# Patient Record
Sex: Female | Born: 1980 | Race: Black or African American | Hispanic: No | Marital: Single | Smoking: Former smoker
Health system: Southern US, Community
[De-identification: ages and names within clinical notes are randomized; demographics above are authoritative.]

## PROBLEM LIST (undated history)

## (undated) ENCOUNTER — Inpatient Hospital Stay (HOSPITAL_COMMUNITY): Payer: Self-pay

## (undated) DIAGNOSIS — Z90721 Acquired absence of ovaries, unilateral: Secondary | ICD-10-CM

## (undated) DIAGNOSIS — D573 Sickle-cell trait: Secondary | ICD-10-CM

## (undated) DIAGNOSIS — F419 Anxiety disorder, unspecified: Secondary | ICD-10-CM

## (undated) DIAGNOSIS — K429 Umbilical hernia without obstruction or gangrene: Secondary | ICD-10-CM

## (undated) DIAGNOSIS — F1210 Cannabis abuse, uncomplicated: Secondary | ICD-10-CM

## (undated) DIAGNOSIS — R188 Other ascites: Secondary | ICD-10-CM

## (undated) DIAGNOSIS — F12188 Cannabis abuse with other cannabis-induced disorder: Secondary | ICD-10-CM

## (undated) HISTORY — PX: HERNIA REPAIR: SHX51

## (undated) HISTORY — PX: DILATION AND CURETTAGE OF UTERUS: SHX78

---

## 2006-09-12 ENCOUNTER — Emergency Department (HOSPITAL_COMMUNITY): Admission: EM | Admit: 2006-09-12 | Discharge: 2006-09-12 | Payer: Self-pay | Admitting: Emergency Medicine

## 2008-07-11 ENCOUNTER — Ambulatory Visit: Payer: Self-pay | Admitting: Obstetrics and Gynecology

## 2008-07-11 ENCOUNTER — Inpatient Hospital Stay (HOSPITAL_COMMUNITY): Admission: AD | Admit: 2008-07-11 | Discharge: 2008-07-11 | Payer: Self-pay | Admitting: Obstetrics & Gynecology

## 2008-07-12 ENCOUNTER — Inpatient Hospital Stay (HOSPITAL_COMMUNITY): Admission: AD | Admit: 2008-07-12 | Discharge: 2008-07-13 | Payer: Self-pay | Admitting: Obstetrics & Gynecology

## 2008-07-17 ENCOUNTER — Inpatient Hospital Stay (HOSPITAL_COMMUNITY): Admission: AD | Admit: 2008-07-17 | Discharge: 2008-07-17 | Payer: Self-pay | Admitting: Obstetrics & Gynecology

## 2008-12-01 ENCOUNTER — Inpatient Hospital Stay (HOSPITAL_COMMUNITY): Admission: AD | Admit: 2008-12-01 | Discharge: 2008-12-01 | Payer: Self-pay | Admitting: Obstetrics & Gynecology

## 2008-12-14 ENCOUNTER — Inpatient Hospital Stay (HOSPITAL_COMMUNITY): Admission: AD | Admit: 2008-12-14 | Discharge: 2008-12-14 | Payer: Self-pay | Admitting: Obstetrics & Gynecology

## 2008-12-15 ENCOUNTER — Inpatient Hospital Stay (HOSPITAL_COMMUNITY): Admission: AD | Admit: 2008-12-15 | Discharge: 2008-12-15 | Payer: Self-pay | Admitting: Obstetrics & Gynecology

## 2008-12-16 ENCOUNTER — Other Ambulatory Visit: Payer: Self-pay | Admitting: Emergency Medicine

## 2008-12-16 ENCOUNTER — Ambulatory Visit: Payer: Self-pay | Admitting: Family Medicine

## 2008-12-16 ENCOUNTER — Inpatient Hospital Stay (HOSPITAL_COMMUNITY): Admission: AD | Admit: 2008-12-16 | Discharge: 2008-12-20 | Payer: Self-pay | Admitting: Obstetrics & Gynecology

## 2009-02-22 ENCOUNTER — Ambulatory Visit (HOSPITAL_COMMUNITY): Admission: RE | Admit: 2009-02-22 | Discharge: 2009-02-22 | Payer: Self-pay | Admitting: Obstetrics

## 2009-07-13 ENCOUNTER — Inpatient Hospital Stay (HOSPITAL_COMMUNITY): Admission: AD | Admit: 2009-07-13 | Discharge: 2009-07-15 | Payer: Self-pay | Admitting: Obstetrics

## 2010-04-24 IMAGING — US US OB COMP LESS 14 WK
1 series · 14 of 27 positions shown · non-contrast
Comparison: none

CLINICAL DATA: 27-year-old female nausea vomiting, 6 weeks pregnant

OBSTETRIC <14 WK US AND TRANSVAGINAL OB US
TECHNIQUE: Both transabdominal and transvaginal ultrasound
examinations were performed for complete evaluation of the
gestation as well as the maternal uterus, adnexal regions, and
pelvic cul-de-sac.

[Series 1: us ob comp less 14 wks · 14 of 27 slices shown]
[im 1/27]
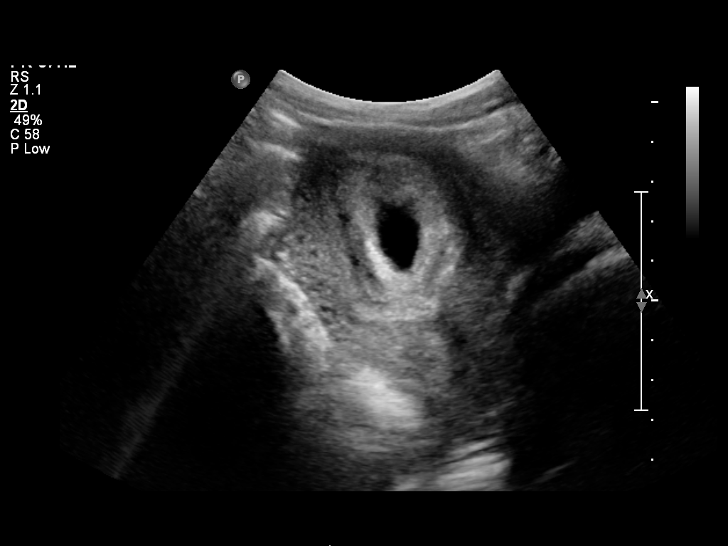
[im 3/27]
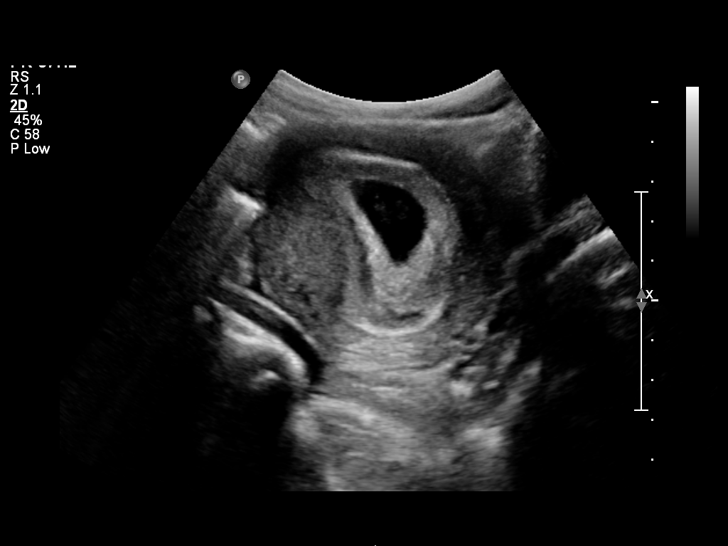
[im 5/27]
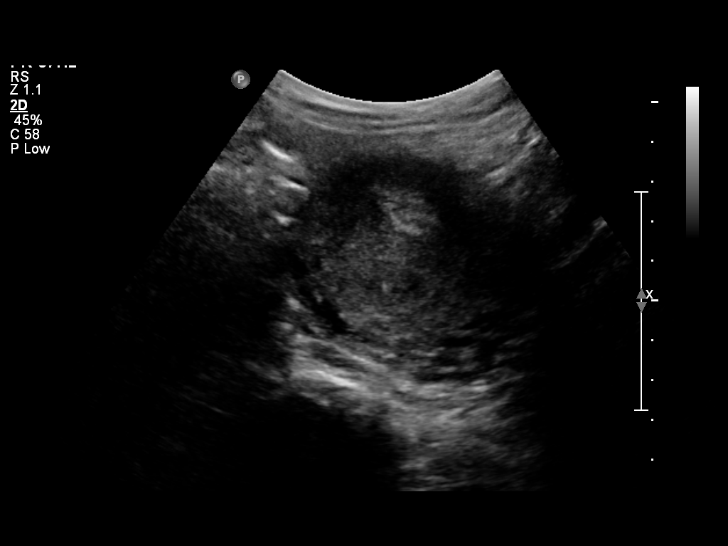
[im 7/27]
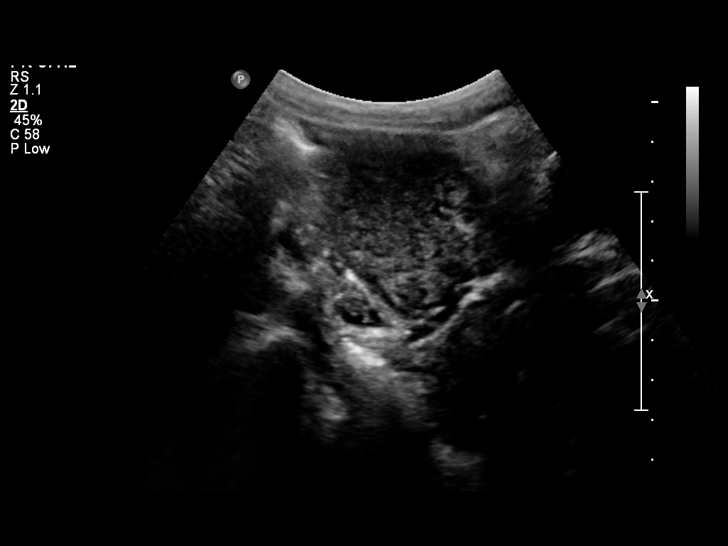
[im 9/27]
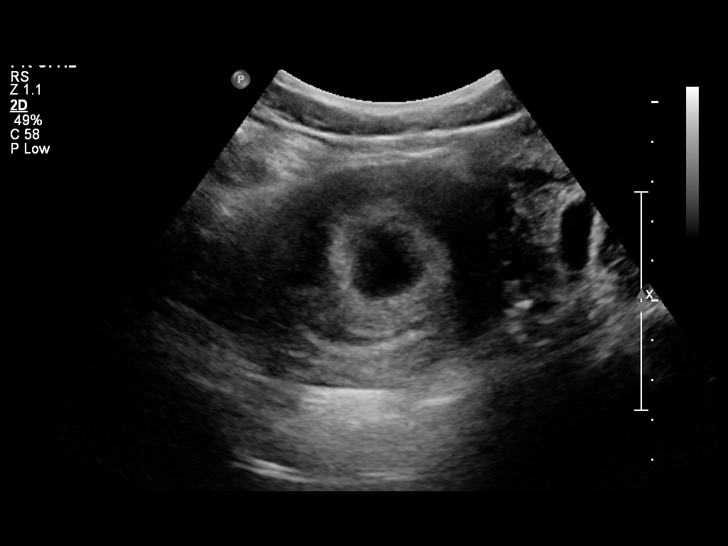
[im 11/27]
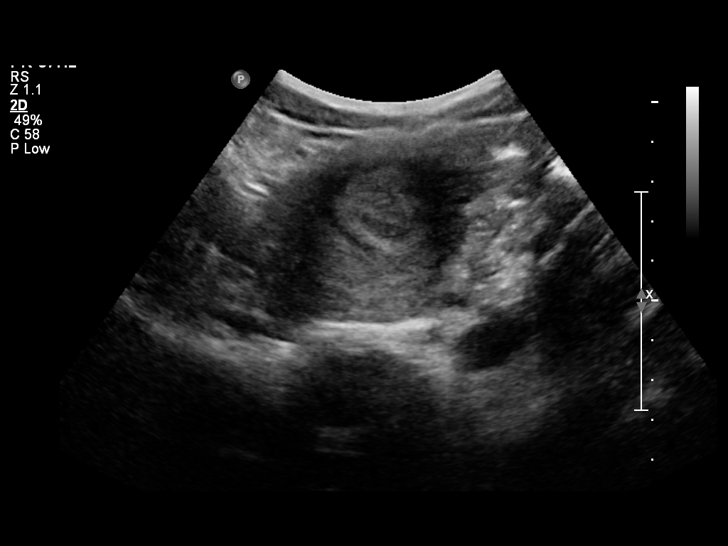
[im 13/27]
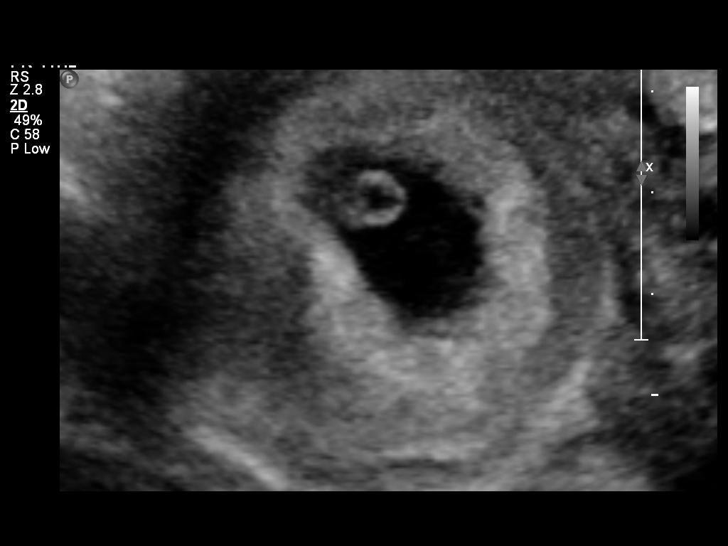
[im 15/27]
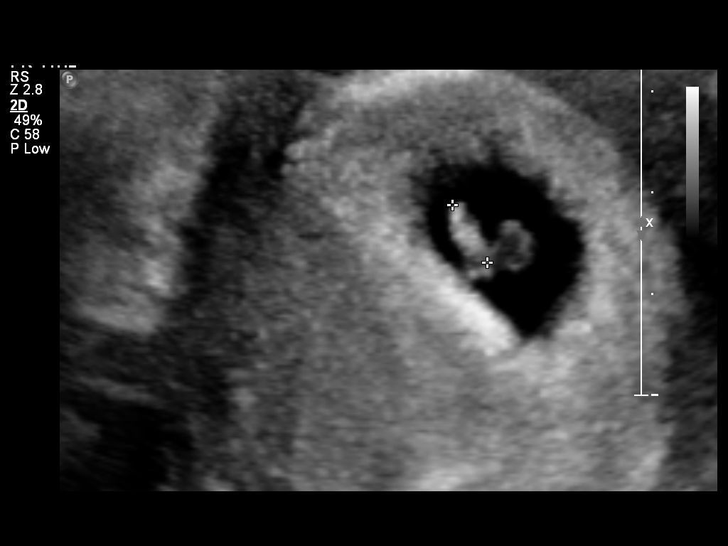
[im 17/27]
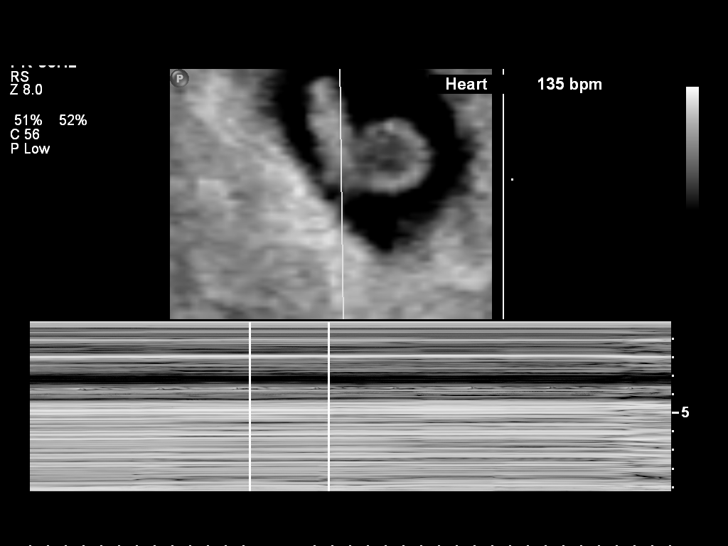
[im 19/27]
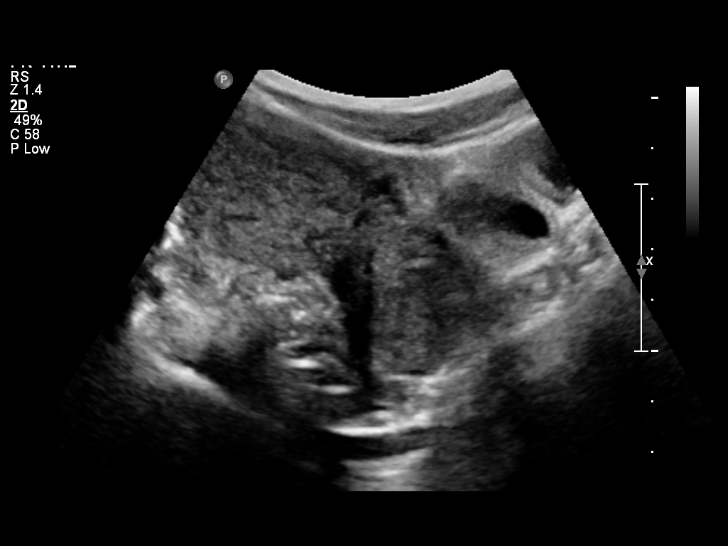
[im 21/27]
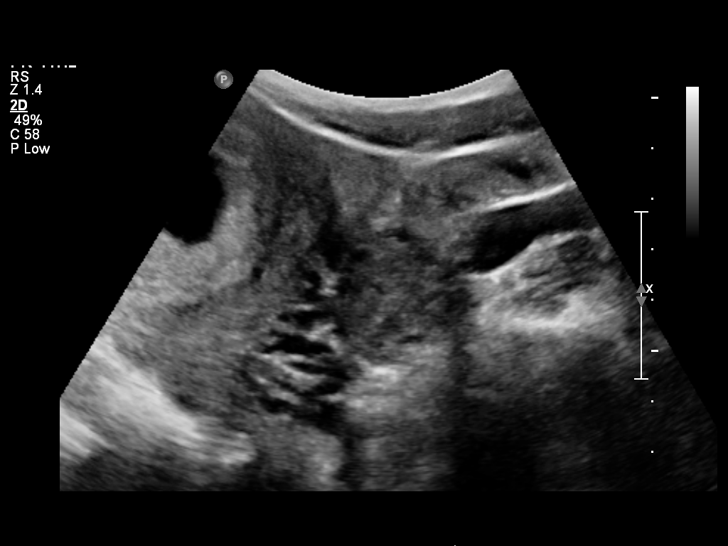
[im 23/27]
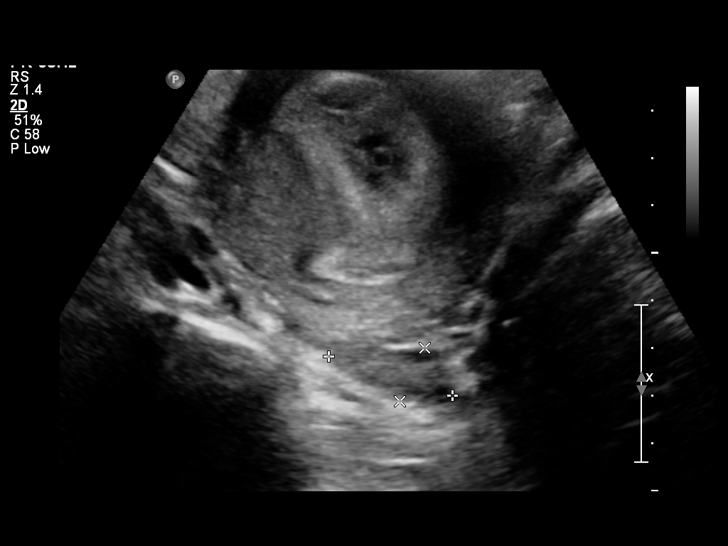
[im 25/27]
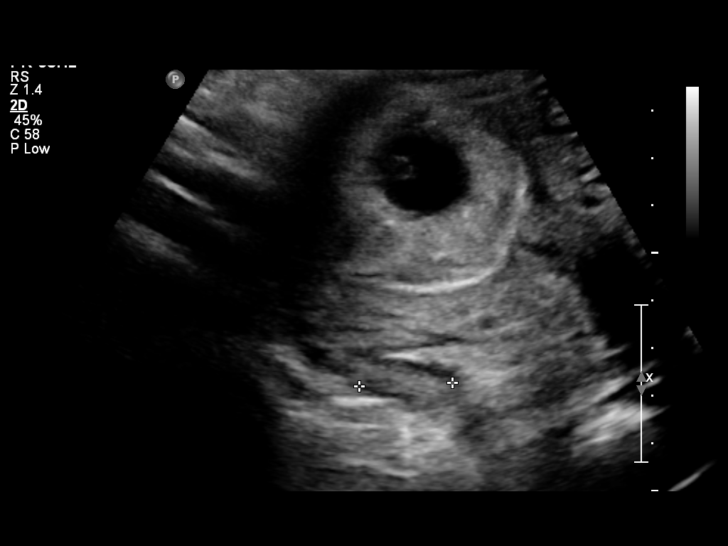
[im 27/27]
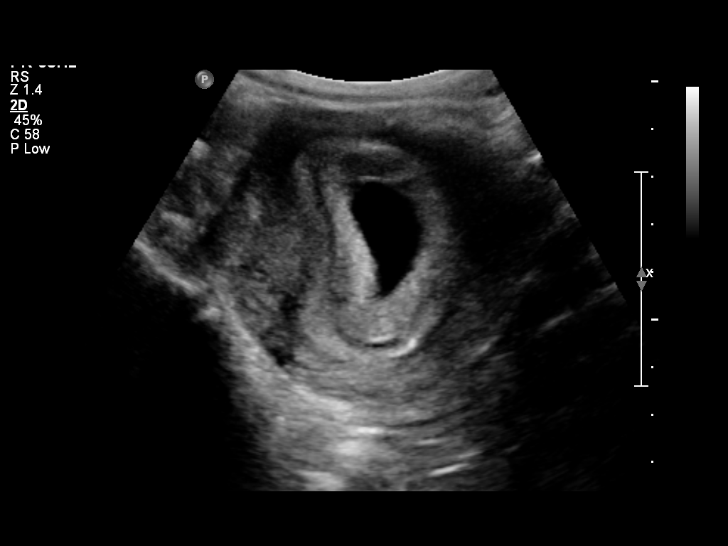

[14 of 27 positions shown; findings below may reference images not displayed]

Intrauterine gestational sac: Single
Yolk sac: Visualized
Embryo: Visualized
Cardiac Activity: Detected
Heart Rate: 135 bpm

CRL: 6.9 mm           6   w  4   d

Maternal uterus/adnexae:
A single intrauterine gestational sac is identified containing a
yolk sac and a small fetal pole.  Crown-rump length is 6.9 mm
correlating with a 6-week-4-day distention range.  Cardiac activity
was visualized during the study with a heart rate of 135 beats per
minute.  Negative for subchorionic hemorrhage.  Ovaries are normal.
No free fluid.
IMPRESSION: Single living 6-week-4-day intrauterine pregnancy.  No acute
finding by ultrasound.

## 2010-06-08 NOTE — L&D Delivery Note (Signed)
Delivery Note At  a viable and healthy female was delivered vaginally (Position: Left occiput anterior).  APGAR: 9/9 ; weight .   Placenta status: Intact, spontaneous.  Cord: 3 vessel cord,  with the following complications:none. Anesthesia:  none Episiotomy: none Lacerations: none Est. Blood Loss (mL):  Mom to postpartum.  Baby to nursery-stable.  [name] CNM 05/06/2011, 8:27 PM

## 2010-08-16 LAB — HIV ANTIBODY (ROUTINE TESTING W REFLEX): HIV: NONREACTIVE

## 2010-08-16 LAB — RPR: RPR: NONREACTIVE

## 2010-08-27 LAB — CBC
HCT: 35.5 % — ABNORMAL LOW (ref 36.0–46.0)
HCT: 40.2 % (ref 36.0–46.0)
Hemoglobin: 12 g/dL (ref 12.0–15.0)
Hemoglobin: 13.8 g/dL (ref 12.0–15.0)
MCHC: 33.9 g/dL (ref 30.0–36.0)
MCV: 96.4 fL (ref 78.0–100.0)
MCV: 96.6 fL (ref 78.0–100.0)
RBC: 3.68 MIL/uL — ABNORMAL LOW (ref 3.87–5.11)
RBC: 4.17 MIL/uL (ref 3.87–5.11)
RDW: 12.9 % (ref 11.5–15.5)
WBC: 8.2 10*3/uL (ref 4.0–10.5)

## 2010-09-13 IMAGING — US US OB COMP LESS 14 WK
1 series · 14 of 27 positions shown · non-contrast
Comparison: Pelvic ultrasound 07/12/2008

CLINICAL DATA: Vaginal bleeding, positive home pregnancy test.

OBSTETRIC <14 WK ULTRASOUND
TECHNIQUE: Transabdominal ultrasound was performed for evaluation
of the gestation as well as the maternal uterus and adnexal
regions.

[Series 1: us ob comp less 14 wks · 0.20mm/px · 14 of 27 slices shown]
[im 1/27]
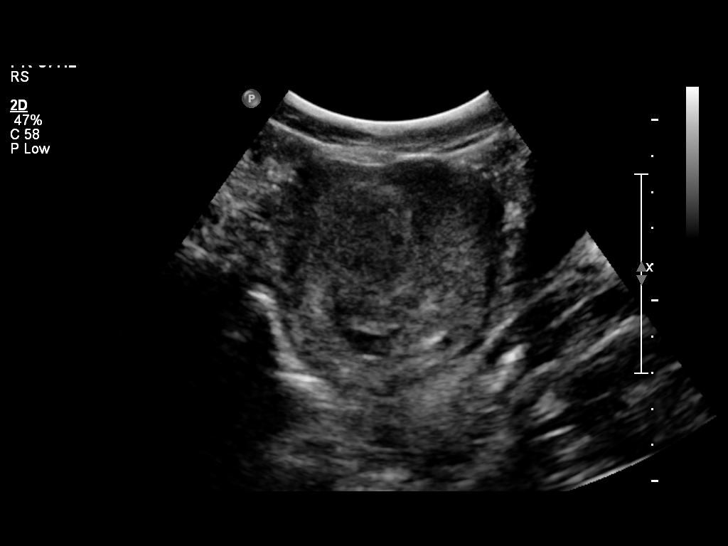
[im 3/27]
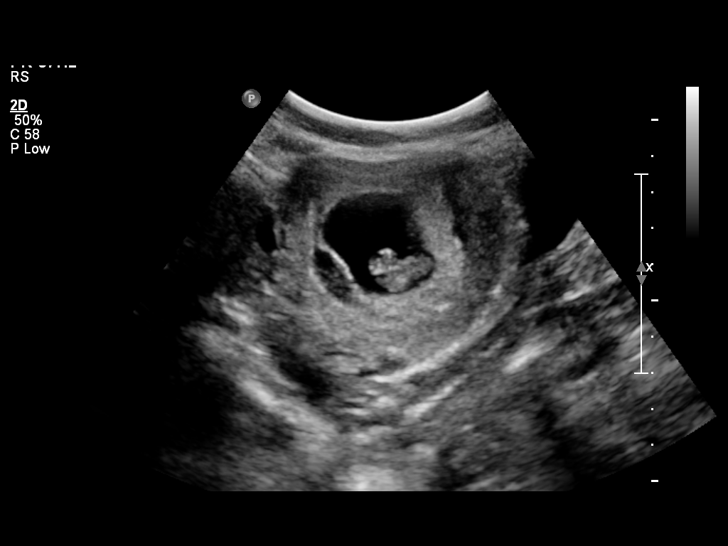
[im 5/27]
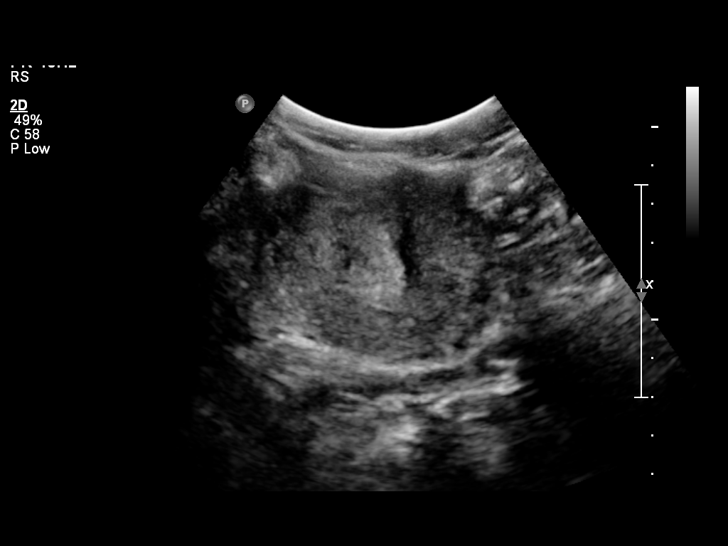
[im 7/27]
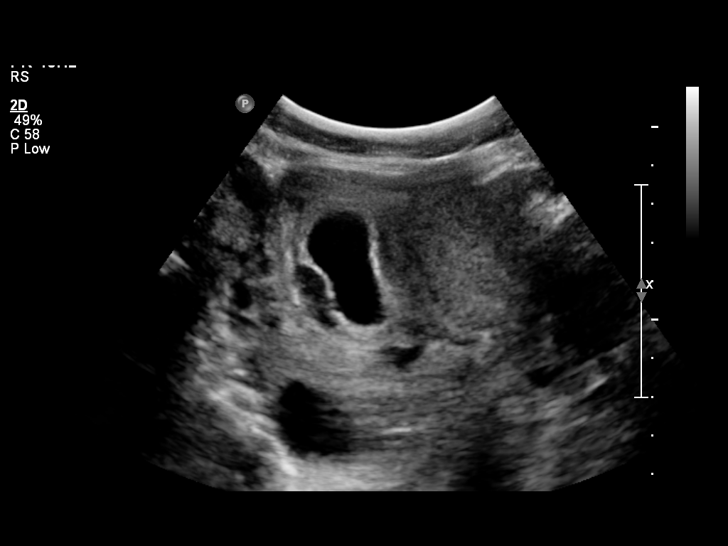
[im 9/27]
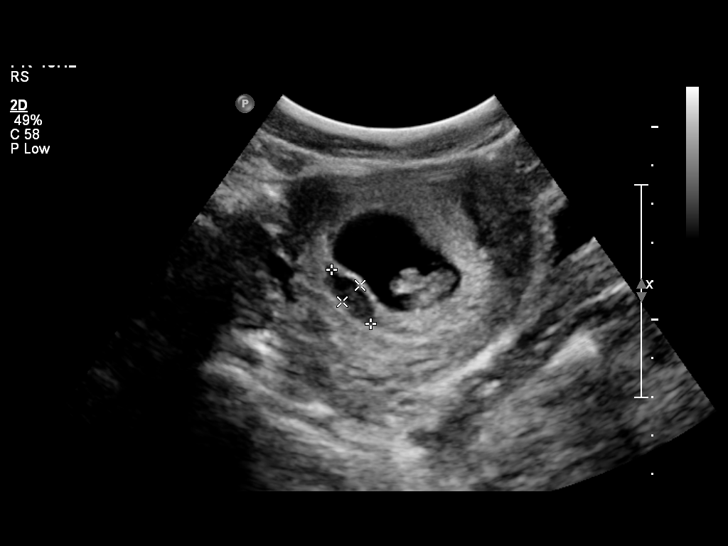
[im 11/27]
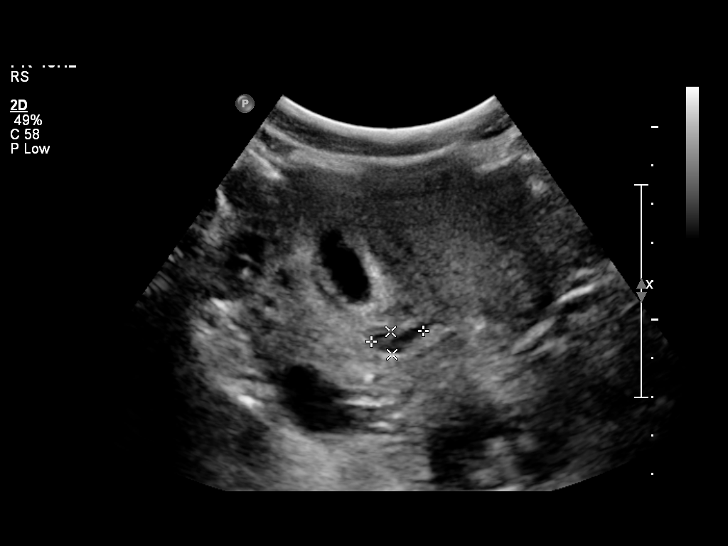
[im 13/27]
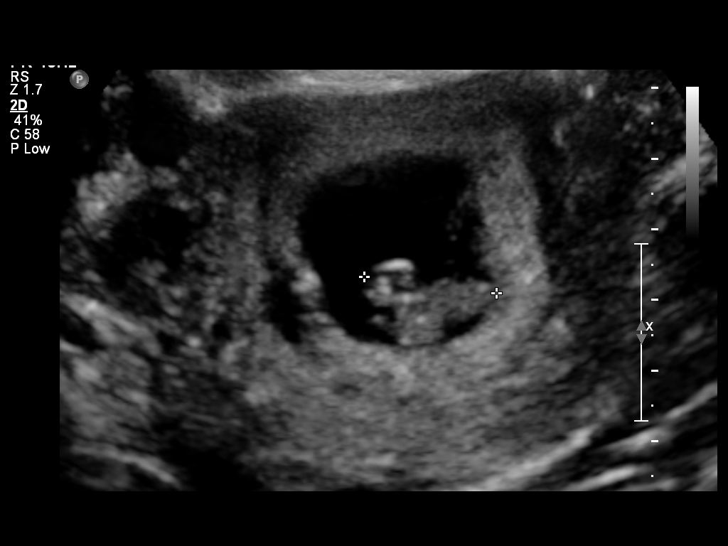
[im 15/27]
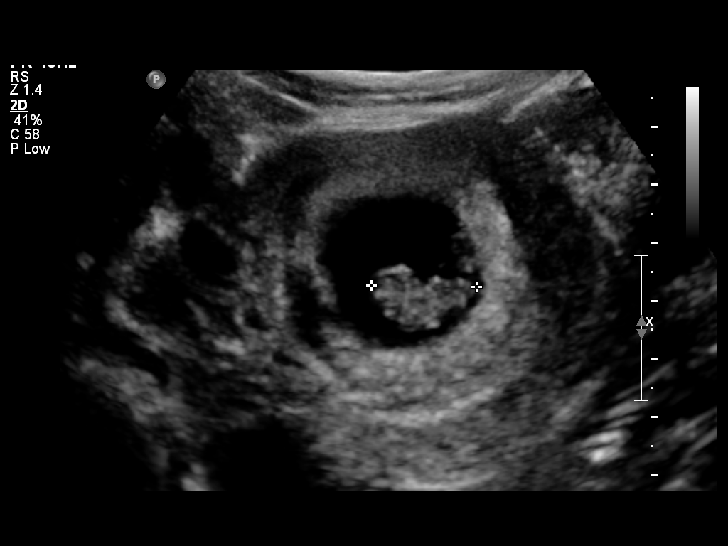
[im 17/27]
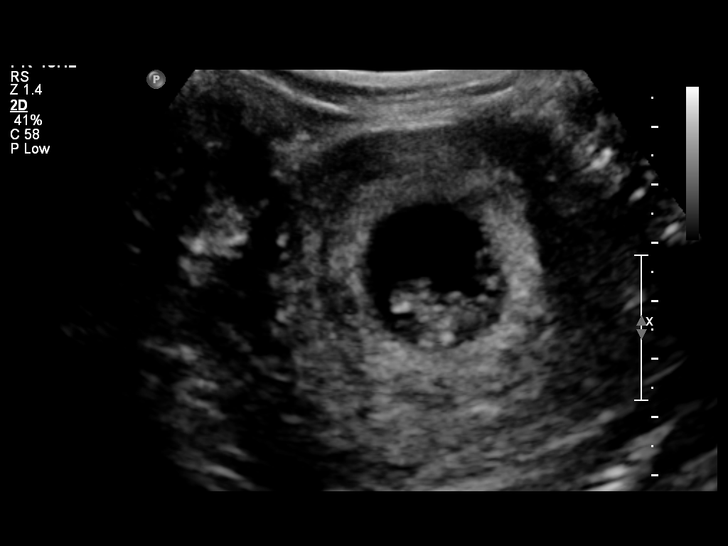
[im 19/27]
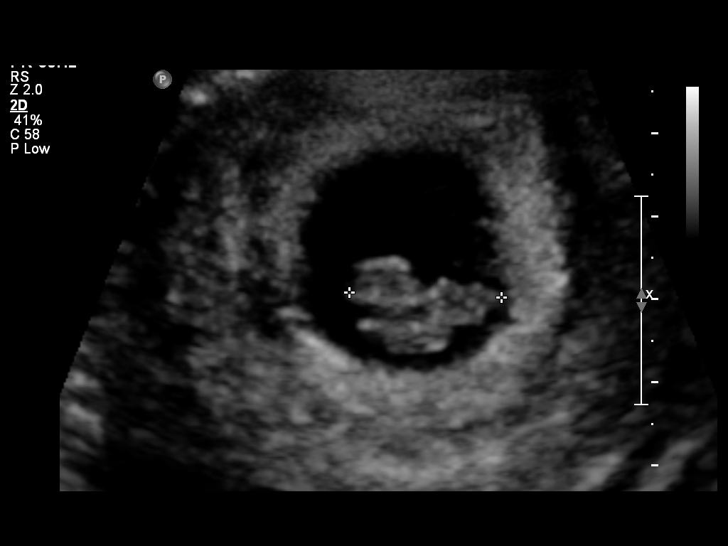
[im 21/27]
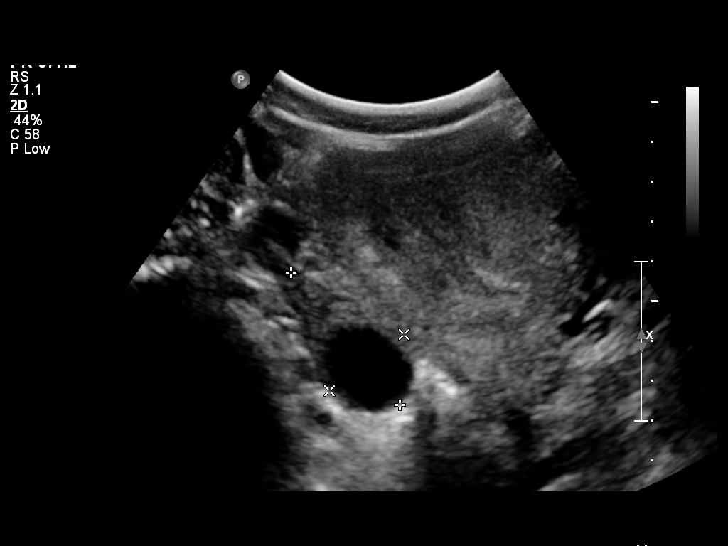
[im 23/27]
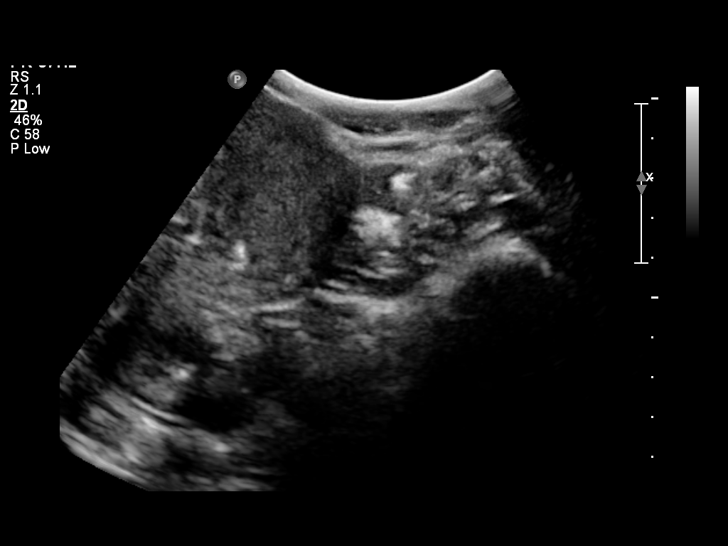
[im 25/27]
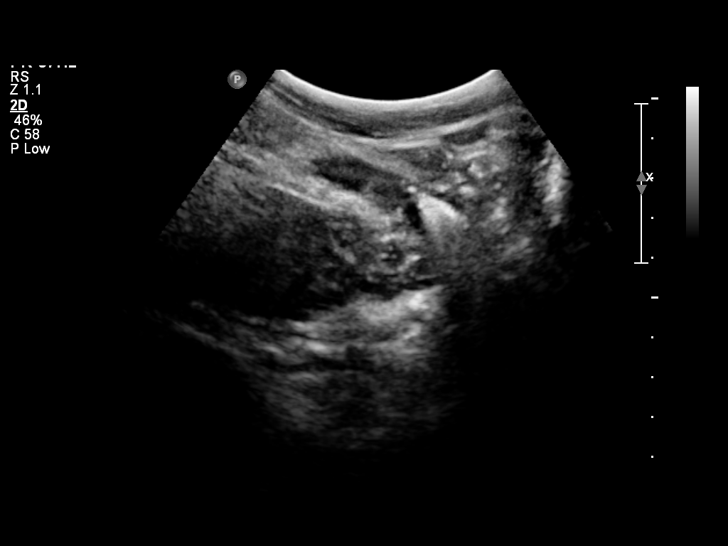
[im 27/27]
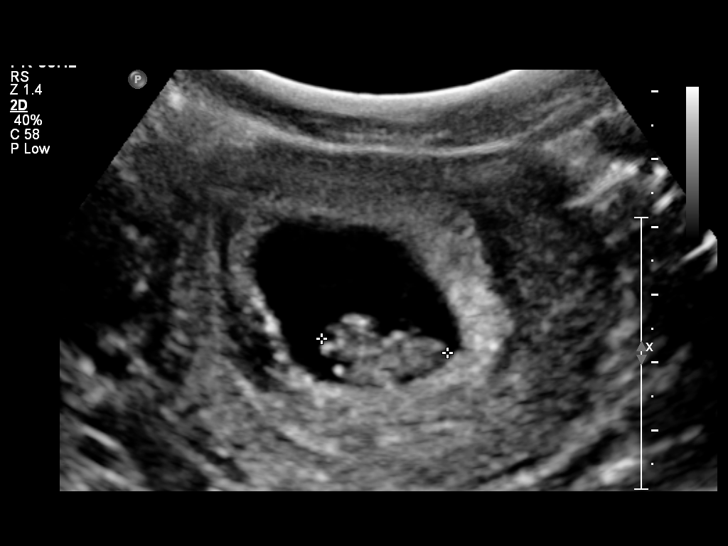

[14 of 27 positions shown; findings below may reference images not displayed]

Intrauterine gestational sac: Present
Yolk sac: Present
Embryo: Present
Cardiac Activity: Present
Heart Rate: 179 bpm

There is a small 1.7 x 0.6 cm subchorionic hemorrhage.

CRL:  18.8 mm        8w  2d

Maternal uterus/Adnexae:
A small corpus luteum noted in the right ovary.  Left ovary not
visualized.
IMPRESSION: 1.  Single intrauterine gestational sac with embryo and normal
cardiac activity.
2.  Estimated gestational age by crown-rump length equals 8 weeks 2
days.
3.  Small subchorionic hemorrhage.

## 2010-09-14 LAB — BASIC METABOLIC PANEL
BUN: 1 mg/dL — ABNORMAL LOW (ref 6–23)
BUN: <1 mg/dL — ABNORMAL LOW (ref 6–23)
CO2: 19 mEq/L (ref 19–32)
CO2: 23 mEq/L (ref 19–32)
CO2: 23 mEq/L (ref 19–32)
CO2: 24 mEq/L (ref 19–32)
CO2: 26 mEq/L (ref 19–32)
Calcium: 7.7 mg/dL — ABNORMAL LOW (ref 8.4–10.5)
Chloride: 103 mEq/L (ref 96–112)
Chloride: 104 mEq/L (ref 96–112)
Chloride: 106 mEq/L (ref 96–112)
Creatinine, Ser: 0.48 mg/dL (ref 0.4–1.2)
Creatinine, Ser: 0.59 mg/dL (ref 0.4–1.2)
GFR calc Af Amer: >60 mL/min (ref >60)
Glucose, Bld: 103 mg/dL — ABNORMAL HIGH (ref 70–99)
Glucose, Bld: 104 mg/dL — ABNORMAL HIGH (ref 70–99)
Glucose, Bld: 104 mg/dL — ABNORMAL HIGH (ref 70–99)
Glucose, Bld: 107 mg/dL — ABNORMAL HIGH (ref 70–99)
Glucose, Bld: 214 mg/dL — ABNORMAL HIGH (ref 70–99)
Potassium: 2.5 mEq/L — CL (ref 3.5–5.1)
Potassium: 2.8 mEq/L — ABNORMAL LOW (ref 3.5–5.1)
Sodium: 133 mEq/L — ABNORMAL LOW (ref 135–145)
Sodium: 136 mEq/L (ref 135–145)
Sodium: 137 mEq/L (ref 135–145)

## 2010-09-14 LAB — COMPREHENSIVE METABOLIC PANEL
ALT: 14 U/L (ref 0–35)
AST: 20 U/L (ref 0–37)
Albumin: 4.1 g/dL (ref 3.5–5.2)
Chloride: 101 mEq/L (ref 96–112)
Creatinine, Ser: 0.54 mg/dL (ref 0.4–1.2)
GFR calc Af Amer: >60 mL/min (ref >60)
Sodium: 135 mEq/L (ref 135–145)
Total Bilirubin: 0.8 mg/dL (ref 0.3–1.2)

## 2010-09-14 LAB — CBC
Hemoglobin: 11.9 g/dL — ABNORMAL LOW (ref 12.0–15.0)
MCHC: 35.4 g/dL (ref 30.0–36.0)
RDW: 12.6 % (ref 11.5–15.5)

## 2010-09-14 LAB — URINE MICROSCOPIC-ADD ON

## 2010-09-14 LAB — DIFFERENTIAL
Basophils Absolute: 0 10*3/uL (ref 0.0–0.1)
Basophils Relative: 0 % (ref 0–1)
Monocytes Absolute: 0.7 10*3/uL (ref 0.1–1.0)
Neutro Abs: 10.9 10*3/uL — ABNORMAL HIGH (ref 1.7–7.7)

## 2010-09-14 LAB — URINALYSIS, ROUTINE W REFLEX MICROSCOPIC
Bilirubin Urine: NEGATIVE
Bilirubin Urine: NEGATIVE
Glucose, UA: NEGATIVE mg/dL
Hgb urine dipstick: NEGATIVE
Ketones, ur: 15 mg/dL — AB
Ketones, ur: >80 mg/dL — AB
Nitrite: NEGATIVE
Protein, ur: 30 mg/dL — AB
Protein, ur: 30 mg/dL — AB
Specific Gravity, Urine: 1.03 (ref 1.005–1.030)
Urobilinogen, UA: 1 mg/dL (ref 0.0–1.0)

## 2010-09-14 LAB — POCT PREGNANCY, URINE: Preg Test, Ur: POSITIVE

## 2010-09-15 LAB — URINALYSIS, ROUTINE W REFLEX MICROSCOPIC
Leukocytes, UA: NEGATIVE
Nitrite: NEGATIVE
Specific Gravity, Urine: 1.02 (ref 1.005–1.030)
pH: 6.5 (ref 5.0–8.0)

## 2010-09-15 LAB — WET PREP, GENITAL
Trich, Wet Prep: NONE SEEN
Yeast Wet Prep HPF POC: NONE SEEN

## 2010-09-15 LAB — CBC
MCV: 94.1 fL (ref 78.0–100.0)
Platelets: 264 10*3/uL (ref 150–400)
RDW: 12.2 % (ref 11.5–15.5)
WBC: 9.5 10*3/uL (ref 4.0–10.5)

## 2010-09-15 LAB — ABO/RH: ABO/RH(D): O POS

## 2010-09-15 LAB — HCG, QUANTITATIVE, PREGNANCY: hCG, Beta Chain, Quant, S: 111778 m[IU]/mL — ABNORMAL HIGH (ref <5)

## 2010-09-15 LAB — URINE MICROSCOPIC-ADD ON

## 2010-09-23 LAB — URINALYSIS, ROUTINE W REFLEX MICROSCOPIC
Bilirubin Urine: NEGATIVE
Glucose, UA: NEGATIVE mg/dL
Glucose, UA: NEGATIVE mg/dL
Hgb urine dipstick: NEGATIVE
Ketones, ur: 15 mg/dL — AB
Ketones, ur: >80 mg/dL — AB
Leukocytes, UA: NEGATIVE
Leukocytes, UA: NEGATIVE
Nitrite: NEGATIVE
Specific Gravity, Urine: >1.03 — ABNORMAL HIGH (ref 1.005–1.030)
Specific Gravity, Urine: >1.03 — ABNORMAL HIGH (ref 1.005–1.030)
Urobilinogen, UA: 0.2 mg/dL (ref 0.0–1.0)
pH: 6 (ref 5.0–8.0)
pH: 6 (ref 5.0–8.0)
pH: 7.5 (ref 5.0–8.0)

## 2010-09-23 LAB — URINE MICROSCOPIC-ADD ON: RBC / HPF: NONE SEEN RBC/hpf (ref <3)

## 2010-09-23 LAB — GC/CHLAMYDIA PROBE AMP, GENITAL
Chlamydia, DNA Probe: NEGATIVE
GC Probe Amp, Genital: NEGATIVE

## 2010-09-23 LAB — WET PREP, GENITAL

## 2010-10-09 ENCOUNTER — Emergency Department (HOSPITAL_COMMUNITY)
Admission: EM | Admit: 2010-10-09 | Discharge: 2010-10-09 | Disposition: A | Discharge status: Home / Self Care | Payer: Medicaid Other | Attending: Emergency Medicine | Admitting: Emergency Medicine

## 2010-10-09 ENCOUNTER — Emergency Department (HOSPITAL_COMMUNITY): Payer: Medicaid Other

## 2010-10-09 ENCOUNTER — Other Ambulatory Visit (HOSPITAL_COMMUNITY): Payer: Self-pay

## 2010-10-09 DIAGNOSIS — O2390 Unspecified genitourinary tract infection in pregnancy, unspecified trimester: Secondary | ICD-10-CM | POA: Insufficient documentation

## 2010-10-09 DIAGNOSIS — A499 Bacterial infection, unspecified: Secondary | ICD-10-CM | POA: Insufficient documentation

## 2010-10-09 DIAGNOSIS — N760 Acute vaginitis: Secondary | ICD-10-CM | POA: Insufficient documentation

## 2010-10-09 DIAGNOSIS — O219 Vomiting of pregnancy, unspecified: Secondary | ICD-10-CM | POA: Insufficient documentation

## 2010-10-09 DIAGNOSIS — B9689 Other specified bacterial agents as the cause of diseases classified elsewhere: Secondary | ICD-10-CM | POA: Insufficient documentation

## 2010-10-09 LAB — URINALYSIS, ROUTINE W REFLEX MICROSCOPIC
Bilirubin Urine: NEGATIVE
Nitrite: NEGATIVE
Specific Gravity, Urine: 1.028 (ref 1.005–1.030)
Urobilinogen, UA: 1 mg/dL (ref 0.0–1.0)
pH: 6 (ref 5.0–8.0)

## 2010-10-09 LAB — POCT I-STAT, CHEM 8
Chloride: 105 mEq/L (ref 96–112)
Glucose, Bld: 83 mg/dL (ref 70–99)
HCT: 39 % (ref 36.0–46.0)
Hemoglobin: 13.3 g/dL (ref 12.0–15.0)
Potassium: 3.7 mEq/L (ref 3.5–5.1)
Sodium: 137 mEq/L (ref 135–145)

## 2010-10-09 LAB — WET PREP, GENITAL

## 2010-10-09 LAB — HCG, QUANTITATIVE, PREGNANCY: hCG, Beta Chain, Quant, S: 73939 m[IU]/mL — ABNORMAL HIGH (ref <5)

## 2010-10-10 LAB — GC/CHLAMYDIA PROBE AMP, GENITAL
Chlamydia, DNA Probe: NEGATIVE
GC Probe Amp, Genital: NEGATIVE

## 2010-10-21 NOTE — Discharge Summary (Signed)
Veronica Green, Veronica Green                ACCOUNT NO.:  [id number]   MEDICAL RECORD NO.:  [id number]          PATIENT TYPE:  INP   LOCATION:  9305                          FACILITY:  [facility]   PHYSICIAN:  Lesly Dukes, M.D. DATE OF BIRTH:  Mar 21, 1981   DATE OF ADMISSION:  12/16/2008  DATE OF DISCHARGE:  12/20/2008                               DISCHARGE SUMMARY   DISCHARGE DIAGNOSES:  1. Hyperemesis gravidarum.  2. Intrauterine pregnancy at 54 weeks' gestational age.  3. Hypokalemia.  4. Constipation.   REASON FOR ADMISSION:  [name] is a 30 year old, gravida 3,  para 1, admitted at 9 plus weeks' gestational age with persistent  nausea, vomiting, and inability to tolerate p.o.'s.  On admission, she  had marked ketones on her urinalysis and she had a serum potassium of  2.8.  The patient was admitted for IV hydration, medical management of  her hyperemesis, as well as potassium replacement.   HOSPITAL COURSE:  The patient was admitted, given IV fluids, initially  with glucose and then just regular lactated Ringer solution.  She also  had received IV as well as p.o. potassium replacement, and she was  treated with Reglan, Zofran, scopolamine, and Robamol.  The patient  refused her meds on occasion in the hospital, and she felt that the best  meds for her were the Zofran.  Additionally, steroids were started on  hospital day #3 which the patient found to be markedly helpful.  Shortly  after starting steroids, the patient's diet was advanced, and she was  tolerating both liquids and solids.  Of note during the hospitalization,  she received a psychiatry consult due to her history of anxiety.  The  diagnosis was a somatoform disorder and anxiety disorder, and the  recommendations were psychotherapy.  At the time of discharge, the  patient is in much better spirits.  She is tolerating p.o.'s as stated  above and is eager to go home.   MEDICATIONS AT DISCHARGE:  1. Zofran oral  dissolving tablets 8 mg.  2. Methylprednisolone a 2-week-long taper as per the instructions      given to her.  3. Colace over the counter twice daily.  4. MiraLax over the counter once daily.   INSTRUCTIONS TO THE PATIENT:  The patient was instructed to very slowly  advance her diet as well as stick to small portions of liquid and solid.  Additionally, she was instructed on the use of her medications.  The  patient does have an appointment at the health department in  approximately 1-1/2 weeks.  She was instructed to follow up with them or  followup in the MAU sooner if she has further problems.  Additionally,  we will have social worker see the patient today to try to range some  beginning outpatient psychotherapy.   PROCEDURES:  None.   FINDINGS:  As per above.   CONDITION ON DISCHARGE:  Good.   DISPOSITION:  The patient discharged to home.      Odie Sera, DO      ______________________________  Lesly Dukes, M.D.  MC/MEDQ  D:  12/20/2008  T:  12/20/2008  Job:  [id number]

## 2011-03-17 LAB — ANTIBODY SCREEN: Antibody Screen: NEGATIVE

## 2011-04-20 LAB — STREP B DNA PROBE: GBS: NEGATIVE

## 2011-05-06 ENCOUNTER — Encounter (HOSPITAL_COMMUNITY): Payer: Self-pay | Admitting: *Deleted

## 2011-05-06 ENCOUNTER — Inpatient Hospital Stay (HOSPITAL_COMMUNITY)
Admission: AD | Admit: 2011-05-06 | Discharge: 2011-05-08 | DRG: 775 | Disposition: A | Discharge status: Home / Self Care | Payer: Medicaid Other | Source: Ambulatory Visit | Attending: Obstetrics | Admitting: Obstetrics

## 2011-05-06 DIAGNOSIS — O4290 Premature rupture of membranes, unspecified as to length of time between rupture and onset of labor, unspecified weeks of gestation: Principal | ICD-10-CM | POA: Diagnosis present

## 2011-05-06 LAB — CBC
MCH: 31.3 pg (ref 26.0–34.0)
MCHC: 35.1 g/dL (ref 30.0–36.0)
MCV: 88.9 fL (ref 78.0–100.0)
Platelets: 247 10*3/uL (ref 150–400)
RDW: 12.3 % (ref 11.5–15.5)

## 2011-05-06 MED ORDER — BENZOCAINE-MENTHOL 20-0.5 % EX AERO
1.0000 "application " | INHALATION_SPRAY | CUTANEOUS | Status: DC | PRN
  Administered 2011-05-06: 1 via TOPICAL

## 2011-05-06 MED ORDER — OXYCODONE-ACETAMINOPHEN 5-325 MG PO TABS: 2.0000 | ORAL_TABLET | ORAL | Status: DC | PRN

## 2011-05-06 MED ORDER — LIDOCAINE HCL (PF) 1 % IJ SOLN
30.0000 mL | INTRAMUSCULAR | Status: DC | PRN
  Filled 2011-05-06: qty 30

## 2011-05-06 MED ORDER — LANOLIN HYDROUS EX OINT: TOPICAL_OINTMENT | CUTANEOUS | Status: DC | PRN

## 2011-05-06 MED ORDER — DIBUCAINE 1 % RE OINT: 1.0000 "application " | TOPICAL_OINTMENT | RECTAL | Status: DC | PRN

## 2011-05-06 MED ORDER — LACTATED RINGERS IV SOLN
INTRAVENOUS | Status: DC
  Administered 2011-05-06: 15:00:00 via INTRAVENOUS

## 2011-05-06 MED ORDER — SENNOSIDES-DOCUSATE SODIUM 8.6-50 MG PO TABS
2.0000 | ORAL_TABLET | Freq: Every day | ORAL | Status: DC
  Administered 2011-05-07: 2 via ORAL

## 2011-05-06 MED ORDER — ONDANSETRON HCL 4 MG/2ML IJ SOLN: 4.0000 mg | Freq: Four times a day (QID) | INTRAMUSCULAR | Status: DC | PRN

## 2011-05-06 MED ORDER — TERBUTALINE SULFATE 1 MG/ML IJ SOLN: 0.2500 mg | Freq: Once | INTRAMUSCULAR | Status: DC | PRN

## 2011-05-06 MED ORDER — LACTATED RINGERS IV SOLN: 500.0000 mL | INTRAVENOUS | Status: DC | PRN

## 2011-05-06 MED ORDER — ZOLPIDEM TARTRATE 5 MG PO TABS: 5.0000 mg | ORAL_TABLET | Freq: Every evening | ORAL | Status: DC | PRN

## 2011-05-06 MED ORDER — IBUPROFEN 600 MG PO TABS: 600.0000 mg | ORAL_TABLET | Freq: Four times a day (QID) | ORAL | Status: DC | PRN

## 2011-05-06 MED ORDER — BENZOCAINE-MENTHOL 20-0.5 % EX AERO
INHALATION_SPRAY | CUTANEOUS | Status: AC
  Administered 2011-05-06: 1 via TOPICAL
  Filled 2011-05-06: qty 56

## 2011-05-06 MED ORDER — DIPHENHYDRAMINE HCL 25 MG PO CAPS: 25.0000 mg | ORAL_CAPSULE | Freq: Four times a day (QID) | ORAL | Status: DC | PRN

## 2011-05-06 MED ORDER — TETANUS-DIPHTH-ACELL PERTUSSIS 5-2.5-18.5 LF-MCG/0.5 IM SUSP: 0.5000 mL | Freq: Once | INTRAMUSCULAR | Status: DC

## 2011-05-06 MED ORDER — MISOPROSTOL 200 MCG PO TABS
ORAL_TABLET | ORAL | Status: AC
  Filled 2011-05-06: qty 5

## 2011-05-06 MED ORDER — LACTATED RINGERS IV SOLN: INTRAVENOUS | Status: DC

## 2011-05-06 MED ORDER — MEDROXYPROGESTERONE ACETATE 150 MG/ML IM SUSP
150.0000 mg | INTRAMUSCULAR | Status: AC | PRN
  Administered 2011-05-08: 150 mg via INTRAMUSCULAR
  Filled 2011-05-06: qty 1

## 2011-05-06 MED ORDER — PRENATAL PLUS 27-1 MG PO TABS
1.0000 | ORAL_TABLET | Freq: Every day | ORAL | Status: DC
  Administered 2011-05-07 – 2011-05-08 (×2): 1 via ORAL
  Filled 2011-05-06 (×2): qty 1

## 2011-05-06 MED ORDER — OXYCODONE-ACETAMINOPHEN 5-325 MG PO TABS
1.0000 | ORAL_TABLET | ORAL | Status: DC | PRN
  Administered 2011-05-07 (×3): 1 via ORAL
  Administered 2011-05-08: 2 via ORAL
  Administered 2011-05-08 (×2): 1 via ORAL
  Filled 2011-05-06 (×5): qty 1
  Filled 2011-05-06: qty 2

## 2011-05-06 MED ORDER — OXYTOCIN 20 UNITS IN LACTATED RINGERS INFUSION - SIMPLE
125.0000 mL/h | Freq: Once | INTRAVENOUS | Status: DC
  Filled 2011-05-06: qty 1000

## 2011-05-06 MED ORDER — NALOXONE HCL 0.4 MG/ML IJ SOLN
INTRAMUSCULAR | Status: AC
  Filled 2011-05-06: qty 1

## 2011-05-06 MED ORDER — WITCH HAZEL-GLYCERIN EX PADS: 1.0000 "application " | MEDICATED_PAD | CUTANEOUS | Status: DC | PRN

## 2011-05-06 MED ORDER — OXYTOCIN 20 UNITS IN LACTATED RINGERS INFUSION - SIMPLE
1.0000 m[IU]/min | INTRAVENOUS | Status: DC
  Administered 2011-05-06: 2 m[IU]/min via INTRAVENOUS

## 2011-05-06 MED ORDER — ACETAMINOPHEN 325 MG PO TABS: 650.0000 mg | ORAL_TABLET | ORAL | Status: DC | PRN

## 2011-05-06 MED ORDER — PROMETHAZINE HCL 25 MG/ML IJ SOLN
12.5000 mg | INTRAMUSCULAR | Status: DC | PRN
  Administered 2011-05-06: 12.5 mg via INTRAVENOUS
  Filled 2011-05-06: qty 1

## 2011-05-06 MED ORDER — OXYTOCIN BOLUS FROM INFUSION
500.0000 mL | Freq: Once | INTRAVENOUS | Status: AC
  Administered 2011-05-06: 500 mL via INTRAVENOUS
  Filled 2011-05-06: qty 1000
  Filled 2011-05-06: qty 500

## 2011-05-06 MED ORDER — CITRIC ACID-SODIUM CITRATE 334-500 MG/5ML PO SOLN: 30.0000 mL | ORAL | Status: DC | PRN

## 2011-05-06 MED ORDER — BUTORPHANOL TARTRATE 2 MG/ML IJ SOLN
1.0000 mg | INTRAMUSCULAR | Status: DC | PRN
  Administered 2011-05-06: 1 mg via INTRAVENOUS
  Filled 2011-05-06: qty 1

## 2011-05-06 MED ORDER — IBUPROFEN 600 MG PO TABS
600.0000 mg | ORAL_TABLET | Freq: Four times a day (QID) | ORAL | Status: DC
  Administered 2011-05-06 – 2011-05-08 (×7): 600 mg via ORAL
  Filled 2011-05-06 (×7): qty 1

## 2011-05-06 MED ORDER — FLEET ENEMA 7-19 GM/118ML RE ENEM: 1.0000 | ENEMA | RECTAL | Status: DC | PRN

## 2011-05-06 MED ORDER — SIMETHICONE 80 MG PO CHEW: 80.0000 mg | CHEWABLE_TABLET | ORAL | Status: DC | PRN

## 2011-05-06 MED ORDER — ONDANSETRON HCL 4 MG PO TABS: 4.0000 mg | ORAL_TABLET | ORAL | Status: DC | PRN

## 2011-05-06 MED ORDER — ONDANSETRON HCL 4 MG/2ML IJ SOLN: 4.0000 mg | INTRAMUSCULAR | Status: DC | PRN

## 2011-05-06 NOTE — Progress Notes (Signed)
Veronica Green is a 30 y.o. [id number] at [redacted]w[redacted]d by LMP admitted for rupture of membranes  Subjective: Pt feeling urge to push. Sleepy between contractions, had stadol at 1910.  Objective: BP 144/94  Pulse 86  Temp(Src) 98.8 F (37.1 C) (Oral)  Resp 18  Ht 5\' 3"  (1.6 m)  Wt 56.7 kg (125 lb)  BMI 22.14 kg/m2  SpO2 98%      FHT:  FHR: 135 bpm, variability: moderate,  accelerations:  Present,  decelerations:  Absent UC:   regular, every 2 minutes SVE:   Dilation: 4 Effacement (%): 80 Station: -2 Exam by:: Valentina Lucks, RN  Labs: Lab Results  Component Value Date   WBC 12.2* 05/06/2011   HGB 13.0 05/06/2011   HCT 37.0 05/06/2011   MCV 88.9 05/06/2011   PLT 247 05/06/2011    Assessment / Plan: Augmentation of labor, progressing well, Completely dilated at 0 station.   Labor: Progressing normally Preeclampsia:  no signs or symptoms of toxicity Fetal Wellbeing:  Category I Pain Control:  labor support and IVPM. I/D:  n/a Anticipated MOD:  NSVD  [name] CNM 05/06/2011, 8:15 pm

## 2011-05-06 NOTE — Progress Notes (Signed)
Veronica Green is a 30 y.o. [id number] at [redacted]w[redacted]d by LMP admitted for rupture of membranes at 0400  Subjective: Pt comfortable, irregular contractions.   Objective: BP 102/59  Pulse 89  Temp(Src) 98.8 F (37.1 C) (Oral)  Resp 18  Ht 5\' 3"  (1.6 m)  Wt 56.7 kg (125 lb)  BMI 22.14 kg/m2  SpO2 98%      FHT:  FHR: 135 bpm, variability: moderate,  accelerations:  Present,  decelerations:  Absent UC:   irregular, every 5-8 minutes SVE:   Dilation: 4 Effacement (%): 80 Station: -2 Exam by:: Dr. Gaynell Face  Labs: Lab Results  Component Value Date   WBC 12.2* 05/06/2011   HGB 13.0 05/06/2011   HCT 37.0 05/06/2011   MCV 88.9 05/06/2011   PLT 247 05/06/2011    Assessment / Plan: PROM, will augment labor with Pitocin. Consult with Dr. Tamela Oddi prn.  Labor: Start Pitocin augmentation Preeclampsia:  no signs or symptoms of toxicity Fetal Wellbeing:  Category I Pain Control:  Labor support without medications and IVPM if pt desires. I/D:  n/a Anticipated MOD:  NSVD  [name] CNM 05/06/2011, 6:02 PM

## 2011-05-06 NOTE — H&P (Signed)
This is Dr. Francoise Ceo dictating the history and physical on  Veronica Green she is a 30 year old gravida 4 para 2012 at 37 weeks and 4 days due date is 05/23/2011 negative GBS and her membranes ruptured spontaneously at 4 AM today her fluids clear and she is contracting 3-4 minutes the cervix is 4 cm 80% with the vertex at -1 to -2 station Past medical history negative Past surgical history negative Social history negative System review noncontributory Physical exam well-developed female in no distress HEENT negative Lungs clear Heart regular rhythm no murmurs no gallops Abdomen term Abdomen him in pelvic negative Extremities negative and

## 2011-05-06 NOTE — Progress Notes (Signed)
Notified of pt status, SVE, FHR, variables noted, UC pattern, and pt denies pain.  Will continue to monitor.

## 2011-05-06 NOTE — Progress Notes (Signed)
Pt in c/o small gush of clear fluid around 0300, and an additional gush around 1230 today.  Reports occasional ucs, denies any bleeding.  + FM.

## 2011-05-07 LAB — CBC
HCT: 31.7 % — ABNORMAL LOW (ref 36.0–46.0)
Hemoglobin: 11.1 g/dL — ABNORMAL LOW (ref 12.0–15.0)
WBC: 13 10*3/uL — ABNORMAL HIGH (ref 4.0–10.5)

## 2011-05-07 LAB — RPR: RPR Ser Ql: NONREACTIVE

## 2011-05-07 NOTE — Progress Notes (Signed)
Patient ID: Veronica Green, female   DOB: 1980-07-30, 30 y.o.   MRN: [id number] Postpartum day one Vital signs normal Fundus firm Lochia moderate Legs negative No complaints

## 2011-05-07 NOTE — Progress Notes (Signed)
UR Chart review completed.  

## 2011-05-08 MED ORDER — MEDROXYPROGESTERONE ACETATE 150 MG/ML IM SUSP: 150.0000 mg | INTRAMUSCULAR | Status: DC

## 2011-05-08 NOTE — Plan of Care (Signed)
Problem: Discharge Progression Outcomes Goal: Barriers To Progression Addressed/Resolved Baby staying as a baby pt b/c of poor bottle feedings.

## 2011-05-08 NOTE — Discharge Summary (Signed)
Obstetric Discharge Summary Reason for Admission: onset of labor Prenatal Procedures: none Intrapartum Procedures: spontaneous vaginal delivery Postpartum Procedures: none Complications-Operative and Postpartum: none Hemoglobin  Date Value Range Status  05/07/2011 11.1* 12.0-15.0 (g/dL) Final     HCT  Date Value Range Status  05/07/2011 31.7* 36.0-46.0 (%) Final    Discharge Diagnoses: Term Pregnancy-delivered  Discharge Information: Date: 05/08/2011 Activity: pelvic rest Diet: routine Medications: Tylenol #3 Condition: stable Instructions: refer to practice specific booklet Discharge to: home Follow-up Information    Follow up with Lasundra Hascall A, MD. Call in 6 weeks.   Contact information:   [address] [place] [address] [phone]          Newborn Data: Live born female  Birth Weight: 5 lb 14 oz (2665 g) APGAR: 9, 9  Home with mother.  Tremond Shimabukuro A 05/08/2011, 6:08 AM

## 2013-09-09 ENCOUNTER — Emergency Department (HOSPITAL_COMMUNITY): Payer: Medicaid Other

## 2013-09-09 ENCOUNTER — Encounter (HOSPITAL_COMMUNITY): Payer: Self-pay | Admitting: Emergency Medicine

## 2013-09-09 ENCOUNTER — Emergency Department (HOSPITAL_COMMUNITY)
Admission: EM | Admit: 2013-09-09 | Discharge: 2013-09-09 | Disposition: A | Discharge status: Home / Self Care | Payer: Medicaid Other | Attending: Emergency Medicine | Admitting: Emergency Medicine

## 2013-09-09 DIAGNOSIS — J329 Chronic sinusitis, unspecified: Secondary | ICD-10-CM | POA: Insufficient documentation

## 2013-09-09 DIAGNOSIS — Z79899 Other long term (current) drug therapy: Secondary | ICD-10-CM | POA: Insufficient documentation

## 2013-09-09 DIAGNOSIS — R110 Nausea: Secondary | ICD-10-CM | POA: Insufficient documentation

## 2013-09-09 MED ORDER — AMOXICILLIN 500 MG PO CAPS
500.0000 mg | ORAL_CAPSULE | Freq: Once | ORAL | Status: AC
  Administered 2013-09-09: 500 mg via ORAL
  Filled 2013-09-09: qty 1

## 2013-09-09 MED ORDER — IBUPROFEN 800 MG PO TABS
800.0000 mg | ORAL_TABLET | Freq: Once | ORAL | Status: AC
  Administered 2013-09-09: 800 mg via ORAL
  Filled 2013-09-09: qty 1

## 2013-09-09 MED ORDER — AMOXICILLIN 500 MG PO CAPS: 500.0000 mg | ORAL_CAPSULE | Freq: Three times a day (TID) | ORAL | Status: DC

## 2013-09-09 NOTE — Discharge Instructions (Signed)
Take the prescribed medication as directed. May continue tylenol/motrin as needed for headaches. Return to the ED for new or worsening symptoms.

## 2013-09-09 NOTE — ED Notes (Signed)
Pt states that she has been having nasal congestion, headache, cough x 3 days.  Pt is wearing a blanket on her head and is wearing sunglasses.

## 2013-09-09 NOTE — ED Provider Notes (Signed)
CSN: [id number]     Arrival date & time 09/09/13  0946 History   First MD Initiated Contact with Patient 09/09/13 [phone]     Chief Complaint  Patient presents with  . Headache  . Nasal Congestion     (Consider location/radiation/quality/duration/timing/severity/associated sxs/prior Treatment) Patient is a 33 y.o. female presenting with headaches. The history is provided by the patient and medical records.  Headache Associated symptoms: congestion and sinus pressure    This is a 33 year old female with no significant past medical history presenting to the ED for a nonproductive cough, nasal congestion, sinus pressure, and headache for the past 3 days. Patient endorses subjective fever and chills.  Denies sick contacts at home. She's been taking Mucinex and NyQuil without significant improvement of her symptoms. She states coughs really hard she is nauseated and feels that she is going to vomit, but has not vomited as of yet.  Denies possibility of pregnancy, LMP was last week.  Took 400mg  motrin 4 hours PTA with minimal relief.  VS stable on arrival.  Past Medical History  Diagnosis Date  . No pertinent past medical history    Past Surgical History  Procedure Laterality Date  . Dilation and curettage of uterus     Family History  Problem Relation Age of Onset  . Anesthesia problems Neg Hx   . Hypotension Neg Hx   . Malignant hyperthermia Neg Hx   . Pseudochol deficiency Neg Hx    History  Substance Use Topics  . Smoking status: Never Smoker   . Smokeless tobacco: Not on file  . Alcohol Use: No   OB History   Grav Para Term Preterm Abortions TAB SAB Ect Mult Living   4 3 3  0 1 1 0 0 0 3     Review of Systems  HENT: Positive for congestion and sinus pressure.   Neurological: Positive for headaches.  All other systems reviewed and are negative.      Allergies  Review of patient's allergies indicates no known allergies.  Home Medications   Current Outpatient Rx  Name   Route  Sig  Dispense  Refill  . dextromethorphan-guaiFENesin (MUCINEX DM) 30-600 MG per 12 hr tablet   Oral   Take 1 tablet by mouth 2 (two) times daily.         Marland Kitchen DM-Doxylamine-Acetaminophen (NYQUIL COLD & FLU PO)   Oral   Take 30 mLs by mouth at bedtime as needed (cold/flu symptoms).          BP 115/70  Pulse 72  Temp(Src) 98.6 F (37 C) (Oral)  Resp 16  SpO2 100%  LMP 09/02/2013  Physical Exam  Nursing note and vitals reviewed. Constitutional: She is oriented to person, place, and time. She appears well-developed and well-nourished. No distress.  HENT:  Head: Normocephalic and atraumatic.  Right Ear: Tympanic membrane and ear canal normal.  Left Ear: Tympanic membrane and ear canal normal.  Nose: Mucosal edema present. Right sinus exhibits maxillary sinus tenderness and frontal sinus tenderness. Left sinus exhibits maxillary sinus tenderness and frontal sinus tenderness.  Mouth/Throat: Uvula is midline, oropharynx is clear and moist and mucous membranes are normal. No oropharyngeal exudate, posterior oropharyngeal edema or posterior oropharyngeal erythema.  Eyes: Conjunctivae and EOM are normal. Pupils are equal, round, and reactive to light.  Neck: Normal range of motion and full passive range of motion without pain. Neck supple. No rigidity.  No meningeal signs  Cardiovascular: Normal rate, regular rhythm and normal heart sounds.  Pulmonary/Chest: Effort normal and breath sounds normal. No respiratory distress. She has no wheezes.  Abdominal: Soft. Bowel sounds are normal. There is no tenderness. There is no guarding.  Musculoskeletal: Normal range of motion.  Neurological: She is alert and oriented to person, place, and time. She has normal strength. She displays no tremor. No cranial nerve deficit or sensory deficit. She displays no seizure activity.  No focal neuro deficits appreciated  Skin: Skin is warm and dry. She is not diaphoretic.  Psychiatric: She has a  normal mood and affect.    ED Course  Procedures (including critical care time) Labs Review Labs Reviewed - No data to display Imaging Review Dg Chest 2 View  09/09/2013   CLINICAL DATA:  Cough, congestion  EXAM: CHEST  2 VIEW  COMPARISON:  None.  FINDINGS: Cardiomediastinal silhouette is unremarkable. No acute infiltrate or pleural effusion. No pulmonary edema. Mild hyperinflation.  IMPRESSION: No active cardiopulmonary disease.   Electronically Signed   By: Lahoma Crocker M.D.   On: 09/09/2013 10:29     EKG Interpretation None      MDM   Final diagnoses:  Sinusitis   Pt afebrile and overall non-toxic appearing.  Headache without meningeal signs of focal neuro deficits on exam.  Headache improved with motrin.  CXR negative.  Pt started on amoxicillin for suspected sinusitis, first dose given in the ED.  Discussed plan with pt, she agreed.  Return precautions advised.  Larene Pickett, PA-C 09/09/13 1122

## 2013-09-09 NOTE — ED Notes (Addendum)
Pt reports nonproductive cough for three days. Pt reports "cough so much I've been vomiting." Pt denies coughing up blood.

## 2013-09-09 NOTE — ED Provider Notes (Signed)
Medical screening examination/treatment/procedure(s) were performed by non-physician practitioner and as supervising physician I was immediately available for consultation/collaboration.   EKG Interpretation None        Ezequiel Essex, MD 09/09/13 1537

## 2013-09-29 ENCOUNTER — Emergency Department (HOSPITAL_COMMUNITY)
Admission: EM | Admit: 2013-09-29 | Discharge: 2013-09-30 | Disposition: A | Discharge status: Home / Self Care | Payer: Medicaid Other | Attending: Emergency Medicine | Admitting: Emergency Medicine

## 2013-09-29 ENCOUNTER — Encounter (HOSPITAL_COMMUNITY): Payer: Self-pay | Admitting: Emergency Medicine

## 2013-09-29 DIAGNOSIS — Z3202 Encounter for pregnancy test, result negative: Secondary | ICD-10-CM | POA: Insufficient documentation

## 2013-09-29 DIAGNOSIS — Z79899 Other long term (current) drug therapy: Secondary | ICD-10-CM | POA: Insufficient documentation

## 2013-09-29 DIAGNOSIS — R112 Nausea with vomiting, unspecified: Secondary | ICD-10-CM | POA: Insufficient documentation

## 2013-09-29 DIAGNOSIS — A5901 Trichomonal vulvovaginitis: Secondary | ICD-10-CM | POA: Insufficient documentation

## 2013-09-29 DIAGNOSIS — R109 Unspecified abdominal pain: Secondary | ICD-10-CM | POA: Insufficient documentation

## 2013-09-29 DIAGNOSIS — Z792 Long term (current) use of antibiotics: Secondary | ICD-10-CM | POA: Insufficient documentation

## 2013-09-29 DIAGNOSIS — A599 Trichomoniasis, unspecified: Secondary | ICD-10-CM

## 2013-09-29 LAB — COMPREHENSIVE METABOLIC PANEL
ALBUMIN: 3.9 g/dL (ref 3.5–5.2)
ALK PHOS: 49 U/L (ref 39–117)
ALT: 13 U/L (ref 0–35)
AST: 20 U/L (ref 0–37)
BUN: 7 mg/dL (ref 6–23)
CO2: 15 mEq/L — ABNORMAL LOW (ref 19–32)
CREATININE: 0.54 mg/dL (ref 0.50–1.10)
Calcium: 8.5 mg/dL (ref 8.4–10.5)
Chloride: 108 mEq/L (ref 96–112)
GFR calc Af Amer: >90 mL/min (ref >90)
GFR calc non Af Amer: >90 mL/min (ref >90)
Glucose, Bld: 99 mg/dL (ref 70–99)
POTASSIUM: 3.6 meq/L — AB (ref 3.7–5.3)
Sodium: 144 mEq/L (ref 137–147)
TOTAL PROTEIN: 7 g/dL (ref 6.0–8.3)
Total Bilirubin: 0.3 mg/dL (ref 0.3–1.2)

## 2013-09-29 LAB — LIPASE, BLOOD: LIPASE: 13 U/L (ref 11–59)

## 2013-09-29 LAB — I-STAT TROPONIN, ED: TROPONIN I, POC: 0 ng/mL (ref 0.00–0.08)

## 2013-09-29 LAB — URINALYSIS, ROUTINE W REFLEX MICROSCOPIC
Bilirubin Urine: NEGATIVE
Glucose, UA: NEGATIVE mg/dL
Ketones, ur: 40 mg/dL — AB
Leukocytes, UA: NEGATIVE
Nitrite: NEGATIVE
PROTEIN: 30 mg/dL — AB
Specific Gravity, Urine: 1.024 (ref 1.005–1.030)
UROBILINOGEN UA: 0.2 mg/dL (ref 0.0–1.0)
pH: 5.5 (ref 5.0–8.0)

## 2013-09-29 LAB — POC URINE PREG, ED: Preg Test, Ur: NEGATIVE

## 2013-09-29 LAB — URINE MICROSCOPIC-ADD ON

## 2013-09-29 MED ORDER — SODIUM CHLORIDE 0.9 % IV SOLN
1000.0000 mL | Freq: Once | INTRAVENOUS | Status: AC
  Administered 2013-09-29: 1000 mL via INTRAVENOUS

## 2013-09-29 MED ORDER — METOCLOPRAMIDE HCL 5 MG/ML IJ SOLN: 10.0000 mg | Freq: Once | INTRAMUSCULAR | Status: DC

## 2013-09-29 MED ORDER — HYDROMORPHONE HCL PF 1 MG/ML IJ SOLN
0.5000 mg | Freq: Once | INTRAMUSCULAR | Status: AC
  Administered 2013-09-30: 0.5 mg via INTRAVENOUS
  Filled 2013-09-29: qty 1

## 2013-09-29 MED ORDER — PROMETHAZINE HCL 25 MG/ML IJ SOLN
12.5000 mg | Freq: Once | INTRAMUSCULAR | Status: AC
  Administered 2013-09-29: 12.5 mg via INTRAVENOUS
  Filled 2013-09-29: qty 1

## 2013-09-29 MED ORDER — SODIUM CHLORIDE 0.9 % IV BOLUS (SEPSIS)
1000.0000 mL | Freq: Once | INTRAVENOUS | Status: AC
  Administered 2013-09-30: 1000 mL via INTRAVENOUS

## 2013-09-29 NOTE — ED Provider Notes (Signed)
CSN: [id number]     Arrival date & time 09/29/13  2241 History   First MD Initiated Contact with Patient 09/29/13 2246     Chief Complaint  Patient presents with  . Nausea  . Emesis  . Diarrhea     (Consider location/radiation/quality/duration/timing/severity/associated sxs/prior Treatment) HPI Veronica Green is a 33 y.o. female who presents to emergency department with complaint of abdominal pain, nausea, vomiting. Patient states her symptoms began this afternoon. States persistent nausea vomiting that has worsened throughout the night. Denies any diarrhea. Denies any fever chills. States pain is diffuse, all over the abdomen. She did not take any medications for this. Unable to obtain any more information, pt is rolling around cursing and crying.   Past Medical History  Diagnosis Date  . No pertinent past medical history    Past Surgical History  Procedure Laterality Date  . Dilation and curettage of uterus     Family History  Problem Relation Age of Onset  . Anesthesia problems Neg Hx   . Hypotension Neg Hx   . Malignant hyperthermia Neg Hx   . Pseudochol deficiency Neg Hx    History  Substance Use Topics  . Smoking status: Never Smoker   . Smokeless tobacco: Not on file  . Alcohol Use: No   OB History   Grav Para Term Preterm Abortions TAB SAB Ect Mult Living   4 3 3  0 1 1 0 0 0 3     Review of Systems  Constitutional: Negative for fever and chills.  Respiratory: Negative for cough, chest tightness and shortness of breath.   Cardiovascular: Negative for chest pain, palpitations and leg swelling.  Gastrointestinal: Positive for vomiting and abdominal pain. Negative for nausea and diarrhea.  Genitourinary: Negative for dysuria, flank pain, vaginal bleeding, vaginal discharge, vaginal pain and pelvic pain.  Musculoskeletal: Negative for arthralgias, myalgias, neck pain and neck stiffness.  Skin: Negative for rash.  Neurological: Negative for dizziness, weakness and  headaches.  All other systems reviewed and are negative.     Allergies  Review of patient's allergies indicates no known allergies.  Home Medications   Prior to Admission medications   Medication Sig Start Date End Date Taking? Authorizing Provider  amoxicillin (AMOXIL) 500 MG capsule Take 1 capsule (500 mg total) by mouth 3 (three) times daily. 09/09/13   Larene Pickett, PA-C  dextromethorphan-guaiFENesin [facility] DM) 30-600 MG per 12 hr tablet Take 1 tablet by mouth 2 (two) times daily.    Historical Provider, MD  DM-Doxylamine-Acetaminophen (NYQUIL COLD & FLU PO) Take 30 mLs by mouth at bedtime as needed (cold/flu symptoms).    Historical Provider, MD   BP 109/65  Pulse 84  Temp(Src) 97.8 F (36.6 C) (Oral)  Resp 20  SpO2 100%  LMP 08/28/2013 Physical Exam  Nursing note and vitals reviewed. Constitutional: She appears well-developed and well-nourished. No distress.  HENT:  Head: Normocephalic.  Eyes: Conjunctivae are normal.  Neck: Neck supple.  Cardiovascular: Normal rate, regular rhythm and normal heart sounds.   Pulmonary/Chest: Effort normal and breath sounds normal. No respiratory distress. She has no wheezes. She has no rales.  Abdominal: Soft. Bowel sounds are normal. She exhibits no distension. There is tenderness. There is no rebound.  Abdomen non tender.   Genitourinary:  Normal external genitalia. White thin vaginal discharge. No CMT. No adnexal tenderness. No uterine tenderness  Musculoskeletal: She exhibits no edema.  Neurological: She is alert.  Skin: Skin is warm and dry.  Psychiatric: She  has a normal mood and affect. Her behavior is normal.    ED Course  Procedures (including critical care time) Labs Review Labs Reviewed  CBC WITH DIFFERENTIAL - Abnormal; Notable for the following:    WBC 12.3 (*)    HCT 35.0 (*)    MCHC 36.6 (*)    Neutrophils Relative % 88 (*)    Lymphocytes Relative 11 (*)    Monocytes Relative 1 (*)    Neutro Abs 10.8 (*)     All other components within normal limits  COMPREHENSIVE METABOLIC PANEL - Abnormal; Notable for the following:    Potassium 3.6 (*)    CO2 15 (*)    All other components within normal limits  URINALYSIS, ROUTINE W REFLEX MICROSCOPIC - Abnormal; Notable for the following:    APPearance CLOUDY (*)    Hgb urine dipstick MODERATE (*)    Ketones, ur 40 (*)    Protein, ur 30 (*)    All other components within normal limits  URINE MICROSCOPIC-ADD ON - Abnormal; Notable for the following:    Squamous Epithelial / LPF FEW (*)    All other components within normal limits  URINE CULTURE  GC/CHLAMYDIA PROBE AMP  WET PREP, GENITAL  LIPASE, BLOOD  I-STAT TROPOININ, ED  POC URINE PREG, ED    Imaging Review No results found.   EKG Interpretation None      MDM   Final diagnoses:  None    1:03 AM Pt feeling slightly better after dilaudid 0.5mg  IV, phenergan 12.5 mg, reglan 10mg  IV. i was able to perform a pelvic exam which was unremarkable. Pt continues to have RLQ tenderness. Will get CT abd/pelvis for further evaluation to r/o appendicitis. PT also with anion gap of 21. Presumably from vomiting. Will need to be rechecked after hydration.  1:45 AM Wet prep positive for many clue cells, many WBCs, trichomonas. Treated in ED with Rocephin 250mg  IM, flagyl 2g. Will need doxycycline upon discharge.   Pt signed out with PA humes, pending CT abd/pelvis. She is feeling much better. Tolerating PO contrast.         Renold Genta, PA-C 09/30/13 1510

## 2013-09-29 NOTE — ED Notes (Signed)
Patient states she had a sudden onset of abdominal pain and vomit today. She states she has not had any food to eat today and has only had one bowel movement. Patient had no emesis in bag upon arrival just saliva. She states she has not had anything different to eat or drink in the last 24 hours and no one around her is sick.

## 2013-09-29 NOTE — ED Notes (Signed)
Bed: WA07 Expected date:  Expected time:  Means of arrival:  Comments: EMS 32F abd pain n/v/d

## 2013-09-30 ENCOUNTER — Emergency Department (HOSPITAL_COMMUNITY): Payer: Medicaid Other

## 2013-09-30 ENCOUNTER — Encounter (HOSPITAL_COMMUNITY): Payer: Self-pay | Admitting: Radiology

## 2013-09-30 LAB — BASIC METABOLIC PANEL
BUN: 7 mg/dL (ref 6–23)
CO2: 19 meq/L (ref 19–32)
Calcium: 7.5 mg/dL — ABNORMAL LOW (ref 8.4–10.5)
Chloride: 109 mEq/L (ref 96–112)
Creatinine, Ser: 0.56 mg/dL (ref 0.50–1.10)
GFR calc Af Amer: >90 mL/min (ref >90)
Glucose, Bld: 92 mg/dL (ref 70–99)
Potassium: 4.2 mEq/L (ref 3.7–5.3)
Sodium: 143 mEq/L (ref 137–147)

## 2013-09-30 LAB — CBC WITH DIFFERENTIAL/PLATELET
BASOS ABS: 0 10*3/uL (ref 0.0–0.1)
Basophils Relative: 0 % (ref 0–1)
EOS ABS: 0 10*3/uL (ref 0.0–0.7)
Eosinophils Relative: 0 % (ref 0–5)
HEMATOCRIT: 35 % — AB (ref 36.0–46.0)
Hemoglobin: 12.8 g/dL (ref 12.0–15.0)
LYMPHS PCT: 11 % — AB (ref 12–46)
Lymphs Abs: 1.4 10*3/uL (ref 0.7–4.0)
MCH: 30.8 pg (ref 26.0–34.0)
MCHC: 36.6 g/dL — AB (ref 30.0–36.0)
MCV: 84.1 fL (ref 78.0–100.0)
MONOS PCT: 1 % — AB (ref 3–12)
Monocytes Absolute: 0.1 10*3/uL (ref 0.1–1.0)
NEUTROS ABS: 10.8 10*3/uL — AB (ref 1.7–7.7)
Neutrophils Relative %: 88 % — ABNORMAL HIGH (ref 43–77)
PLATELETS: 260 10*3/uL (ref 150–400)
RBC: 4.16 MIL/uL (ref 3.87–5.11)
RDW: 12.5 % (ref 11.5–15.5)
WBC: 12.3 10*3/uL — AB (ref 4.0–10.5)

## 2013-09-30 LAB — WET PREP, GENITAL: YEAST WET PREP: NONE SEEN

## 2013-09-30 LAB — GC/CHLAMYDIA PROBE AMP
CT Probe RNA: NEGATIVE
GC Probe RNA: NEGATIVE

## 2013-09-30 MED ORDER — METRONIDAZOLE 500 MG PO TABS
2000.0000 mg | ORAL_TABLET | Freq: Once | ORAL | Status: AC
  Administered 2013-09-30: 2000 mg via ORAL
  Filled 2013-09-30: qty 4

## 2013-09-30 MED ORDER — IOHEXOL 300 MG/ML  SOLN
50.0000 mL | Freq: Once | INTRAMUSCULAR | Status: AC | PRN
  Administered 2013-09-30: 50 mL via ORAL

## 2013-09-30 MED ORDER — METRONIDAZOLE 500 MG PO TABS: 500.0000 mg | ORAL_TABLET | Freq: Two times a day (BID) | ORAL | Status: DC

## 2013-09-30 MED ORDER — OXYCODONE-ACETAMINOPHEN 5-325 MG PO TABS: 2.0000 | ORAL_TABLET | ORAL | Status: DC | PRN

## 2013-09-30 MED ORDER — PROMETHAZINE HCL 25 MG PO TABS: 25.0000 mg | ORAL_TABLET | Freq: Four times a day (QID) | ORAL | Status: DC | PRN

## 2013-09-30 MED ORDER — ONDANSETRON HCL 4 MG PO TABS: 4.0000 mg | ORAL_TABLET | Freq: Four times a day (QID) | ORAL | Status: DC

## 2013-09-30 MED ORDER — SODIUM CHLORIDE 0.9 % IV BOLUS (SEPSIS)
1000.0000 mL | Freq: Once | INTRAVENOUS | Status: AC
  Administered 2013-09-30: 1000 mL via INTRAVENOUS

## 2013-09-30 MED ORDER — CEFTRIAXONE SODIUM 250 MG IJ SOLR
250.0000 mg | Freq: Once | INTRAMUSCULAR | Status: AC
  Administered 2013-09-30: 250 mg via INTRAMUSCULAR
  Filled 2013-09-30: qty 250

## 2013-09-30 MED ORDER — OXYCODONE-ACETAMINOPHEN 5-325 MG PO TABS: 2.0000 | ORAL_TABLET | Freq: Once | ORAL | Status: DC

## 2013-09-30 MED ORDER — ONDANSETRON HCL 4 MG/2ML IJ SOLN: 4.0000 mg | INTRAMUSCULAR | Status: DC

## 2013-09-30 MED ORDER — IOHEXOL 300 MG/ML  SOLN
100.0000 mL | Freq: Once | INTRAMUSCULAR | Status: AC | PRN
  Administered 2013-09-30: 100 mL via INTRAVENOUS

## 2013-09-30 MED ORDER — DOXYCYCLINE HYCLATE 100 MG PO CAPS: 100.0000 mg | ORAL_CAPSULE | Freq: Two times a day (BID) | ORAL | Status: DC

## 2013-09-30 MED ORDER — OXYCODONE-ACETAMINOPHEN 5-325 MG PO TABS: 1.0000 | ORAL_TABLET | Freq: Four times a day (QID) | ORAL | Status: DC | PRN

## 2013-09-30 MED ORDER — METOCLOPRAMIDE HCL 5 MG/ML IJ SOLN
10.0000 mg | Freq: Once | INTRAMUSCULAR | Status: AC
  Administered 2013-09-30: 10 mg via INTRAVENOUS
  Filled 2013-09-30: qty 2

## 2013-09-30 MED ORDER — FENTANYL CITRATE 0.05 MG/ML IJ SOLN
50.0000 ug | Freq: Once | INTRAMUSCULAR | Status: AC
  Administered 2013-09-30: 50 ug via INTRAVENOUS
  Filled 2013-09-30: qty 2

## 2013-09-30 NOTE — ED Notes (Signed)
Pelvic cart at bedside. 

## 2013-09-30 NOTE — ED Notes (Signed)
Patient transported to CT 

## 2013-09-30 NOTE — Discharge Instructions (Signed)
Recommended that you take doxycycline and Flagyl as prescribed to cover for pelvic inflammatory disease. Do not drink alcohol while taking Flagyl as it will make you violently ill vomiting. Recommend you takes Zofran as needed for nausea/vomiting. Follow up with your primary care provider to discuss your visit today. Also recommend follow up with gastroenterology to discuss your CT findings. Return if symptoms worsen.  Abdominal Pain, Women Abdominal (stomach, pelvic, or belly) pain can be caused by many things. It is important to tell your doctor:  The location of the pain.  Does it come and go or is it present all the time?  Are there things that start the pain (eating certain foods, exercise)?  Are there other symptoms associated with the pain (fever, nausea, vomiting, diarrhea)? All of this is helpful to know when trying to find the cause of the pain. CAUSES   Stomach: virus or bacteria infection, or ulcer.  Intestine: appendicitis (inflamed appendix), regional ileitis (Crohn's disease), ulcerative colitis (inflamed colon), irritable bowel syndrome, diverticulitis (inflamed diverticulum of the colon), or cancer of the stomach or intestine.  Gallbladder disease or stones in the gallbladder.  Kidney disease, kidney stones, or infection.  Pancreas infection or cancer.  Fibromyalgia (pain disorder).  Diseases of the female organs:  Uterus: fibroid (non-cancerous) tumors or infection.  Fallopian tubes: infection or tubal pregnancy.  Ovary: cysts or tumors.  Pelvic adhesions (scar tissue).  Endometriosis (uterus lining tissue growing in the pelvis and on the pelvic organs).  Pelvic congestion syndrome (female organs filling up with blood just before the menstrual period).  Pain with the menstrual period.  Pain with ovulation (producing an egg).  Pain with an IUD (intrauterine device, birth control) in the uterus.  Cancer of the female organs.  Functional pain (pain not  caused by a disease, may improve without treatment).  Psychological pain.  Depression. DIAGNOSIS  Your doctor will decide the seriousness of your pain by doing an examination.  Blood tests.  X-rays.  Ultrasound.  CT scan (computed tomography, special type of X-ray).  MRI (magnetic resonance imaging).  Cultures, for infection.  Barium enema (dye inserted in the large intestine, to better view it with X-rays).  Colonoscopy (looking in intestine with a lighted tube).  Laparoscopy (minor surgery, looking in abdomen with a lighted tube).  Major abdominal exploratory surgery (looking in abdomen with a large incision). TREATMENT  The treatment will depend on the cause of the pain.   Many cases can be observed and treated at home.  Over-the-counter medicines recommended by your caregiver.  Prescription medicine.  Antibiotics, for infection.  Birth control pills, for painful periods or for ovulation pain.  Hormone treatment, for endometriosis.  Nerve blocking injections.  Physical therapy.  Antidepressants.  Counseling with a psychologist or psychiatrist.  Minor or major surgery. HOME CARE INSTRUCTIONS   Do not take laxatives, unless directed by your caregiver.  Take over-the-counter pain medicine only if ordered by your caregiver. Do not take aspirin because it can cause an upset stomach or bleeding.  Try a clear liquid diet (broth or water) as ordered by your caregiver. Slowly move to a bland diet, as tolerated, if the pain is related to the stomach or intestine.  Have a thermometer and take your temperature several times a day, and record it.  Bed rest and sleep, if it helps the pain.  Avoid sexual intercourse, if it causes pain.  Avoid stressful situations.  Keep your follow-up appointments and tests, as your caregiver orders.  If the pain does not go away with medicine or surgery, you may try:  Acupuncture.  Relaxation exercises (yoga,  meditation).  Group therapy.  Counseling. SEEK MEDICAL CARE IF:   You notice certain foods cause stomach pain.  Your home care treatment is not helping your pain.  You need stronger pain medicine.  You want your IUD removed.  You feel faint or lightheaded.  You develop nausea and vomiting.  You develop a rash.  You are having side effects or an allergy to your medicine. SEEK IMMEDIATE MEDICAL CARE IF:   Your pain does not go away or gets worse.  You have a fever.  Your pain is felt only in portions of the abdomen. The right side could possibly be appendicitis. The left lower portion of the abdomen could be colitis or diverticulitis.  You are passing blood in your stools (bright red or black tarry stools, with or without vomiting).  You have blood in your urine.  You develop chills, with or without a fever.  You pass out. MAKE SURE YOU:   Understand these instructions.  Will watch your condition.  Will get help right away if you are not doing well or get worse. Document Released: 03/22/2007 Document Revised: 08/17/2011 Document Reviewed: 04/11/2009 [facility] Patient Information 2014 [place], [place].  Emergency Department Resource Guide 1) Find a Doctor and Pay Out of Pocket Although you won't have to find out who is covered by your insurance plan, it is a good idea to ask around and get recommendations. You will then need to call the office and see if the doctor you have chosen will accept you as a new patient and what types of options they offer for patients who are self-pay. Some doctors offer discounts or will set up payment plans for their patients who do not have insurance, but you will need to ask so you aren't surprised when you get to your appointment.  2) Contact Your Local Health Department Not all health departments have doctors that can see patients for sick visits, but many do, so it is worth a call to see if yours does. If you don't know where your  local health department is, you can check in your phone book. The CDC also has a tool to help you locate your state's health department, and many state websites also have listings of all of their local health departments.  3) Find a [facility] If your illness is not likely to be very severe or complicated, you may want to try a walk in clinic. These are popping up all over the country in pharmacies, drugstores, and shopping centers. They're usually staffed by nurse practitioners or physician assistants that have been trained to treat common illnesses and complaints. They're usually fairly quick and inexpensive. However, if you have serious medical issues or chronic medical problems, these are probably not your best option.  No Primary Care Doctor: - Call Health Connect at  [phone] - they can help you locate a primary care doctor that  accepts your insurance, provides certain services, etc. - Physician Referral Service- [phone]  Chronic Pain Problems: Organization         Address  Phone   Notes  [facility]  [phone] Patients need to be referred by their primary care doctor.   Medication Assistance: Organization         Address  Phone   Notes  [facility] Medication Assistance Program [phone]  [place], [place] [address] [phone] --Must be a resident of [facility] -- Must have NO insurance coverage whatsoever (no Medicaid/ Medicare, etc.) -- The pt. MUST have a primary care doctor that directs their care regularly and follows them in the community   MedAssist  [phone]   [employer]  [phone]    Agencies that provide inexpensive medical care: Organization         Address  Phone   Notes  [place]  [phone]   [facility]    [phone]   [facility] [place], [place] [address] [phone]   [place]  [address], [place] [phone]   Planned Parenthood    [phone]   [facility]    [phone]   [place] and [place] [address], [place] Phone:  [phone], Fax:  [phone] Hours of Operation:  9 am - 6 pm, M-F.  Also accepts Medicaid/Medicare and self-pay.  [facility] for [place] [address], [address], [place] Phone: [phone], Fax: [phone]. Hours of Operation:  8:30 am - 5:30 pm, M-F.  Also accepts Medicaid and self-pay.  [facility] [address] [place] Phone: [phone]   [place], [place], [place] [phone], Ext. [phone] Mondays & Thursdays: 7-9 AM.  First 15 patients are seen on a first come, first serve basis.    [place] Providers:  Organization         Address  Phone   Notes  [facility] [address], [place] [phone] Also accepts self-pay patients.  [facility] [address], [place]  [phone]   [place], [address], [place] [phone]   [facility] [address] [place] [phone]   Lucianne Lei [address], [place]   [phone] Only accepts [place] Access [place] patients after they have their name applied to their card.   Self-Pay (no insurance) in [facility]:  Organization         Address  Phone   Notes  Sickle Cell Patients, [facility] Internal Medicine [place] [phone]   [facility] Urgent Care [place] [phone]   [facility] [place]  [place], [place], [place] [phone]   [facility]/Dr. Osei-Bonsu  [address], [place] or [address], [place] [phone] Phone number for both [place] and [place] locations is the same.  Urgent Medical and [facility] [address] [place] [phone]   [facility] [address], [place] or [address] [phone]   [facility] [address], [place] [phone], phone; [phone], fax Sees patients 1st and 3rd Saturday of every month.  Must not qualify for public or private insurance (i.e. Medicaid, Medicare, [place] Health Choice, Veterans' Benefits)  Household income should be no more than 200% of the poverty level The clinic cannot treat you if you are pregnant or think you are pregnant  Sexually transmitted diseases are not treated at the clinic.    Dental Care: Organization  Address  Phone  Notes  [facility] [place], [place] [phone] Accepts children up to age 28 who are enrolled in [place] or [place]; pregnant women with a Medicaid card; and children who have applied for Medicaid or [place] Health Choice, but were declined, whose parents can pay a reduced fee at time of service.  [facility]  [address], [place] [phone] Accepts children up to age 51 who are enrolled in [place] or [place]; pregnant women with a Medicaid card; and children who have applied for Medicaid or  Health Choice, but were declined, whose parents can pay a reduced fee at time of service.  [place] Adult Dental Access PROGRAM  [place] [phone] Patients are seen by appointment only. Walk-ins are not accepted. [place] will see patients 84 years of age and older. Monday - Tuesday (8am-5pm) Most Wednesdays (8:30-5pm) $30 per visit, cash only  [facility] Adult Dental Access PROGRAM  [address], [place] [phone] Patients are seen by appointment only. Walk-ins are not  accepted. [place] will see patients 66 years of age and older. One Wednesday Evening (Monthly: Volunteer Based).  $30 per visit, cash only  [place]  [phone] for adults; Children under age 75, call Graduate Pediatric Dentistry at [phone]. Children aged 61-14, please call [phone] to request a pediatric application.  Dental services are provided in all areas of dental care including fillings, crowns and bridges, complete and partial dentures, implants, gum treatment, root canals, and extractions. Preventive care is also provided. Treatment is provided to both adults and children. Patients are selected via a lottery and there is often a waiting list.   [facility] [address] [place]  [phone] [email or web address]   [facility] [address] [place], [place] [phone], Ext. [phone] Second and Fourth Thursday of each month, opens at 6:30 AM; Clinic ends at 9 AM.  Patients are seen on a first-come first-served basis, and a limited number are seen during each clinic.   [facility]  [address] Hillard Danker [place], [place] [phone]   Eligibility Requirements You must have lived in [place], [place], or [place] counties for at least the last three months.   You cannot be eligible for state or federal sponsored [employer], including [employer], [place], or [employer].   You generally cannot be eligible for healthcare insurance through your employer.    How to apply: Eligibility screenings are held every Tuesday and Wednesday afternoon from 1:00 pm until 4:00 pm. You do not need an appointment for the interview!  [facility] [address], [place], [place]   [place]  [place] Department  [place]  [phone]    Behavioral Health Resources in the  Community: Intensive Outpatient Programs Organization         Address  Phone  Notes  [place]. [address], [place], [place] [phone]   [facility] Outpatient [address] [place], [place]   ADS: Alcohol & Drug Svcs [address] [place], [place]   [place] [address],  [place], [place] or [phone]   Substance Abuse Resources  Organization         Address  Phone  Notes  Alcohol and Drug Services  [place]  [phone]   [facility]  [phone]   [place]a Pester  [phone]   Residential & Outpatient Substance Abuse Program  [phone]   Psychological Services Organization         Address  Phone  Notes  [facility]  [place]  [phone]   [place] [address], [place] or [phone]    Mobile Crisis Teams Organization         Address  Phone  Notes  [facility], Mobile Crisis Care Unit  [phone]   [facility]  [address] [place], [place]   [place] Levels [address] [place] [phone]    Self-Help/Support Groups Organization         Address  Phone             Notes  [place]. of [place] - variety of support groups  [place] Call for more information  Narcotics Anonymous (NA), [facility]aring [facility] [address], [employer] [place]  2 meetings at this location   Special educational needs teacher         Address  Phone  Notes  [facility] [place],    [place]  [phone]   [facility]  [address] [place] [address]86, [place], [place]   [place], [place] [phone] Admissions: 8am-3pm M-F  Incentives Substance [place] [phone],    [place], [place] [phone]   [facility] [address] [place], [place], [place]   [facility] [address]  [place], [place]   [facility] Programs - Intensive Outpatient [address]een [address], [place], [place]   [facility] ([place].,  [place], [place] [phone] or [phone]   [facility] Services (RTS) [address] [place], [place] Accepts Medicaid  [facility] [place] [address],  [place] [phone] Substance Abuse/Addiction Treatment   [facility] Organization         Address  Phone  Notes  [facility]  [phone]   Domenic Schwab, PhD [address] Arlis Porta [place], [place]   [phone] or [phone]   [place], [place] [phone]   [facility] [address] [place], [place] [phone] Insurance/Medicaid/sponsorship through [place] [facility] and Families [address]                                    [place], [place] [phone] [place] [address], [place] [phone]    Dr. Adele Schilder  [phone]   [facility] Dept. 1) [address] [place] 2) [place] 3)  [place] [address], [place] [phone]  [phone]   [place] [phone] or [phone] (After Hours)

## 2013-09-30 NOTE — ED Notes (Signed)
Pt said nausea has returned. Will notify nurse.

## 2013-09-30 NOTE — ED Provider Notes (Signed)
0200 - Patient care assumed from [facility], PA-C at shift change. Patient with RLQ abdominal pain and N/V. Wet prep with many trichomonas. CT pending to evaluate further for cause of pain. If negative for emergent process, plan discussed with [place], PA-C which includes d/c home with Doxycycline and Flagyl to cover for PID.  0500 - CT with no definitive acute intra-abdominal or pelvic findings to explain the patient's symptoms. 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 I have a low suspicion for this in the patient at this time; however, will refer to gastroenterology for further evaluation. Patient hemodynamically stable and appropriate for discharge with prescriptions for doxycycline, Flagyl, Zofran, and Percocet. PCP followup advised and return precautions provided. Patient agreeable to plan with no unaddressed concerns.   Filed Vitals:   09/29/13 2245 09/30/13 0359  BP: 109/65 118/70  Pulse: 84 70  Temp: 97.8 F (36.6 C)   TempSrc: Oral   Resp: 20 16  SpO2: 100% 100%     Antonietta Breach, PA-C 09/30/13 0515

## 2013-09-30 NOTE — ED Provider Notes (Signed)
Medical screening examination/treatment/procedure(s) were performed by non-physician practitioner and as supervising physician I was immediately available for consultation/collaboration.   EKG Interpretation None        Mariea Clonts, MD 09/30/13 [phone]

## 2013-09-30 NOTE — ED Notes (Signed)
Bed: VX48 Expected date:  Expected time:  Means of arrival:  Comments: Rm 7

## 2013-10-01 LAB — URINE CULTURE

## 2013-10-01 NOTE — ED Provider Notes (Signed)
Medical screening examination/treatment/procedure(s) were performed by non-physician practitioner and as supervising physician I was immediately available for consultation/collaboration.   EKG Interpretation None        Mariea Clonts, MD 10/01/13 [phone]

## 2014-02-12 ENCOUNTER — Encounter (HOSPITAL_COMMUNITY): Payer: Self-pay

## 2014-02-12 ENCOUNTER — Inpatient Hospital Stay (HOSPITAL_COMMUNITY): Payer: Medicaid Other

## 2014-02-12 ENCOUNTER — Inpatient Hospital Stay (HOSPITAL_COMMUNITY)
Admission: AD | Admit: 2014-02-12 | Discharge: 2014-02-12 | Disposition: A | Discharge status: Home / Self Care | Payer: Medicaid Other | Source: Ambulatory Visit | Attending: Obstetrics | Admitting: Obstetrics

## 2014-02-12 DIAGNOSIS — R109 Unspecified abdominal pain: Secondary | ICD-10-CM | POA: Insufficient documentation

## 2014-02-12 DIAGNOSIS — O99330 Smoking (tobacco) complicating pregnancy, unspecified trimester: Secondary | ICD-10-CM | POA: Insufficient documentation

## 2014-02-12 DIAGNOSIS — O210 Mild hyperemesis gravidarum: Secondary | ICD-10-CM | POA: Insufficient documentation

## 2014-02-12 DIAGNOSIS — O219 Vomiting of pregnancy, unspecified: Secondary | ICD-10-CM

## 2014-02-12 HISTORY — DX: Sickle-cell trait: D57.3

## 2014-02-12 LAB — URINALYSIS, ROUTINE W REFLEX MICROSCOPIC
BILIRUBIN URINE: NEGATIVE
Glucose, UA: NEGATIVE mg/dL
Hgb urine dipstick: NEGATIVE
Ketones, ur: NEGATIVE mg/dL
Leukocytes, UA: NEGATIVE
Nitrite: NEGATIVE
Protein, ur: NEGATIVE mg/dL
Specific Gravity, Urine: 1.015 (ref 1.005–1.030)
UROBILINOGEN UA: 0.2 mg/dL (ref 0.0–1.0)
pH: 7 (ref 5.0–8.0)

## 2014-02-12 LAB — POCT PREGNANCY, URINE: Preg Test, Ur: POSITIVE — AB

## 2014-02-12 MED ORDER — LACTATED RINGERS IV BOLUS (SEPSIS): 1000.0000 mL | Freq: Once | INTRAVENOUS | Status: DC

## 2014-02-12 MED ORDER — PROMETHAZINE HCL 25 MG/ML IJ SOLN: 12.5000 mg | Freq: Once | INTRAMUSCULAR | Status: DC

## 2014-02-12 MED ORDER — DOXYLAMINE-PYRIDOXINE 10-10 MG PO TBEC: DELAYED_RELEASE_TABLET | ORAL | Status: DC

## 2014-02-12 MED ORDER — PROMETHAZINE HCL 25 MG/ML IJ SOLN
12.5000 mg | Freq: Once | INTRAMUSCULAR | Status: AC
  Administered 2014-02-12: 14:00:00 via INTRAMUSCULAR
  Filled 2014-02-12: qty 1

## 2014-02-12 MED ORDER — PROMETHAZINE HCL 12.5 MG PO TABS: 12.5000 mg | ORAL_TABLET | Freq: Four times a day (QID) | ORAL | Status: DC | PRN

## 2014-02-12 NOTE — MAU Note (Signed)
N/v, not keeping down anything.  Cramping- esp at night.

## 2014-02-12 NOTE — Discharge Instructions (Signed)

## 2014-02-12 NOTE — MAU Provider Note (Signed)
First Provider Initiated Contact with Patient 02/12/14 1358      Chief Complaint:  Possible Pregnancy, Emesis and Abdominal Pain   Veronica Green is  33 y.o. [id number] at [redacted]w[redacted]d presents complaining of Possible Pregnancy, Emesis and Abdominal Pain 7.0 weeks by LMP, has had N&V off and on since yesterday.  Had lower abdominal cramping last night, followed by diarrhea.  Still having some cramping, no bleeding.   Plans to see Dr. Ruthann Cancer  Obstetrical/Gynecological History: OB History   Grav Para Term Preterm Abortions TAB SAB Ect Mult Living   5 3 3  0 1 1 0 0 0 3     Past Medical History: Past Medical History  Diagnosis Date  . No pertinent past medical history   . Infection     UTI  . Sickle cell trait     Past Surgical History: Past Surgical History  Procedure Laterality Date  . Dilation and curettage of uterus      Family History: Family History  Problem Relation Age of Onset  . Anesthesia problems Neg Hx   . Hypotension Neg Hx   . Malignant hyperthermia Neg Hx   . Pseudochol deficiency Neg Hx   . Asthma Mother   . Multiple sclerosis Sister   . Cancer Maternal Grandmother   . Cancer Paternal Grandmother     Social History: History  Substance Use Topics  . Smoking status: Current Some Day Smoker -- 0.50 packs/day    Types: Cigarettes  . Smokeless tobacco: Never Used     Comment: unable to smoke last 2 wks  . Alcohol Use: Yes     Comment: occ    Allergies: No Known Allergies  Meds:  No prescriptions prior to admission    Review of Systems   Constitutional: Negative for fever and chills Eyes: Negative for visual disturbances Respiratory: Negative for shortness of breath, dyspnea Cardiovascular: Negative for chest pain or palpitations  Gastrointestinal: Negative for constipation.  POSITIVE for diarrhea X1 Genitourinary: Negative for dysuria and urgency  POSITIVE for lower abdominal cramping Musculoskeletal: Negative for back pain, joint pain, myalgias   Neurological: Negative for dizziness and headaches     Physical Exam  Blood pressure 110/67, pulse 71, temperature 98.5 F (36.9 C), temperature source Oral, resp. rate 16, height 5\' 3"  (1.6 m), weight 44.815 kg (98 lb 12.8 oz), last menstrual period 12/25/2013, SpO2 100.00%, unknown if currently breastfeeding. GENERAL: Well-developed, well-nourished female in no acute distress.  LUNGS: Clear to auscultation bilaterally.  HEART: Regular rate and rhythm. ABDOMEN: Soft, nontender, nondistended  EXTREMITIES: Nontender, no edema, 2+ distal pulses. DTR's 2+  Labs: Results for orders placed during the hospital encounter of 02/12/14 (from the past 24 hour(s))  URINALYSIS, ROUTINE W REFLEX MICROSCOPIC   Collection Time    02/12/14 12:50 PM      Result Value Ref Range   Color, Urine YELLOW  YELLOW   APPearance HAZY (*) CLEAR   Specific Gravity, Urine 1.015  1.005 - 1.030   pH 7.0  5.0 - 8.0   Glucose, UA NEGATIVE  NEGATIVE mg/dL   Hgb urine dipstick NEGATIVE  NEGATIVE   Bilirubin Urine NEGATIVE  NEGATIVE   Ketones, ur NEGATIVE  NEGATIVE mg/dL   Protein, ur NEGATIVE  NEGATIVE mg/dL   Urobilinogen, UA 0.2  0.0 - 1.0 mg/dL   Nitrite NEGATIVE  NEGATIVE   Leukocytes, UA NEGATIVE  NEGATIVE  POCT PREGNANCY, URINE   Collection Time    02/12/14 12:54 PM  Result Value Ref Range   Preg Test, Ur POSITIVE (*) NEGATIVE   Imaging Studies:   CLINICAL DATA:  Pelvic pain.  Unsure of LMP.   EXAM: OBSTETRIC <14 WK ULTRASOUND   TECHNIQUE: Transabdominal ultrasound was performed for evaluation of the gestation as well as the maternal uterus and adnexal regions.   COMPARISON:  None.   FINDINGS: Intrauterine gestational sac: Visualized/normal in shape.   Yolk sac:  Visualized   Embryo:  Visualized   Cardiac Activity: Visualized   Heart Rate: 170 bpm   CRL:   17  mm   8 w 1 d                  [place] EDC: 09/23/2014   Maternal uterus/adnexae: Both ovaries are normal in appearance.  No mass or free fluid identified.   IMPRESSION: Single living IUP measuring 8 weeks 1 day with [place] EDC of 09/23/2014.   No significant maternal uterine or adnexal abnormality identified.      Assessment: Veronica Green is  33 y.o. [id number] at [redacted]w[redacted]d presents with IUP, nausea and vomiting of pregnancy, no dehydration.  Plan: Phenergan until Diclegis pre Josem Kaufmann goes through (faxed and given form to pt) F/U with Dr. Ruthann Cancer or OB of choice  CRESENZO-DISHMAN,Nickolas Chalfin 9/7/20153:52 PM

## 2014-02-12 NOTE — MAU Note (Signed)
Patient states she has missed her period and might be pregnant. Has had nausea, vomiting and lower abdominal pain for couple of days. Denies bleeding or discharge.

## 2014-02-19 ENCOUNTER — Inpatient Hospital Stay (HOSPITAL_COMMUNITY)
Admission: AD | Admit: 2014-02-19 | Discharge: 2014-02-20 | Disposition: A | Discharge status: Home / Self Care | Payer: Medicaid Other | Source: Ambulatory Visit | Attending: Obstetrics | Admitting: Obstetrics

## 2014-02-19 ENCOUNTER — Encounter (HOSPITAL_COMMUNITY): Payer: Self-pay | Admitting: *Deleted

## 2014-02-19 DIAGNOSIS — O219 Vomiting of pregnancy, unspecified: Secondary | ICD-10-CM

## 2014-02-19 DIAGNOSIS — O210 Mild hyperemesis gravidarum: Secondary | ICD-10-CM | POA: Diagnosis present

## 2014-02-19 LAB — URINALYSIS, ROUTINE W REFLEX MICROSCOPIC
Bilirubin Urine: NEGATIVE
GLUCOSE, UA: NEGATIVE mg/dL
Ketones, ur: >80 mg/dL — AB
Leukocytes, UA: NEGATIVE
Nitrite: NEGATIVE
PH: 6 (ref 5.0–8.0)
Protein, ur: NEGATIVE mg/dL
Specific Gravity, Urine: 1.025 (ref 1.005–1.030)
Urobilinogen, UA: 0.2 mg/dL (ref 0.0–1.0)

## 2014-02-19 LAB — URINE MICROSCOPIC-ADD ON

## 2014-02-19 LAB — POTASSIUM: Potassium: 3.3 mEq/L — ABNORMAL LOW (ref 3.7–5.3)

## 2014-02-19 MED ORDER — PROMETHAZINE HCL 25 MG/ML IJ SOLN
25.0000 mg | Freq: Once | INTRAMUSCULAR | Status: AC
  Administered 2014-02-19: 25 mg via INTRAVENOUS
  Filled 2014-02-19: qty 1

## 2014-02-19 MED ORDER — LACTATED RINGERS IV BOLUS (SEPSIS): 1000.0000 mL | Freq: Once | INTRAVENOUS | Status: DC

## 2014-02-19 MED ORDER — SODIUM CHLORIDE 0.9 % IV SOLN
Freq: Once | INTRAVENOUS | Status: AC
  Administered 2014-02-19: 22:00:00 via INTRAVENOUS
  Filled 2014-02-19: qty 1000

## 2014-02-19 MED ORDER — ACETAMINOPHEN 325 MG PO TABS
650.0000 mg | ORAL_TABLET | ORAL | Status: AC
  Administered 2014-02-19: 650 mg via ORAL
  Filled 2014-02-19: qty 2

## 2014-02-19 NOTE — MAU Note (Signed)
Has not TRIED to eat ANYTHING in 4 days.  Tried sips of water.  Is so dry

## 2014-02-19 NOTE — Discharge Instructions (Signed)
Use Diclegis at night every night Use Phenergan with nausea Morning Sickness Morning sickness is when you feel sick to your stomach (nauseous) during pregnancy. You may feel sick to your stomach and throw up (vomit). You may feel sick in the morning, but you can feel this way any time of day. Some women feel very sick to their stomach and cannot stop throwing up (hyperemesis gravidarum). HOME CARE  Only take medicines as told by your doctor.  Take multivitamins as told by your doctor. Taking multivitamins before getting pregnant can stop or lessen the harshness of morning sickness.  Eat dry toast or unsalted crackers before getting out of bed.  Eat 5 to 6 small meals a day.  Eat dry and bland foods like rice and baked potatoes.  Do not drink liquids with meals. Drink between meals.  Do not eat greasy, fatty, or spicy foods.  Have someone cook for you if the smell of food causes you to feel sick or throw up.  If you feel sick to your stomach after taking prenatal vitamins, take them at night or with a snack.  Eat protein when you need a snack (nuts, yogurt, cheese).  Eat unsweetened gelatins for dessert.  Wear a bracelet used for sea sickness (acupressure wristband).  Go to a doctor that puts thin needles into certain body points (acupuncture) to improve how you feel.  Do not smoke.  Use a humidifier to keep the air in your house free of odors.  Get lots of fresh air. GET HELP IF:  You need medicine to feel better.  You feel dizzy or lightheaded.  You are losing weight. GET HELP RIGHT AWAY IF:   You feel very sick to your stomach and cannot stop throwing up.  You pass out (faint). MAKE SURE YOU:  Understand these instructions.  Will watch your condition.  Will get help right away if you are not doing well or get worse. Document Released: 07/02/2004 Document Revised: 05/30/2013 Document Reviewed: 11/09/2012 [facility] Patient Information 2015 [place], [place]. This  information is not intended to replace advice given to you by your health care provider. Make sure you discuss any questions you have with your health care provider.

## 2014-02-19 NOTE — MAU Provider Note (Signed)
History     CSN: [id number]  Arrival date and time: 02/19/14 1700   First Provider Initiated Contact with Patient 02/19/14 1728      Chief Complaint  Patient presents with  . Morning Sickness   HPI Veronica Green 34 y.o. [id number] @[redacted]w[redacted]d  presents to MAU with complaints of nausea and vomiting.  She has not yet started prenatal care for this pregnancy.  She was seen in MAU one week ago for the same problem and discharged with rx for phenergan and diclegis.  She took both tablets together in the morning for a couple days.  It helped her to keep down a few bites.  She stopped taking the medications 5 days ago.  She has not eaten anything in four days.  She has vomited 4 times today.  When she vomits, it is yellow, white and Dinkins with a foamy texture.  She tried yogurt and crackers 4 days ago but could not keep them down.    OB History   Grav Para Term Preterm Abortions TAB SAB Ect Mult Living   5 3 3  0 1 1 0 0 0 3      Past Medical History  Diagnosis Date  . No pertinent past medical history   . Infection     UTI  . Sickle cell trait     Past Surgical History  Procedure Laterality Date  . Dilation and curettage of uterus      Family History  Problem Relation Age of Onset  . Anesthesia problems Neg Hx   . Hypotension Neg Hx   . Malignant hyperthermia Neg Hx   . Pseudochol deficiency Neg Hx   . Asthma Mother   . Multiple sclerosis Sister   . Cancer Maternal Grandmother   . Cancer Paternal Grandmother     History  Substance Use Topics  . Smoking status: Current Some Day Smoker -- 0.50 packs/day    Types: Cigarettes  . Smokeless tobacco: Never Used     Comment: unable to smoke last 2 wks  . Alcohol Use: Yes     Comment: occ    Allergies: No Known Allergies  Prescriptions prior to admission  Medication Sig Dispense Refill  . Doxylamine-Pyridoxine 10-10 MG TBEC 2 PO qhs; may take 1po in am and 1po in afternoon prn nausea  120 tablet  3  . promethazine (PHENERGAN) 12.5  MG tablet Take 1 tablet (12.5 mg total) by mouth every 6 (six) hours as needed for nausea or vomiting.  30 tablet  0    Review of Systems  Constitutional: Positive for chills and diaphoresis. Negative for fever.  HENT: Positive for sore throat. Negative for congestion.   Respiratory: Negative for cough, shortness of breath and wheezing.   Cardiovascular: Negative for chest pain and palpitations.  Gastrointestinal: Positive for nausea and vomiting. Negative for heartburn, abdominal pain, diarrhea and constipation.  Genitourinary: Negative for dysuria, frequency and hematuria.  Musculoskeletal: Positive for back pain.  Skin: Negative for itching and rash.  Neurological: Positive for tingling, weakness and headaches. Negative for dizziness.  Psychiatric/Behavioral: Positive for depression. Negative for suicidal ideas and substance abuse. The patient is not nervous/anxious.    Physical Exam   Blood pressure 116/82, pulse 72, temperature 98.6 F (37 C), temperature source Oral, resp. rate 16, last menstrual period 12/25/2013, unknown if currently breastfeeding.  Physical Exam  Constitutional: She is oriented to person, place, and time. She appears well-developed and well-nourished. No distress.  HENT:  Head: Normocephalic and atraumatic.  Eyes: EOM are normal.  Cardiovascular: Normal rate, regular rhythm and normal heart sounds.   Respiratory: Breath sounds normal. No respiratory distress.  GI: Soft. She exhibits no distension. There is no tenderness.  Hypoactive bowel sounds  Musculoskeletal: Normal range of motion.  Neurological: She is alert and oriented to person, place, and time.  Skin: Skin is warm and dry.  Psychiatric: She has a normal mood and affect.   Results for orders placed during the hospital encounter of 02/19/14 (from the past 24 hour(s))  POTASSIUM     Status: Abnormal   Collection Time    02/19/14  6:05 PM      Result Value Ref Range   Potassium 3.3 (*) 3.7 - 5.3  mEq/L  URINALYSIS, ROUTINE W REFLEX MICROSCOPIC     Status: Abnormal   Collection Time    02/19/14  9:05 PM      Result Value Ref Range   Color, Urine YELLOW  YELLOW   APPearance CLEAR  CLEAR   Specific Gravity, Urine 1.025  1.005 - 1.030   pH 6.0  5.0 - 8.0   Glucose, UA NEGATIVE  NEGATIVE mg/dL   Hgb urine dipstick TRACE (*) NEGATIVE   Bilirubin Urine NEGATIVE  NEGATIVE   Ketones, ur >80 (*) NEGATIVE mg/dL   Protein, ur NEGATIVE  NEGATIVE mg/dL   Urobilinogen, UA 0.2  0.0 - 1.0 mg/dL   Nitrite NEGATIVE  NEGATIVE   Leukocytes, UA NEGATIVE  NEGATIVE  URINE MICROSCOPIC-ADD ON     Status: Abnormal   Collection Time    02/19/14  9:05 PM      Result Value Ref Range   Squamous Epithelial / LPF FEW (*) RARE   WBC, UA 0-2  <3 WBC/hpf   RBC / HPF 0-2  <3 RBC/hpf   Bacteria, UA RARE  RARE   Urine-Other MUCOUS PRESENT      MAU Course  Procedures none MDM 1 liter LR given with phenergan infusion.  Pt noted to be feeling better.  HA is 4/6 and no further nausea. Tylenol given for HA.   Able to give urine sample.  Ketones >80 and Potassium at 3.3 therefore 1liter NS with 33meq K given.  Pt notes 100% improvement in nausea/vomiting and headache.    Assessment and Plan  A: Nausea and vomiting in pregnancy  P: Discharge to home Diclegis is to be taken daily at night - even if no nausea at that moment Phenergan may be taken up to TID with nausea Pt reports she has sufficient medications at home and does not need rx. Eat small amounts frequently Obtain [facility] asap Return to MAU for emergency  Paticia Stack 02/19/2014, 5:29 PM

## 2014-02-19 NOTE — MAU Note (Signed)
EMS started IV, gave 4 mg Zofran IVP and a 500cc bolus.

## 2014-02-20 NOTE — MAU Provider Note (Signed)
Attestation of Attending Supervision of Advanced Practitioner (PA/CNM/NP): Evaluation and management procedures were performed by the Advanced Practitioner under my supervision and collaboration.  I have reviewed the Advanced Practitioner's note and chart, and I agree with the management and plan.  Jacob Stinson, DO Attending Physician Faculty Practice, [facility]  

## 2014-04-06 LAB — OB RESULTS CONSOLE GC/CHLAMYDIA
Chlamydia: NEGATIVE
GC PROBE AMP, GENITAL: NEGATIVE

## 2014-04-06 LAB — OB RESULTS CONSOLE ABO/RH: RH Type: POSITIVE

## 2014-04-06 LAB — OB RESULTS CONSOLE HIV ANTIBODY (ROUTINE TESTING): HIV: NONREACTIVE

## 2014-04-06 LAB — OB RESULTS CONSOLE RUBELLA ANTIBODY, IGM: RUBELLA: IMMUNE

## 2014-04-06 LAB — OB RESULTS CONSOLE ANTIBODY SCREEN: Antibody Screen: NEGATIVE

## 2014-04-06 LAB — OB RESULTS CONSOLE HEPATITIS B SURFACE ANTIGEN: HEP B S AG: NEGATIVE

## 2014-04-06 LAB — OB RESULTS CONSOLE RPR: RPR: NONREACTIVE

## 2014-04-08 ENCOUNTER — Emergency Department (HOSPITAL_COMMUNITY)
Admission: EM | Admit: 2014-04-08 | Discharge: 2014-04-08 | Disposition: A | Discharge status: Home / Self Care | Payer: Medicaid Other | Attending: Emergency Medicine | Admitting: Emergency Medicine

## 2014-04-08 ENCOUNTER — Encounter (HOSPITAL_COMMUNITY): Payer: Self-pay | Admitting: *Deleted

## 2014-04-08 DIAGNOSIS — O99332 Smoking (tobacco) complicating pregnancy, second trimester: Secondary | ICD-10-CM | POA: Diagnosis not present

## 2014-04-08 DIAGNOSIS — Z3A15 15 weeks gestation of pregnancy: Secondary | ICD-10-CM | POA: Insufficient documentation

## 2014-04-08 DIAGNOSIS — Z87440 Personal history of urinary (tract) infections: Secondary | ICD-10-CM | POA: Insufficient documentation

## 2014-04-08 DIAGNOSIS — Y92410 Unspecified street and highway as the place of occurrence of the external cause: Secondary | ICD-10-CM | POA: Diagnosis not present

## 2014-04-08 DIAGNOSIS — Y9389 Activity, other specified: Secondary | ICD-10-CM | POA: Insufficient documentation

## 2014-04-08 DIAGNOSIS — F17210 Nicotine dependence, cigarettes, uncomplicated: Secondary | ICD-10-CM | POA: Diagnosis not present

## 2014-04-08 DIAGNOSIS — S3991XA Unspecified injury of abdomen, initial encounter: Secondary | ICD-10-CM | POA: Insufficient documentation

## 2014-04-08 DIAGNOSIS — R1030 Lower abdominal pain, unspecified: Secondary | ICD-10-CM

## 2014-04-08 DIAGNOSIS — S3992XA Unspecified injury of lower back, initial encounter: Secondary | ICD-10-CM | POA: Insufficient documentation

## 2014-04-08 DIAGNOSIS — Z79899 Other long term (current) drug therapy: Secondary | ICD-10-CM | POA: Insufficient documentation

## 2014-04-08 DIAGNOSIS — O9A212 Injury, poisoning and certain other consequences of external causes complicating pregnancy, second trimester: Secondary | ICD-10-CM | POA: Diagnosis present

## 2014-04-08 MED ORDER — ACETAMINOPHEN 325 MG PO TABS
650.0000 mg | ORAL_TABLET | Freq: Once | ORAL | Status: AC
  Administered 2014-04-08: 650 mg via ORAL
  Filled 2014-04-08: qty 2
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
## 2014-04-08 NOTE — Discharge Instructions (Signed)
Abdominal Pain During Pregnancy °Belly (abdominal) pain is common during pregnancy. Most of the time, it is not a serious problem. Other times, it can be a sign that something is wrong with the pregnancy. Always tell your doctor if you have belly pain. °HOME CARE °Monitor your belly pain for any changes. The following actions may help you feel better: °· Do not have sex (intercourse) or put anything in your vagina until you feel better. °· Rest until your pain stops. °· Drink clear fluids if you feel sick to your stomach (nauseous). Do not eat solid food until you feel better. °· Only take medicine as told by your doctor. °· Keep all doctor visits as told. °GET HELP RIGHT AWAY IF:  °· You are bleeding, leaking fluid, or pieces of tissue come out of your vagina. °· You have more pain or cramping. °· You keep throwing up (vomiting). °· You have pain when you pee (urinate) or have blood in your pee. °· You have a fever. °· You do not feel your baby moving as much. °· You feel very weak or feel like passing out. °· You have trouble breathing, with or without belly pain. °· You have a very bad headache and belly pain. °· You have fluid leaking from your vagina and belly pain. °· You keep having watery poop (diarrhea). °· Your belly pain does not go away after resting, or the pain gets worse. °MAKE SURE YOU:  °· Understand these instructions. °· Will watch your condition. °· Will get help right away if you are not doing well or get worse. °Document Released: 05/13/2009 Document Revised: 01/25/2013 Document Reviewed: 12/22/2012 °[employer]® Patient Information ©2015 [employer]. This information is not intended to replace advice given to you by your health care provider. Make sure you discuss any questions you have with your health care provider. ° °
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
## 2014-04-09 ENCOUNTER — Encounter (HOSPITAL_COMMUNITY): Payer: Self-pay | Admitting: *Deleted

## 2014-06-08 NOTE — L&D Delivery Note (Signed)
Delivery Note At 7:14 AM a viable female was delivered via  (Presentation: ;  ).  APGAR: , ; weight  .   Placenta status: , .  Cord:  with the following complications: .  Cord pH: not done  Anesthesia:   Episiotomy:   Lacerations:   Suture Repair: 2.0 Est. Blood Loss (mL):    Mom to postpartum.  Baby to Couplet care / Skin to Skin.  Fredrica Capano A 09/14/2014, 7:24 AM

## 2014-09-14 ENCOUNTER — Inpatient Hospital Stay (HOSPITAL_COMMUNITY): Payer: Medicaid Other

## 2014-09-14 ENCOUNTER — Inpatient Hospital Stay (HOSPITAL_COMMUNITY)
Admission: AD | Admit: 2014-09-14 | Discharge: 2014-09-16 | DRG: 775 | Disposition: A | Discharge status: Home / Self Care | Payer: Medicaid Other | Source: Ambulatory Visit | Attending: Obstetrics | Admitting: Obstetrics

## 2014-09-14 ENCOUNTER — Encounter (HOSPITAL_COMMUNITY): Payer: Self-pay | Admitting: *Deleted

## 2014-09-14 DIAGNOSIS — Z87891 Personal history of nicotine dependence: Secondary | ICD-10-CM | POA: Diagnosis not present

## 2014-09-14 DIAGNOSIS — Z820 Family history of epilepsy and other diseases of the nervous system: Secondary | ICD-10-CM | POA: Diagnosis not present

## 2014-09-14 DIAGNOSIS — Z825 Family history of asthma and other chronic lower respiratory diseases: Secondary | ICD-10-CM | POA: Diagnosis not present

## 2014-09-14 DIAGNOSIS — D573 Sickle-cell trait: Secondary | ICD-10-CM | POA: Diagnosis present

## 2014-09-14 DIAGNOSIS — O9902 Anemia complicating childbirth: Secondary | ICD-10-CM | POA: Diagnosis present

## 2014-09-14 DIAGNOSIS — IMO0002 Reserved for concepts with insufficient information to code with codable children: Secondary | ICD-10-CM

## 2014-09-14 DIAGNOSIS — Z3A38 38 weeks gestation of pregnancy: Secondary | ICD-10-CM | POA: Diagnosis present

## 2014-09-14 LAB — CBC
HEMATOCRIT: 35.7 % — AB (ref 36.0–46.0)
Hemoglobin: 13 g/dL (ref 12.0–15.0)
MCH: 32.7 pg (ref 26.0–34.0)
MCHC: 36.4 g/dL — ABNORMAL HIGH (ref 30.0–36.0)
MCV: 89.7 fL (ref 78.0–100.0)
Platelets: 277 10*3/uL (ref 150–400)
RBC: 3.98 MIL/uL (ref 3.87–5.11)
RDW: 13.5 % (ref 11.5–15.5)
WBC: 11.3 10*3/uL — AB (ref 4.0–10.5)

## 2014-09-14 LAB — TYPE AND SCREEN
ABO/RH(D): O POS
Antibody Screen: NEGATIVE

## 2014-09-14 LAB — RPR: RPR Ser Ql: NONREACTIVE

## 2014-09-14 MED ORDER — ONDANSETRON HCL 4 MG/2ML IJ SOLN: 4.0000 mg | Freq: Four times a day (QID) | INTRAMUSCULAR | Status: DC | PRN

## 2014-09-14 MED ORDER — FLEET ENEMA 7-19 GM/118ML RE ENEM: 1.0000 | ENEMA | RECTAL | Status: DC | PRN

## 2014-09-14 MED ORDER — OXYCODONE-ACETAMINOPHEN 5-325 MG PO TABS: 2.0000 | ORAL_TABLET | ORAL | Status: DC | PRN

## 2014-09-14 MED ORDER — LACTATED RINGERS IV SOLN: 500.0000 mL | INTRAVENOUS | Status: DC | PRN

## 2014-09-14 MED ORDER — ONDANSETRON HCL 4 MG/2ML IJ SOLN: 4.0000 mg | INTRAMUSCULAR | Status: DC | PRN

## 2014-09-14 MED ORDER — SENNOSIDES-DOCUSATE SODIUM 8.6-50 MG PO TABS
2.0000 | ORAL_TABLET | ORAL | Status: DC
  Administered 2014-09-14 – 2014-09-15 (×2): 2 via ORAL
  Filled 2014-09-14 (×2): qty 2

## 2014-09-14 MED ORDER — OXYTOCIN 40 UNITS IN LACTATED RINGERS INFUSION - SIMPLE MED
62.5000 mL/h | INTRAVENOUS | Status: DC
  Administered 2014-09-14: 999 mL/h via INTRAVENOUS
  Filled 2014-09-14: qty 1000

## 2014-09-14 MED ORDER — SODIUM CHLORIDE 0.9 % IV SOLN
2.0000 g | Freq: Once | INTRAVENOUS | Status: AC
  Administered 2014-09-14: 2 g via INTRAVENOUS
  Filled 2014-09-14: qty 2000

## 2014-09-14 MED ORDER — TETANUS-DIPHTH-ACELL PERTUSSIS 5-2.5-18.5 LF-MCG/0.5 IM SUSP: 0.5000 mL | Freq: Once | INTRAMUSCULAR | Status: DC

## 2014-09-14 MED ORDER — LANOLIN HYDROUS EX OINT: TOPICAL_OINTMENT | CUTANEOUS | Status: DC | PRN

## 2014-09-14 MED ORDER — CITRIC ACID-SODIUM CITRATE 334-500 MG/5ML PO SOLN: 30.0000 mL | ORAL | Status: DC | PRN

## 2014-09-14 MED ORDER — OXYCODONE-ACETAMINOPHEN 5-325 MG PO TABS
1.0000 | ORAL_TABLET | ORAL | Status: DC | PRN
Start: 2014-09-14 — End: 2014-09-16
  Administered 2014-09-14 – 2014-09-16 (×7): 1 via ORAL
  Filled 2014-09-14 (×4): qty 1

## 2014-09-14 MED ORDER — WITCH HAZEL-GLYCERIN EX PADS: 1.0000 "application " | MEDICATED_PAD | CUTANEOUS | Status: DC | PRN

## 2014-09-14 MED ORDER — LIDOCAINE HCL (PF) 1 % IJ SOLN
30.0000 mL | INTRAMUSCULAR | Status: DC | PRN
  Filled 2014-09-14: qty 30

## 2014-09-14 MED ORDER — OXYTOCIN BOLUS FROM INFUSION: 500.0000 mL | INTRAVENOUS | Status: DC

## 2014-09-14 MED ORDER — ACETAMINOPHEN 325 MG PO TABS: 650.0000 mg | ORAL_TABLET | ORAL | Status: DC | PRN

## 2014-09-14 MED ORDER — PRENATAL MULTIVITAMIN CH
1.0000 | ORAL_TABLET | Freq: Every day | ORAL | Status: DC
  Administered 2014-09-15 – 2014-09-16 (×2): 1 via ORAL
  Filled 2014-09-14 (×2): qty 1

## 2014-09-14 MED ORDER — FERROUS SULFATE 325 (65 FE) MG PO TABS
325.0000 mg | ORAL_TABLET | Freq: Two times a day (BID) | ORAL | Status: DC
  Administered 2014-09-14 – 2014-09-16 (×3): 325 mg via ORAL
  Filled 2014-09-14 (×3): qty 1

## 2014-09-14 MED ORDER — DIPHENHYDRAMINE HCL 25 MG PO CAPS: 25.0000 mg | ORAL_CAPSULE | Freq: Four times a day (QID) | ORAL | Status: DC | PRN

## 2014-09-14 MED ORDER — OXYCODONE-ACETAMINOPHEN 5-325 MG PO TABS
1.0000 | ORAL_TABLET | ORAL | Status: DC | PRN
  Administered 2014-09-15: 1 via ORAL
  Filled 2014-09-14 (×4): qty 1

## 2014-09-14 MED ORDER — DIBUCAINE 1 % RE OINT: 1.0000 "application " | TOPICAL_OINTMENT | RECTAL | Status: DC | PRN

## 2014-09-14 MED ORDER — PROMETHAZINE HCL 25 MG/ML IJ SOLN
12.5000 mg | Freq: Once | INTRAMUSCULAR | Status: DC
  Filled 2014-09-14: qty 1

## 2014-09-14 MED ORDER — BUTORPHANOL TARTRATE 1 MG/ML IJ SOLN
1.0000 mg | INTRAMUSCULAR | Status: DC | PRN
  Administered 2014-09-14: 1 mg via INTRAVENOUS
  Filled 2014-09-14: qty 1

## 2014-09-14 MED ORDER — BENZOCAINE-MENTHOL 20-0.5 % EX AERO: 1.0000 "application " | INHALATION_SPRAY | CUTANEOUS | Status: DC | PRN

## 2014-09-14 MED ORDER — LACTATED RINGERS IV SOLN
INTRAVENOUS | Status: DC
  Administered 2014-09-14: 05:00:00 via INTRAVENOUS

## 2014-09-14 MED ORDER — ONDANSETRON HCL 4 MG PO TABS: 4.0000 mg | ORAL_TABLET | ORAL | Status: DC | PRN

## 2014-09-14 MED ORDER — IBUPROFEN 600 MG PO TABS
600.0000 mg | ORAL_TABLET | Freq: Four times a day (QID) | ORAL | Status: DC
  Administered 2014-09-14 – 2014-09-16 (×9): 600 mg via ORAL
  Filled 2014-09-14 (×9): qty 1

## 2014-09-14 MED ORDER — ZOLPIDEM TARTRATE 5 MG PO TABS
5.0000 mg | ORAL_TABLET | Freq: Every evening | ORAL | Status: DC | PRN
Start: 2014-09-14 — End: 2014-09-16

## 2014-09-14 MED ORDER — SIMETHICONE 80 MG PO CHEW: 80.0000 mg | CHEWABLE_TABLET | ORAL | Status: DC | PRN

## 2014-09-14 NOTE — Progress Notes (Signed)
Patient ID: Veronica Green, female   DOB: 1981-03-21, 34 y.o.   MRN: [id number] Blood pressure 123/88 pulse 84 She received milligram of Stadol she is now 8 cm 90% vertex -2-3 amniotomy performed and the fluid is clear

## 2014-09-14 NOTE — MAU Note (Signed)
Pt reports contractions, denies bleeding or ROM.  

## 2014-09-14 NOTE — H&P (Signed)
Veronica Green is a 34 y.o. female presenting for UC's. Maternal Medical History:  Reason for admission: Contractions.   Fetal activity: Perceived fetal activity is normal.   Last perceived fetal movement was within the past hour.    Prenatal complications: no prenatal complications   OB History    Gravida Para Term Preterm AB TAB SAB Ectopic Multiple Living   5 3 3  0 1 1 0 0 0 3     Past Medical History  Diagnosis Date  . No pertinent past medical history   . Infection     UTI  . Sickle cell trait    Past Surgical History  Procedure Laterality Date  . Dilation and curettage of uterus     Family History: family history includes Asthma in her mother; Cancer in her maternal grandmother and paternal grandmother; Multiple sclerosis in her sister. There is no history of Anesthesia problems, Hypotension, Malignant hyperthermia, or Pseudochol deficiency. Social History:  reports that she has quit smoking. Her smoking use included Cigarettes. She smoked 0.50 packs per day. She has never used smokeless tobacco. She reports that she does not drink alcohol or use illicit drugs.   Prenatal Transfer Tool  Maternal Diabetes: No Genetic Screening: Normal Maternal Ultrasounds/Referrals: Normal Fetal Ultrasounds or other Referrals:  None Maternal Substance Abuse:  No Significant Maternal Medications:  None Significant Maternal Lab Results:  None Other Comments:  None  Review of Systems  All other systems reviewed and are negative.   Dilation: 6 Effacement (%): 80 Station: -2 Exam by:: Building services engineer Blood pressure 123/88, pulse 84, temperature 97.5 F (36.4 C), temperature source Oral, resp. rate 20, height 5\' 3"  (1.6 m), weight 142 lb (64.411 kg), last menstrual period 12/25/2013, SpO2 100 %. Maternal Exam:  Uterine Assessment: Contraction strength is moderate.  Contraction frequency is regular.   Abdomen: Patient reports no abdominal tenderness. Fetal presentation:  vertex  Introitus: Normal vulva. Normal vagina.  Pelvis: adequate for delivery.   Cervix: Cervix evaluated by digital exam.     Physical Exam  Nursing note and vitals reviewed. Constitutional: She is oriented to person, place, and time. She appears well-developed and well-nourished.  HENT:  Head: Normocephalic and atraumatic.  Eyes: Conjunctivae are normal. Pupils are equal, round, and reactive to light.  Neck: Normal range of motion. Neck supple.  Cardiovascular: Normal rate and regular rhythm.   Respiratory: Effort normal.  GI: Soft.  Genitourinary: Vagina normal and uterus normal.  Musculoskeletal: Normal range of motion.  Neurological: She is alert and oriented to person, place, and time.  Skin: Skin is warm and dry.  Psychiatric: She has a normal mood and affect. Her behavior is normal. Judgment and thought content normal.    Prenatal labs: ABO, Rh: O/Positive/-- (10/30 0000) Antibody: Negative (10/30 0000) Rubella: Immune (10/30 0000) RPR: Nonreactive (10/30 0000)  HBsAg: Negative (10/30 0000)  HIV: Non-reactive (10/30 0000)  GBS:     Assessment/Plan: 38 weeks.  Active labor.  Admit.   Kimala Horne A 09/14/2014, 6:00 AM

## 2014-09-15 LAB — CBC
HCT: 29.8 % — ABNORMAL LOW (ref 36.0–46.0)
HEMOGLOBIN: 10.8 g/dL — AB (ref 12.0–15.0)
MCH: 32.3 pg (ref 26.0–34.0)
MCHC: 35.9 g/dL (ref 30.0–36.0)
MCV: 90 fL (ref 78.0–100.0)
Platelets: 221 10*3/uL (ref 150–400)
RBC: 3.31 MIL/uL — ABNORMAL LOW (ref 3.87–5.11)
RDW: 13.7 % (ref 11.5–15.5)
WBC: 10.6 10*3/uL — ABNORMAL HIGH (ref 4.0–10.5)

## 2014-09-15 NOTE — Progress Notes (Signed)
Patient ID: Veronica Green, female   DOB: 23-Apr-1981, 34 y.o.   MRN: [id number] Postpartum day one blood pressure 97/55 respirations 18 pulse 65 Postpartum day one her bile signs are normal she has no complaints the fundus is firm lochia moderate legs are negative doing well

## 2014-09-16 MED ORDER — MEDROXYPROGESTERONE ACETATE 150 MG/ML IM SUSP
150.0000 mg | Freq: Once | INTRAMUSCULAR | Status: AC
  Administered 2014-09-16: 150 mg via INTRAMUSCULAR
  Filled 2014-09-16: qty 1

## 2014-09-16 NOTE — Discharge Summary (Signed)
Obstetric Discharge Summary Reason for Admission: induction of labor Prenatal Procedures: none Intrapartum Procedures: spontaneous vaginal delivery Postpartum Procedures: none Complications-Operative and Postpartum: none HEMOGLOBIN  Date Value Ref Range Status  09/15/2014 10.8* 12.0 - 15.0 g/dL Final    Comment:    REPEATED TO VERIFY DELTA CHECK NOTED    HCT  Date Value Ref Range Status  09/15/2014 29.8* 36.0 - 46.0 % Final    Physical Exam:  General: alert Lochia: appropriate Uterine Fundus: firm Incision: healing well DVT Evaluation: No evidence of DVT seen on physical exam.  Discharge Diagnoses: Term Pregnancy-delivered  Discharge Information: Date: 09/16/2014 Activity: pelvic rest Diet: routine Medications: Iron and Percocet Condition: stable Instructions: refer to practice specific booklet Discharge to: home Follow-up Information    Follow up with Frederico Hamman, MD.   Specialty:  Obstetrics and Gynecology   Contact information:   [address] [place] [address] [phone]       Newborn Data: Live born female  Birth Weight: 7 lb 1.4 oz (3215 g) APGAR: 9, 9  Home with mother.  Dhriti Fales A 09/16/2014, 6:53 AM

## 2014-09-16 NOTE — Progress Notes (Signed)
Patient ID: Veronica Green, female   DOB: 1981/03/05, 34 y.o.   MRN: [id number] Postpartum day 2 Vital signs normal Fundus firm Lochia moderate On today

## 2014-09-16 NOTE — Discharge Instructions (Signed)
Discharge instructions   You can wash your hair  Shower  Eat what you want  Drink what you want  See me in 6 weeks  Your ankles are going to swell more in the next 2 weeks than when pregnant  No sex for 6 weeks   Griffen Frayne A, MD 09/16/2014

## 2014-09-18 NOTE — Progress Notes (Signed)
Post discharge chart review completed.  

## 2015-05-11 ENCOUNTER — Encounter (HOSPITAL_COMMUNITY): Payer: Self-pay | Admitting: *Deleted

## 2015-05-11 ENCOUNTER — Emergency Department (HOSPITAL_COMMUNITY): Payer: Medicaid Other

## 2015-05-11 ENCOUNTER — Emergency Department (HOSPITAL_COMMUNITY)
Admission: EM | Admit: 2015-05-11 | Discharge: 2015-05-11 | Disposition: A | Discharge status: Home / Self Care | Payer: Medicaid Other | Attending: Emergency Medicine | Admitting: Emergency Medicine

## 2015-05-11 DIAGNOSIS — R05 Cough: Secondary | ICD-10-CM | POA: Diagnosis present

## 2015-05-11 DIAGNOSIS — Z87440 Personal history of urinary (tract) infections: Secondary | ICD-10-CM | POA: Diagnosis not present

## 2015-05-11 DIAGNOSIS — J189 Pneumonia, unspecified organism: Secondary | ICD-10-CM

## 2015-05-11 DIAGNOSIS — F17210 Nicotine dependence, cigarettes, uncomplicated: Secondary | ICD-10-CM | POA: Diagnosis not present

## 2015-05-11 DIAGNOSIS — J159 Unspecified bacterial pneumonia: Secondary | ICD-10-CM | POA: Diagnosis not present

## 2015-05-11 DIAGNOSIS — Z862 Personal history of diseases of the blood and blood-forming organs and certain disorders involving the immune mechanism: Secondary | ICD-10-CM | POA: Insufficient documentation

## 2015-05-11 MED ORDER — BENZONATATE 100 MG PO CAPS
100.0000 mg | ORAL_CAPSULE | Freq: Once | ORAL | Status: AC
  Administered 2015-05-11: 100 mg via ORAL
  Filled 2015-05-11: qty 1

## 2015-05-11 MED ORDER — AZITHROMYCIN 250 MG PO TABS: 250.0000 mg | ORAL_TABLET | Freq: Every day | ORAL | Status: DC

## 2015-05-11 MED ORDER — BENZONATATE 100 MG PO CAPS: 100.0000 mg | ORAL_CAPSULE | Freq: Three times a day (TID) | ORAL | Status: DC

## 2015-05-11 NOTE — ED Provider Notes (Signed)
CSN: [id number]     Arrival date & time 05/11/15  1634 History  By signing my name below, I, Starleen Arms, attest that this documentation has been prepared under the direction and in the presence of Bernerd Limbo, [place]. Electronically Signed: Starleen Arms ED Scribe. 05/11/2015. 5:19 PM.    Chief Complaint  Patient presents with  . Sinus Problem    The history is provided by the patient. No language interpreter was used.    HPI Comments: Veronica Green is a 34 y.o. female who presents to the Emergency Department complaining of cough productive of green/yellow sputum onset 1-2 weeks ago. She denies exacerbating factors. She states she has tried nyquil for her symptoms without significant relief. Associated symptoms include nasal congestion, sinus pressure, headache, SOB with cough only, chills, subjective fever. She denies CP.    Past Medical History  Diagnosis Date  . No pertinent past medical history   . Infection     UTI  . Sickle cell trait [facility])    Past Surgical History  Procedure Laterality Date  . Dilation and curettage of uterus     Family History  Problem Relation Age of Onset  . Anesthesia problems Neg Hx   . Hypotension Neg Hx   . Malignant hyperthermia Neg Hx   . Pseudochol deficiency Neg Hx   . Asthma Mother   . Multiple sclerosis Sister   . Cancer Maternal Grandmother   . Cancer Paternal Grandmother    Social History  Substance Use Topics  . Smoking status: Current Every Day Smoker -- 0.50 packs/day    Types: Cigarettes  . Smokeless tobacco: Never Used     Comment: unable to smoke last 2 wks  . Alcohol Use: No     Comment: occ   OB History    Gravida Para Term Preterm AB TAB SAB Ectopic Multiple Living   5 4 4  0 1 1 0 0 0 4      Review of Systems  Constitutional: Positive for fever and chills.  HENT: Positive for congestion.   Respiratory: Positive for shortness of breath.   Cardiovascular: Negative for chest pain.  Neurological: Positive for  headaches.    Allergies  Review of patient's allergies indicates no known allergies.  Home Medications   Prior to Admission medications   Medication Sig Start Date End Date Taking? Authorizing Provider  acetaminophen (TYLENOL) 500 MG tablet Take 1,000 mg by mouth every 6 (six) hours as needed for moderate pain.   Yes Historical Provider, MD  Pseudoeph-Doxylamine-DM-APAP (NYQUIL PO) Take 30 mLs by mouth every 4 (four) hours as needed (cold symptoms).   Yes Historical Provider, MD  azithromycin (ZITHROMAX) 250 MG tablet Take 1 tablet (250 mg total) by mouth daily. Take first 2 tablets together, then 1 every day until finished. 05/11/15   Marella Chimes, PA-C  benzonatate (TESSALON) 100 MG capsule Take 1 capsule (100 mg total) by mouth every 8 (eight) hours. 05/11/15   Marella Chimes, PA-C    BP 103/88 mmHg  Pulse 91  Temp(Src) 98.4 F (36.9 C) (Oral)  Resp 18  SpO2 99%  LMP 04/24/2015 Physical Exam  Constitutional: She is oriented to person, place, and time. She appears well-developed and well-nourished. No distress.  HENT:  Head: Normocephalic and atraumatic.  Right Ear: External ear normal.  Left Ear: External ear normal.  Nose: Right sinus exhibits maxillary sinus tenderness and frontal sinus tenderness. Left sinus exhibits maxillary sinus tenderness and frontal sinus tenderness.  Mouth/Throat:  Uvula is midline, oropharynx is clear and moist and mucous membranes are normal. No oropharyngeal exudate, posterior oropharyngeal edema, posterior oropharyngeal erythema or tonsillar abscesses.  Eyes: Conjunctivae and EOM are normal. Pupils are equal, round, and reactive to light. Right eye exhibits no discharge. Left eye exhibits no discharge. No scleral icterus.  Neck: Normal range of motion. Neck supple.  Cardiovascular: Normal rate, regular rhythm, normal heart sounds and intact distal pulses.   Pulmonary/Chest: Effort normal and breath sounds normal. No respiratory distress.  She has no wheezes. She has no rales.  Musculoskeletal: Normal range of motion. She exhibits no edema or tenderness.  Neurological: She is alert and oriented to person, place, and time.  Skin: Skin is warm and dry. She is not diaphoretic.  Psychiatric: She has a normal mood and affect. Her behavior is normal.  Nursing note and vitals reviewed.   ED Course  Procedures (including critical care time)  DIAGNOSTIC STUDIES: Oxygen Saturation is 98% on RA, normal by my interpretation.    COORDINATION OF CARE: 5:22 PM Will order anti-tussive and CXR.  Patient acknowledges and agrees with plan.    Labs Review Labs Reviewed - No data to display  Imaging Review Dg Chest 2 View  05/11/2015  CLINICAL DATA:  Productive cough.  Unintended weight loss. EXAM: CHEST - 2 VIEW COMPARISON:  Two-view chest x-ray 09/09/2013. FINDINGS: The heart size is normal. Left lower lobe airspace disease is new. The right lung is clear. IMPRESSION: 1. Left lower lobe pneumonia. 2. Recommend follow-up chest x-ray after appropriate therapy and resolution of acute symptoms. Electronically Signed   By: San Morelle M.D.   On: 05/11/2015 18:30     I have personally reviewed and evaluated these images and lab results as part of my medical decision-making.   EKG Interpretation None      MDM   Final diagnoses:  CAP (community acquired pneumonia)    34 year old female presents with nasal congestion, cough productive of green/yellow sputum, and sinus pressure x 1-2 weeks. Patient is afebrile. Vital signs stable. TTP of maxillary and frontal sinuses bilaterally. Posterior oropharynx without significant erythema, edema, or exudate. Heart RRR. Lungs clear to auscultation bilaterally.   Patient given tessalon for cough. Will obtain CXR given chronicity of symptoms.   Imaging remarkable for left lower lobe pneumonia. Spoke with patient regarding findings. Will treat CAP with azithromycin and give cough medicine for  additional symptom relief. Patient to follow-up with PCP. Return precautions discussed. Patient verbalizes her understanding and is in agreement with plan.  BP 103/88 mmHg  Pulse 91  Temp(Src) 98.4 F (36.9 C) (Oral)  Resp 18  SpO2 99%  LMP 04/24/2015  I personally performed the services described in this documentation, which was scribed in my presence. The recorded information has been reviewed and is accurate.    Marella Chimes, PA-C 05/11/15 2338  Lacretia Leigh, MD 05/11/15 [phone]

## 2015-05-11 NOTE — ED Notes (Signed)
Patient states she has had cough productive of sputum and nasal congestion x1 week.  Patient denies fever, but states she has vomited x1 over the past week.  Patient denies diarrhea, otalgia and sore throat.

## 2015-05-11 NOTE — Discharge Instructions (Signed)
1. Medications: azithromycin, tessalon, usual home medications 2. Treatment: rest, drink plenty of fluids 3. Follow Up: please followup with your primary doctor this week for discussion of your diagnoses and further evaluation after today's visit; if you do not have a primary care doctor use the resource guide provided to find one; please return to the ER for high fever, chest pain, shortness of breath, new or worsening symptoms   Community-Acquired Pneumonia, Adult Pneumonia is an infection of the lungs. There are different types of pneumonia. One type can develop while a person is in a hospital. A different type, called community-acquired pneumonia, develops in people who are not, or have not recently been, in the hospital or other health care facility.  CAUSES Pneumonia may be caused by bacteria, viruses, or funguses. Community-acquired pneumonia is often caused by Streptococcus pneumonia bacteria. These bacteria are often passed from one person to another by breathing in droplets from the cough or sneeze of an infected person. RISK FACTORS The condition is more likely to develop in:  People who havechronic diseases, such as chronic obstructive pulmonary disease (COPD), asthma, congestive heart failure, cystic fibrosis, diabetes, or kidney disease.  People who haveearly-stage or late-stage HIV.  People who havesickle cell disease.  People who havehad their spleen removed (splenectomy).  People who havepoor Human resources officer.  People who havemedical conditions that increase the risk of breathing in (aspirating) secretions their own mouth and nose.   People who havea weakened immune system (immunocompromised).  People who smoke.  People whotravel to areas where pneumonia-causing germs commonly exist.  People whoare around animal habitats or animals that have pneumonia-causing germs, including birds, bats, rabbits, cats, and farm animals. SYMPTOMS Symptoms of this condition  include:  Adry cough.  A wet (productive) cough.  Fever.  Sweating.  Chest pain, especially when breathing deeply or coughing.  Rapid breathing or difficulty breathing.  Shortness of breath.  Shaking chills.  Fatigue.  Muscle aches. DIAGNOSIS Your health care provider will take a medical history and perform a physical exam. You may also have other tests, including:  Imaging studies of your chest, including X-rays.  Tests to check your blood oxygen level and other blood gases.  Other tests on blood, mucus (sputum), fluid around your lungs (pleural fluid), and urine. If your pneumonia is severe, other tests may be done to identify the specific cause of your illness. TREATMENT The type of treatment that you receive depends on many factors, such as the cause of your pneumonia, the medicines you take, and other medical conditions that you have. For most adults, treatment and recovery from pneumonia may occur at home. In some cases, treatment must happen in a hospital. Treatment may include:  Antibiotic medicines, if the pneumonia was caused by bacteria.  Antiviral medicines, if the pneumonia was caused by a virus.  Medicines that are given by mouth or through an IV tube.  Oxygen.  Respiratory therapy. Although rare, treating severe pneumonia may include:  Mechanical ventilation. This is done if you are not breathing well on your own and you cannot maintain a safe blood oxygen level.  Thoracentesis. This procedureremoves fluid around one lung or both lungs to help you breathe better. HOME CARE INSTRUCTIONS  Take over-the-counter and prescription medicines only as told by your health care provider.  Only takecough medicine if you are losing sleep. Understand that cough medicine can prevent your body's natural ability to remove mucus from your lungs.  If you were prescribed an antibiotic medicine, take it  as told by your health care provider. Do not stop taking the  antibiotic even if you start to feel better.  Sleep in a semi-upright position at night. Try sleeping in a reclining chair, or place a few pillows under your head.  Do not use tobacco products, including cigarettes, chewing tobacco, and e-cigarettes. If you need help quitting, ask your health care provider.  Drink enough water to keep your urine clear or pale yellow. This will help to thin out mucus secretions in your lungs. PREVENTION There are ways that you can decrease your risk of developing community-acquired pneumonia. Consider getting a pneumococcal vaccine if:  You are older than 33 years of age.  You are older than 33 years of age and are undergoing cancer treatment, have chronic lung disease, or have other medical conditions that affect your immune system. Ask your health care provider if this applies to you. There are different types and schedules of pneumococcal vaccines. Ask your health care provider which vaccination option is best for you. You may also prevent community-acquired pneumonia if you take these actions:  Get an influenza vaccine every year. Ask your health care provider which type of influenza vaccine is best for you.  Go to the dentist on a regular basis.  Wash your hands often. Use hand sanitizer if soap and water are not available. SEEK MEDICAL CARE IF:  You have a fever.  You are losing sleep because you cannot control your cough with cough medicine. SEEK IMMEDIATE MEDICAL CARE IF:  You have worsening shortness of breath.  You have increased chest pain.  Your sickness becomes worse, especially if you are an older adult or have a weakened immune system.  You cough up blood.   This information is not intended to replace advice given to you by your health care provider. Make sure you discuss any questions you have with your health care provider.   Document Released: 05/25/2005 Document Revised: 02/13/2015 Document Reviewed: 09/19/2014 Elsevier  Interactive Patient Education 2016 [employer].   Emergency Department Resource Guide 1) Find a Doctor and Pay Out of Pocket Although you won't have to find out who is covered by your insurance plan, it is a good idea to ask around and get recommendations. You will then need to call the office and see if the doctor you have chosen will accept you as a new patient and what types of options they offer for patients who are self-pay. Some doctors offer discounts or will set up payment plans for their patients who do not have insurance, but you will need to ask so you aren't surprised when you get to your appointment.  2) Contact Your Local Health Department Not all health departments have doctors that can see patients for sick visits, but many do, so it is worth a call to see if yours does. If you don't know where your local health department is, you can check in your phone book. The CDC also has a tool to help you locate your state's health department, and many state websites also have listings of all of their local health departments.  3) Find a [facility] If your illness is not likely to be very severe or complicated, you may want to try a walk in clinic. These are popping up all over the country in pharmacies, drugstores, and shopping centers. They're usually staffed by nurse practitioners or physician assistants that have been trained to treat common illnesses and complaints. They're usually fairly quick and inexpensive.  However, if you have serious medical issues or chronic medical problems, these are probably not your best option.  No Primary Care Doctor: - Call Health Connect at  [phone] - they can help you locate a primary care doctor that  accepts your insurance, provides certain services, etc. - Physician Referral Service- [phone]  Chronic Pain Problems: Organization         Address  Phone   Notes  [facility]  [phone] Patients need to be referred by  their primary care doctor.   Medication Assistance: Organization         Address  Phone   Notes  [facility] Medication [facility] [address] [place], [place] [address] [phone] --Must be a resident of [facility] -- Must have NO insurance coverage whatsoever (no Medicaid/ Medicare, etc.) -- The pt. MUST have a primary care doctor that directs their care regularly and follows them in the community   MedAssist  [phone]   [employer]  [phone]    Agencies that provide inexpensive medical care: Organization         Address  Phone   Notes  [place]  [phone]   [facility]    [phone]   [facility] [place], [place] [address] [phone]   [place] [address] [place] [phone]   Planned Parenthood    [phone]   [facility]    [phone]   [place] and [address], [place] Phone:  [phone], Fax:  [phone] Hours of Operation:  9 am - 6 pm, M-F.  Also accepts Medicaid/Medicare and self-pay.  [facility] for [place], [address], [place] Phone: [phone], Fax: [phone]. Hours of Operation:  8:30 am - 5:30 pm, M-F.  Also accepts Medicaid and self-pay.  [facility] [place] [address] [place] Phone: [phone]   [place], [place], [place] [phone], Ext. [phone] Mondays & Thursdays: 7-9 AM.  First 15 patients are seen on a first come, first serve basis.    [place] Providers:  Organization         Address  Phone   Notes  [facility] [address], [place] [phone] Also accepts self-pay patients.  [facility] [id number] [place], [place]  [phone]   [place], [address], [place] [phone]   [facility] [address], [place] [phone]   Lucianne Lei [address], [place]   [phone] Only accepts [place] Access [place] patients after they have their name applied to their card.   Self-Pay (no insurance) in [facility]:  Organization         Address  Phone   Notes  Sickle Cell Patients, [facility] [place] [phone]   [facility] [place] [phone]   [facility] [place]  [place], [place], [place] [phone]   [facility]/Dr. Osei-Bonsu  [address], [address] or [address], [place] [phone] Phone number for both [place] and [place] locations is the same.  [facility]gent [facility] and [facility] [address], [place] [phone]   [facility] [address], [place] or [address] [phone]   [facility] [address] [place] [phone], phone; [phone], fax Sees patients 1st and 3rd Saturday of every month.  Must not qualify for public or private insurance (i.e. Medicaid, Medicare, [place] Health Choice, Veterans' Benefits)  Household income should be no more than 200% of the poverty level The clinic cannot treat you if you are pregnant or think you are pregnant  Sexually transmitted diseases are not treated at the clinic.    Dental Care: Organization         Address  Phone  Notes  [facility] Department of [facility] [place] [phone] Accepts children up to age 30 who are enrolled in [place] or [place]; pregnant women with a Medicaid card; and children who have applied for Medicaid or [place] Health Choice, but were declined, whose parents can pay a reduced  fee at time of service.  [facility] Department of [facility]  [address], [place] [phone] Accepts children up to age 59 who are enrolled in [place] or [place]; pregnant women with a Medicaid card; and children who have applied for Medicaid or [place] Health Choice, but were declined, whose parents can pay a reduced fee at time of service.  [place] Adult Dental Access PROGRAM  [place] [phone] Patients are seen by appointment only. Walk-ins are not accepted. [place] will see patients 27 years of age and older. Monday - Tuesday (8am-5pm) Most Wednesdays (8:30-5pm) $30 per visit, cash only  [facility] Adult Dental Access PROGRAM  [address], [facility] [phone] Patients are seen by appointment only. Walk-ins are not accepted. [place] will see patients 55 years of age and older. One Wednesday Evening (Monthly: Volunteer Based).  $30 per visit, cash only  [place]  [phone] for adults; Children under age 46, call Graduate Pediatric Dentistry at [phone]. Children aged 78-14, please call [phone] to request a pediatric application.  Dental services are provided in all areas of dental care including fillings, crowns and bridges, complete and partial dentures, implants, gum treatment, root canals, and extractions. Preventive care is also provided. Treatment is provided to both adults and children. Patients are selected via a lottery and there is often a waiting list.   [facility] [address], [place]  [phone] [email or web address]   [facility] [address] [place], [place] [phone], Ext. [phone] Second and Fourth Thursday of each month, opens at 6:30 AM; Clinic ends at 9 AM.  Patients are seen on a first-come first-served basis, and a limited number are seen during each clinic.   [facility]  [address] Hillard Danker [place], [place] [phone]   Eligibility Requirements You must have lived in [place], [place], or [place] counties for at least the last three months.   You cannot be eligible for state or federal sponsored [employer], including [employer], [place], or [employer].   You generally cannot be eligible for healthcare insurance through your employer.    How to apply: Eligibility screenings are held every Tuesday and Wednesday afternoon from 1:00 pm until 4:00 pm. You  do not need an appointment for the interview!  [facility] [address] [place], [place]   [place]  Harmony Department  [place]  [phone]    Behavioral Health Resources in the Community: Intensive Outpatient Programs Organization         Address  Phone  Notes  [place]. [address] [place], [place] [phone]   [facility] Outpatient [address], [place], [place]   ADS: Alcohol & Drug Svcs [address], [place], [place]   [place] [address],  [place], [place] or [phone]   Substance Abuse Resources Organization         Address  Phone  Notes  Alcohol and Drug Services  [phone]   [place]  [phone]   [facility] [place]   Chinita Pester  [phone]   Residential & Outpatient Substance Abuse Program  [phone]   Psychological Services Organization         Address  Phone  Notes  [facility] [place]  [place]  [phone]   [place] [address] [place] or [phone]    Mobile Crisis Teams Organization         Address  Phone  Notes  [facility], Mobile Crisis Care Unit  [phone]   Assertive Psychotherapeutic  Services  [address] [place], [place]   [place] [address], [place] [phone]    Self-Help/Support Groups Organization         Address  Phone             Notes  [place]. of [place] - variety of support groups  [place] Call for more information  Narcotics Anonymous (NA), [facility] [address], [employer] [place]  2 meetings at this location   Special educational needs teacher         Address  Phone  Notes  [facility] [place],    [place]  [phone]   [facility]  [address] [place] [id number], [place], [place]   [place], [place] [phone] Admissions: 8am-3pm M-F  Incentives Substance [place] [address]    [place], [place] [phone]   [facility] [address] [place], [place], [place]   [facility] [address],  [place], [place]   [facility] Programs - Intensive Outpatient [address] Kristeen [address], [place], [place]   [facility] ([place].,  [place], [place] [phone] or [phone]   [facility] Services (RTS) [address], [place], [place] Accepts Medicaid  [facility] [place] [address],  [place] [phone] Substance Abuse/Addiction Treatment   [facility] Organization         Address  Phone  Notes  [facility]  [phone]   Domenic Schwab, PhD [address] Arlis Porta [place], [place]   [phone] or [phone]   [place], [place] [phone]   [place] [address] [place], [place] [phone] Insurance/Medicaid/sponsorship through [employer] and Families [address], [id number]  [place], [place] [phone] Therapy/tele-psych/case  °[facility] [address]  ° [place] [address], [place] [phone]    °Dr. Arfeen  [phone]   °[facility] [place]  [employer] [place] [facility] 1) [address], [place] °2) [address], [place] °3)  [address], [place] [phone] °([phone]) [phone] ° °([phone]) [phone]   °[place] Child Abuse Hotline (336) 342-1394 or (336) [phone] (After Hours)    ° ° ° °

## 2015-05-29 ENCOUNTER — Ambulatory Visit
Admission: RE | Admit: 2015-05-29 | Discharge: 2015-05-29 | Disposition: A | Discharge status: Home / Self Care | Payer: Medicaid Other | Source: Ambulatory Visit | Attending: Family Medicine | Admitting: Family Medicine

## 2015-05-29 ENCOUNTER — Other Ambulatory Visit: Payer: Self-pay | Admitting: Family Medicine

## 2015-05-29 DIAGNOSIS — J189 Pneumonia, unspecified organism: Secondary | ICD-10-CM

## 2015-06-26 ENCOUNTER — Encounter (HOSPITAL_COMMUNITY): Payer: Self-pay | Admitting: *Deleted

## 2015-06-26 ENCOUNTER — Emergency Department (HOSPITAL_COMMUNITY)
Admission: EM | Admit: 2015-06-26 | Discharge: 2015-06-26 | Disposition: A | Discharge status: Home / Self Care | Payer: Medicaid Other | Attending: Emergency Medicine | Admitting: Emergency Medicine

## 2015-06-26 DIAGNOSIS — Z87440 Personal history of urinary (tract) infections: Secondary | ICD-10-CM | POA: Diagnosis not present

## 2015-06-26 DIAGNOSIS — Z862 Personal history of diseases of the blood and blood-forming organs and certain disorders involving the immune mechanism: Secondary | ICD-10-CM | POA: Diagnosis not present

## 2015-06-26 DIAGNOSIS — F17210 Nicotine dependence, cigarettes, uncomplicated: Secondary | ICD-10-CM | POA: Insufficient documentation

## 2015-06-26 DIAGNOSIS — N946 Dysmenorrhea, unspecified: Secondary | ICD-10-CM | POA: Diagnosis not present

## 2015-06-26 DIAGNOSIS — Z79899 Other long term (current) drug therapy: Secondary | ICD-10-CM | POA: Insufficient documentation

## 2015-06-26 DIAGNOSIS — Z3202 Encounter for pregnancy test, result negative: Secondary | ICD-10-CM | POA: Diagnosis not present

## 2015-06-26 DIAGNOSIS — N939 Abnormal uterine and vaginal bleeding, unspecified: Secondary | ICD-10-CM | POA: Diagnosis present

## 2015-06-26 LAB — I-STAT CHEM 8, ED
BUN: 8 mg/dL (ref 6–20)
CALCIUM ION: 1.19 mmol/L (ref 1.12–1.23)
CHLORIDE: 102 mmol/L (ref 101–111)
Creatinine, Ser: 0.6 mg/dL (ref 0.44–1.00)
GLUCOSE: 73 mg/dL (ref 65–99)
HCT: 47 % — ABNORMAL HIGH (ref 36.0–46.0)
Hemoglobin: 16 g/dL — ABNORMAL HIGH (ref 12.0–15.0)
Potassium: 4.1 mmol/L (ref 3.5–5.1)
SODIUM: 140 mmol/L (ref 135–145)
TCO2: 25 mmol/L (ref 0–100)

## 2015-06-26 LAB — URINALYSIS, ROUTINE W REFLEX MICROSCOPIC
BILIRUBIN URINE: NEGATIVE
Glucose, UA: NEGATIVE mg/dL
KETONES UR: NEGATIVE mg/dL
Leukocytes, UA: NEGATIVE
NITRITE: NEGATIVE
PH: 6.5 (ref 5.0–8.0)
Protein, ur: NEGATIVE mg/dL
SPECIFIC GRAVITY, URINE: 1.025 (ref 1.005–1.030)

## 2015-06-26 LAB — CBC WITH DIFFERENTIAL/PLATELET
BASOS PCT: 0 %
Basophils Absolute: 0 10*3/uL (ref 0.0–0.1)
EOS ABS: 0.1 10*3/uL (ref 0.0–0.7)
Eosinophils Relative: 1 %
HEMATOCRIT: 40.6 % (ref 36.0–46.0)
HEMOGLOBIN: 14.2 g/dL (ref 12.0–15.0)
LYMPHS ABS: 2.6 10*3/uL (ref 0.7–4.0)
Lymphocytes Relative: 37 %
MCH: 30.9 pg (ref 26.0–34.0)
MCHC: 35 g/dL (ref 30.0–36.0)
MCV: 88.5 fL (ref 78.0–100.0)
MONO ABS: 0.2 10*3/uL (ref 0.1–1.0)
MONOS PCT: 3 %
Neutro Abs: 4.2 10*3/uL (ref 1.7–7.7)
Neutrophils Relative %: 59 %
Platelets: 312 10*3/uL (ref 150–400)
RBC: 4.59 MIL/uL (ref 3.87–5.11)
RDW: 13.9 % (ref 11.5–15.5)
WBC: 7.1 10*3/uL (ref 4.0–10.5)

## 2015-06-26 LAB — URINE MICROSCOPIC-ADD ON

## 2015-06-26 LAB — PREGNANCY, URINE: Preg Test, Ur: NEGATIVE

## 2015-06-26 LAB — I-STAT BETA HCG BLOOD, ED (MC, WL, AP ONLY)

## 2015-06-26 NOTE — Discharge Instructions (Signed)
As discussed, today's evaluation has been largely reassuring.  No ongoing bleeding is likely due to dysmenorrhea, or irregular menstrual periods.  With today's reassuring findings, however, it is important that you follow-up with our affiliated women's health facility for further evaluation and management.  Return here for concerning changes in your condition.

## 2015-06-26 NOTE — ED Notes (Signed)
Pt states she has had light vaginal bleeding for the past 3 months. Pt states she has her normal period at the beginning of the month, which she states has been accompanied by painful cramping. Pt had pregnancy 9 months ago. Pt states she had a depot shot in April after the pregnancy, but has not had a shot since. Pt denies any other discharge/odor.

## 2015-06-26 NOTE — ED Provider Notes (Signed)
CSN: [id number]     Arrival date & time 06/26/15  1132 History   First MD Initiated Contact with Patient 06/26/15 1227     Chief Complaint  Patient presents with  . Vaginal Bleeding    HPI  Patient presents with concern of intermittent vaginal bleeding. Symptoms have been present for about 3 months. Patient presents today because she continues to have occasional vaginal bleeding, but no change in the characteristics, consistency, frequency. No associated chest pain, lightheadedness, syncope, dyspnea, some central abdominal pain. She thinks her last menstrual period was earlier this month. [facility] 9 months ago. She has not seen any practitioners since the onset of her unusual bleeding 3 months ago.   Past Medical History  Diagnosis Date  . No pertinent past medical history   . Infection     UTI  . Sickle cell trait [facility])    Past Surgical History  Procedure Laterality Date  . Dilation and curettage of uterus     Family History  Problem Relation Age of Onset  . Anesthesia problems Neg Hx   . Hypotension Neg Hx   . Malignant hyperthermia Neg Hx   . Pseudochol deficiency Neg Hx   . Asthma Mother   . Multiple sclerosis Sister   . Cancer Maternal Grandmother   . Cancer Paternal Grandmother    Social History  Substance Use Topics  . Smoking status: Current Every Day Smoker -- 0.50 packs/day    Types: Cigarettes  . Smokeless tobacco: Never Used     Comment: unable to smoke last 2 wks  . Alcohol Use: No     Comment: occ   OB History    Gravida Para Term Preterm AB TAB SAB Ectopic Multiple Living   5 4 4  0 1 1 0 0 0 4     Review of Systems  Constitutional:       Per HPI, otherwise negative  HENT:       Per HPI, otherwise negative  Respiratory:       Per HPI, otherwise negative  Cardiovascular:       Per HPI, otherwise negative  Gastrointestinal: Negative for vomiting.  Endocrine:       Negative aside from HPI  Genitourinary:       Neg aside from HPI    Musculoskeletal:       Per HPI, otherwise negative  Skin: Negative for pallor.  Neurological: Negative for syncope and weakness.      Allergies  Review of patient's allergies indicates no known allergies.  Home Medications   Prior to Admission medications   Medication Sig Start Date End Date Taking? Authorizing Provider  buPROPion (WELLBUTRIN XL) 150 MG 24 hr tablet Take 150 mg by mouth daily. 05/29/15  Yes Historical Provider, MD  azithromycin (ZITHROMAX) 250 MG tablet Take 1 tablet (250 mg total) by mouth daily. Take first 2 tablets together, then 1 every day until finished. Patient not taking: Reported on 06/26/2015 05/11/15   Marella Chimes, PA-C  benzonatate (TESSALON) 100 MG capsule Take 1 capsule (100 mg total) by mouth every 8 (eight) hours. Patient not taking: Reported on 06/26/2015 05/11/15   Marella Chimes, PA-C   BP 99/62 mmHg  Pulse 68  Temp(Src) 98.1 F (36.7 C) (Oral)  Resp 20  SpO2 100%  LMP 06/09/2015 Physical Exam  Constitutional: She is oriented to person, place, and time. She appears well-developed and well-nourished. No distress.  HENT:  Head: Normocephalic and atraumatic.  Eyes: Conjunctivae and  EOM are normal.  Cardiovascular: Normal rate and regular rhythm.   Pulmonary/Chest: Effort normal and breath sounds normal. No stridor. No respiratory distress.  Abdominal: She exhibits no distension. There is no tenderness.  Musculoskeletal: She exhibits no edema.  Neurological: She is alert and oriented to person, place, and time. No cranial nerve deficit.  Skin: Skin is warm and dry.  Psychiatric: She has a normal mood and affect.  Nursing note and vitals reviewed.   ED Course  Procedures (including critical care time) Labs Review Labs Reviewed  URINALYSIS, ROUTINE W REFLEX MICROSCOPIC (NOT AT [facility]) - Abnormal; Notable for the following:    Hgb urine dipstick LARGE (*)    All other components within normal limits  URINE MICROSCOPIC-ADD ON -  Abnormal; Notable for the following:    Squamous Epithelial / LPF 0-5 (*)    Bacteria, UA RARE (*)    All other components within normal limits  I-STAT CHEM 8, ED - Abnormal; Notable for the following:    Hemoglobin 16.0 (*)    HCT 47.0 (*)    All other components within normal limits  CBC WITH DIFFERENTIAL/PLATELET  PREGNANCY, URINE  I-STAT BETA HCG BLOOD, ED ([facility], [facility], [facility] ONLY)    On repeat exam the patient is in no distress.  Patient will follow up with [facility].  MDM   Final diagnoses:  Dysmenorrhea   Patient presents with 3 months of intermittent vaginal bleeding. Here the patient is awake, alert, afebrile, in no distress, with soft, non-peritoneal abdomen. Given the chronicity of her complaint, the absence of distress, the suspicion for acute new pathology. However, the patient they require additional evaluation, best conducted at our affiliated women's health facility, which the patient was referred.   Carmin Muskrat, MD 06/26/15 [phone]

## 2015-06-26 NOTE — Progress Notes (Signed)
Entered in d/c instructions McKenzie, Gwynneth Munson Schedule an appointment as soon as possible for a visit This is your assigned Medicaid [place] access doctor If you prefer another contact [place] assigned your doctor *You may receive a bill if you go to any family Dr not assigned to you [place] [address] [phone]  Medicaid [place] Access Covered Patient [place]: [place], [place] [address] [email or web address] USE THIS [place] As a Medicaid client you MUST contact DSS/SSI each time you change address, move to another [place] or another state to keep your address updated [place] Medicaid Transportation to Dr appts if you are have full Medicaid: [phone], [phone]

## 2015-06-28 ENCOUNTER — Inpatient Hospital Stay (HOSPITAL_COMMUNITY)
Admission: AD | Admit: 2015-06-28 | Discharge: 2015-06-28 | Disposition: A | Discharge status: Home / Self Care | Payer: Medicaid Other | Source: Ambulatory Visit | Attending: Obstetrics | Admitting: Obstetrics

## 2015-06-28 DIAGNOSIS — F17210 Nicotine dependence, cigarettes, uncomplicated: Secondary | ICD-10-CM | POA: Diagnosis not present

## 2015-06-28 DIAGNOSIS — N921 Excessive and frequent menstruation with irregular cycle: Secondary | ICD-10-CM | POA: Diagnosis not present

## 2015-06-28 DIAGNOSIS — D573 Sickle-cell trait: Secondary | ICD-10-CM | POA: Diagnosis not present

## 2015-06-28 DIAGNOSIS — N939 Abnormal uterine and vaginal bleeding, unspecified: Secondary | ICD-10-CM | POA: Diagnosis not present

## 2015-06-28 DIAGNOSIS — N938 Other specified abnormal uterine and vaginal bleeding: Secondary | ICD-10-CM | POA: Diagnosis present

## 2015-06-28 LAB — CBC WITH DIFFERENTIAL/PLATELET
BASOS PCT: 0 %
Basophils Absolute: 0 10*3/uL (ref 0.0–0.1)
EOS ABS: 0.1 10*3/uL (ref 0.0–0.7)
EOS PCT: 1 %
HCT: 36.3 % (ref 36.0–46.0)
HEMOGLOBIN: 13.2 g/dL (ref 12.0–15.0)
Lymphocytes Relative: 41 %
Lymphs Abs: 2.8 10*3/uL (ref 0.7–4.0)
MCH: 31.4 pg (ref 26.0–34.0)
MCHC: 36.4 g/dL — AB (ref 30.0–36.0)
MCV: 86.2 fL (ref 78.0–100.0)
MONOS PCT: 4 %
Monocytes Absolute: 0.3 10*3/uL (ref 0.1–1.0)
NEUTROS PCT: 54 %
Neutro Abs: 3.6 10*3/uL (ref 1.7–7.7)
PLATELETS: 294 10*3/uL (ref 150–400)
RBC: 4.21 MIL/uL (ref 3.87–5.11)
RDW: 13.9 % (ref 11.5–15.5)
WBC: 6.7 10*3/uL (ref 4.0–10.5)

## 2015-06-28 LAB — POCT PREGNANCY, URINE: PREG TEST UR: NEGATIVE

## 2015-06-28 MED ORDER — MEGESTROL ACETATE 20 MG PO TABS: 40.0000 mg | ORAL_TABLET | Freq: Two times a day (BID) | ORAL | Status: DC

## 2015-06-28 NOTE — MAU Provider Note (Signed)
History     CSN: [id number]  Arrival date and time: 06/28/15 1734   First Provider Initiated Contact with Patient 06/28/15 1817      Chief Complaint  Patient presents with  . Vaginal Bleeding   HPI [name] is a 35 y.o. [id number] who presents to MAU today with complaint of prolonged vaginal bleeding. The patient states that she has a "normal period" at the beginning of the month and for the last 3 months at least she has noted spotting daily in between her periods. She states that she had a normal pap smear and negative infection testing done within the last 6 months. She is not on birth control currently. Most recently she received one Depo Injection prior to discharge from [facility] after the birth of her last child 9 months ago. She denies abdominal pain. She does endorse occasional fatigue and dizziness. She denies weakness. She is not wearing a pad today, but continues to see spotting with wiping.   OB History    Gravida Para Term Preterm AB TAB SAB Ectopic Multiple Living   5 4 4  0 1 1 0 0 0 4      Past Medical History  Diagnosis Date  . No pertinent past medical history   . Infection     UTI  . Sickle cell trait [facility])     Past Surgical History  Procedure Laterality Date  . Dilation and curettage of uterus      Family History  Problem Relation Age of Onset  . Anesthesia problems Neg Hx   . Hypotension Neg Hx   . Malignant hyperthermia Neg Hx   . Pseudochol deficiency Neg Hx   . Asthma Mother   . Multiple sclerosis Sister   . Cancer Maternal Grandmother   . Cancer Paternal Grandmother     Social History  Substance Use Topics  . Smoking status: Current Every Day Smoker -- 0.50 packs/day    Types: Cigarettes  . Smokeless tobacco: Never Used     Comment: unable to smoke last 2 wks  . Alcohol Use: No     Comment: occ    Allergies: No Known Allergies  Prescriptions prior to admission  Medication Sig Dispense Refill Last Dose  . azithromycin (ZITHROMAX) 250  MG tablet Take 1 tablet (250 mg total) by mouth daily. Take first 2 tablets together, then 1 every day until finished. (Patient not taking: Reported on 06/26/2015) 6 tablet 0 Completed Course at Unknown time  . benzonatate (TESSALON) 100 MG capsule Take 1 capsule (100 mg total) by mouth every 8 (eight) hours. (Patient not taking: Reported on 06/26/2015) 21 capsule 0 Completed Course at Unknown time  . buPROPion (WELLBUTRIN XL) 150 MG 24 hr tablet Take 150 mg by mouth daily.  3 a week ago    Review of Systems  Constitutional: Positive for malaise/fatigue. Negative for fever.  Gastrointestinal: Negative for nausea, vomiting, abdominal pain, diarrhea and constipation.  Genitourinary:       + spotting  Neurological: Positive for dizziness. Negative for loss of consciousness and weakness.   Physical Exam   Blood pressure 122/86, pulse 82, temperature 98.2 F (36.8 C), resp. rate 18, last menstrual period 06/09/2015, unknown if currently breastfeeding.  Physical Exam  Nursing note and vitals reviewed. Constitutional: She is oriented to person, place, and time. She appears well-developed and well-nourished. No distress.  HENT:  Head: Normocephalic and atraumatic.  Cardiovascular: Normal rate.   Respiratory: Effort normal.  GI: Soft. She exhibits  no distension.  Genitourinary:  Pelvic exam deferred as patient is barely spotting today  Neurological: She is alert and oriented to person, place, and time.  Skin: Skin is warm and dry. No erythema.  Psychiatric: She has a normal mood and affect.   Results for orders placed or performed during the hospital encounter of 06/28/15 (from the past 24 hour(s))  CBC with Differential/Platelet     Status: Abnormal   Collection Time: 06/28/15  5:54 PM  Result Value Ref Range   WBC 6.7 4.0 - 10.5 K/uL   RBC 4.21 3.87 - 5.11 MIL/uL   Hemoglobin 13.2 12.0 - 15.0 g/dL   HCT 36.3 36.0 - 46.0 %   MCV 86.2 78.0 - 100.0 fL   MCH 31.4 26.0 - 34.0 pg   MCHC  36.4 (H) 30.0 - 36.0 g/dL   RDW 13.9 11.5 - 15.5 %   Platelets 294 150 - 400 K/uL   Neutrophils Relative % 54 %   Neutro Abs 3.6 1.7 - 7.7 K/uL   Lymphocytes Relative 41 %   Lymphs Abs 2.8 0.7 - 4.0 K/uL   Monocytes Relative 4 %   Monocytes Absolute 0.3 0.1 - 1.0 K/uL   Eosinophils Relative 1 %   Eosinophils Absolute 0.1 0.0 - 0.7 K/uL   Basophils Relative 0 %   Basophils Absolute 0.0 0.0 - 0.1 K/uL  Pregnancy, urine POC     Status: None   Collection Time: 06/28/15  6:15 PM  Result Value Ref Range   Preg Test, Ur NEGATIVE NEGATIVE    MAU Course  Procedures None  MDM UPT - negative CBC today Patient is hemodynamically stable at time of discharge  Assessment and Plan  A: Abnormal uterine bleeding  P: Discharge home Rx for Megace given to patient Bleeding precautions discussed Patient advised to follow-up with Dr. Ruthann Cancer in the next 2-3 weeks for further evaluation of irregular bleeding Patient may return to MAU as needed or if her condition were to change or worsen  Luvenia Redden, PA-C  06/28/2015, 6:17 PM

## 2015-06-28 NOTE — MAU Note (Signed)
Pt presents to MAU with complaints of vaginal bleeding for 3 months. Pt states she went to [facility] on the 18th and they didn't do anything for her. PT denies any pain.

## 2015-06-28 NOTE — Discharge Instructions (Signed)
Abnormal Uterine Bleeding °Abnormal uterine bleeding means bleeding from the vagina that is not your normal menstrual period. This can be: °· Bleeding or spotting between periods. °· Bleeding after sex (sexual intercourse). °· Bleeding that is heavier or more than normal. °· Periods that last longer than usual. °· Bleeding after menopause. °There are many problems that may cause this. Treatment will depend on the cause of the bleeding. Any kind of bleeding that is not normal should be reviewed by your doctor.  °HOME CARE °Watch your condition for any changes. These actions may lessen any discomfort you are having: °· Do not use tampons or douches as told by your doctor. °· Change your pads often. °You should get regular pelvic exams and Pap tests. Keep all appointments for tests as told by your doctor. °GET HELP IF: °· You are bleeding for more than 1 week. °· You feel dizzy at times. °GET HELP RIGHT AWAY IF:  °· You pass out. °· You have to change pads every 15 to 30 minutes. °· You have belly pain. °· You have a fever. °· You become sweaty or weak. °· You are passing large blood clots from the vagina. °· You feel sick to your stomach (nauseous) and throw up (vomit). °MAKE SURE YOU: °· Understand these instructions. °· Will watch your condition. °· Will get help right away if you are not doing well or get worse. °  °This information is not intended to replace advice given to you by your health care provider. Make sure you discuss any questions you have with your health care provider. °  °Document Released: 03/22/2009 Document Revised: 05/30/2013 Document Reviewed: 12/22/2012 °Elsevier Interactive Patient Education ©2016 Elsevier Inc[employer] ° °

## 2015-07-19 ENCOUNTER — Encounter: Payer: Medicaid Other | Admitting: Obstetrics & Gynecology

## 2015-11-25 IMAGING — US US OB COMP LESS 14 WK
1 series · 14 of 18 positions shown · non-contrast
Comparison: None.

CLINICAL DATA: Pelvic pain.  Unsure of LMP.

EXAM:
OBSTETRIC <14 WK ULTRASOUND
TECHNIQUE: Transabdominal ultrasound was performed for evaluation of the
gestation as well as the maternal uterus and adnexal regions.

[Series 1: us ob comp less 14 wks · 14 of 18 slices shown]
[im 1/18]
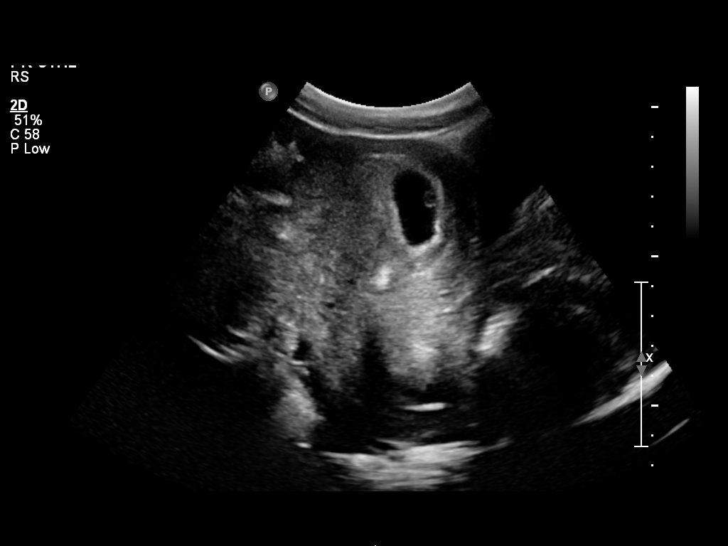
[im 2/18]
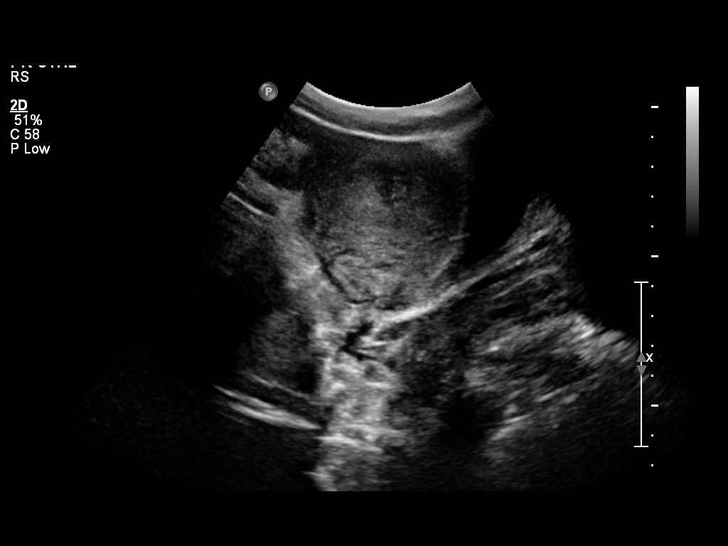
[im 4/18]
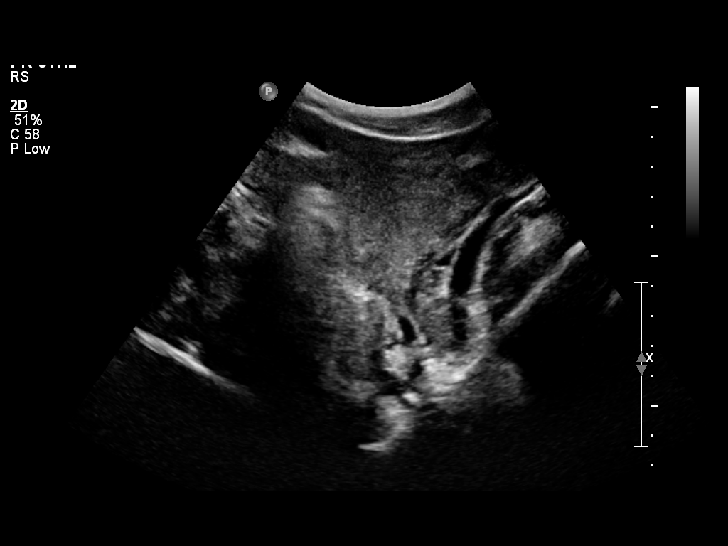
[im 5/18]
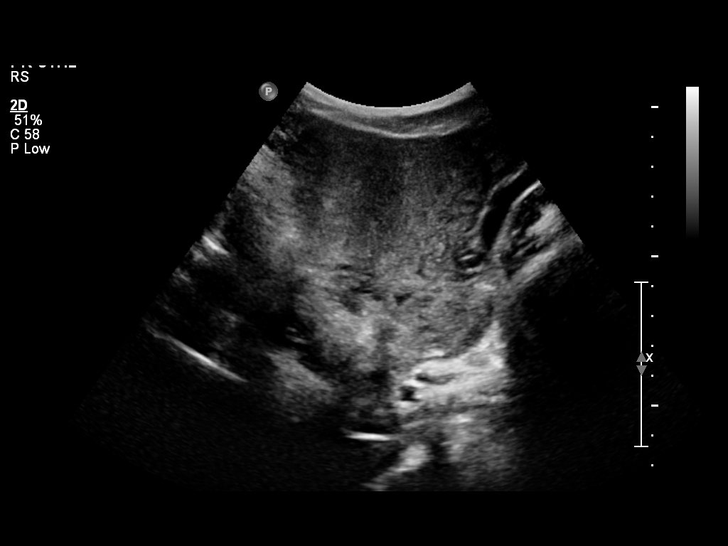
[im 6/18]
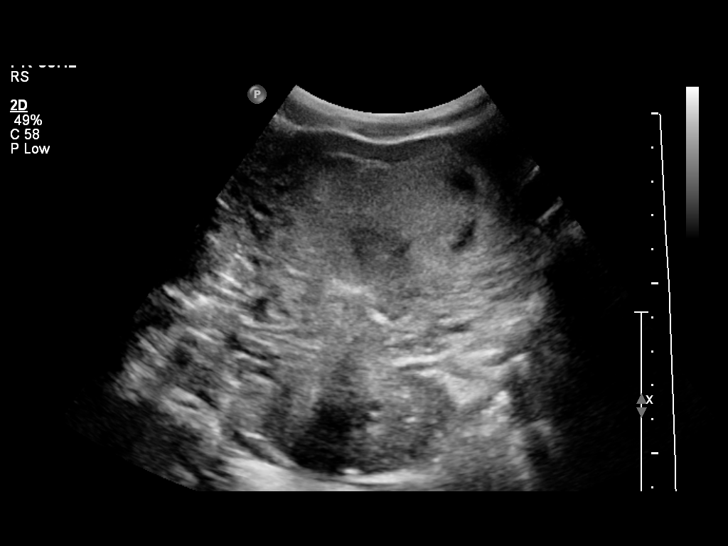
[im 8/18]
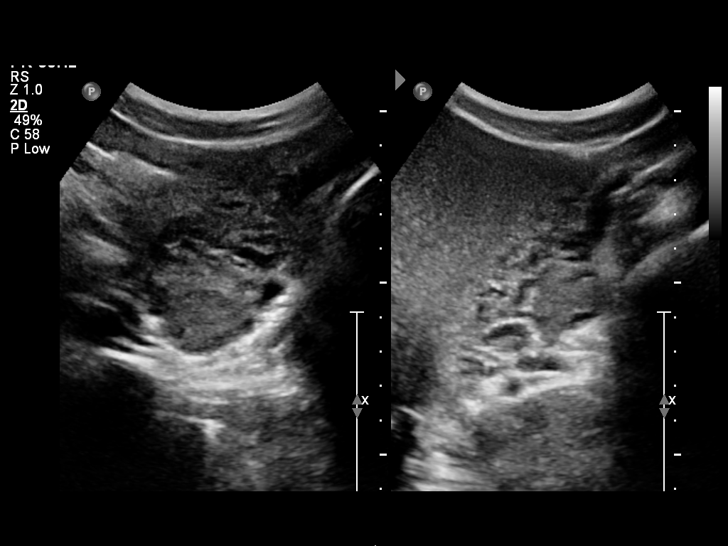
[im 9/18]
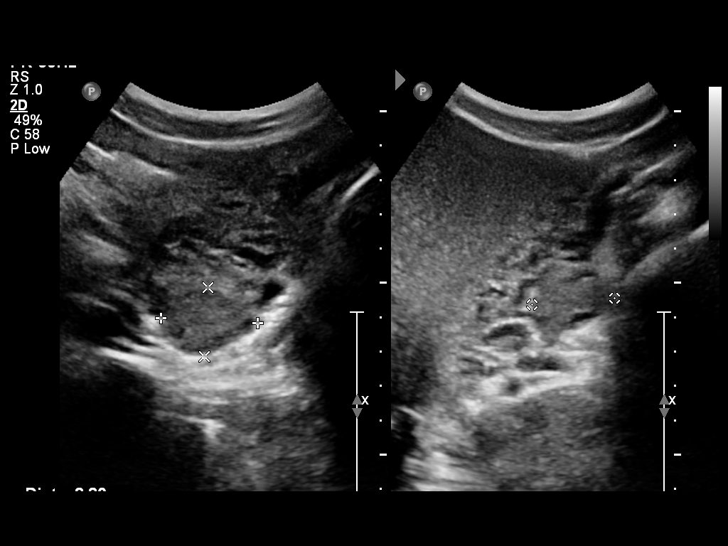
[im 10/18]
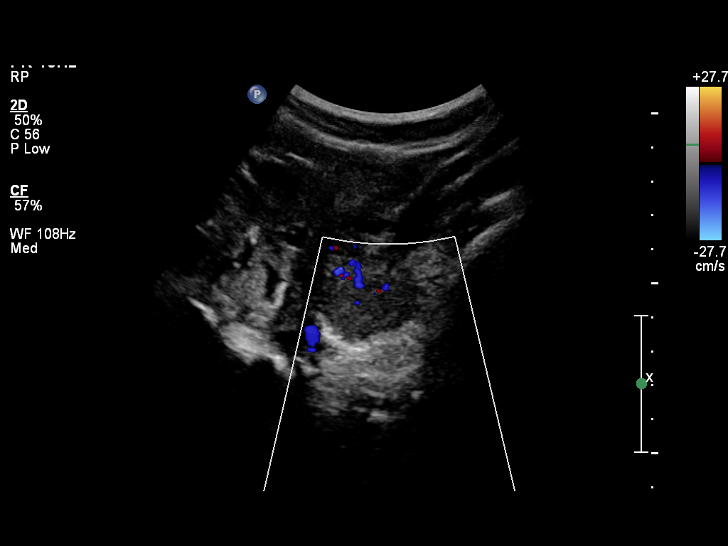
[im 11/18]
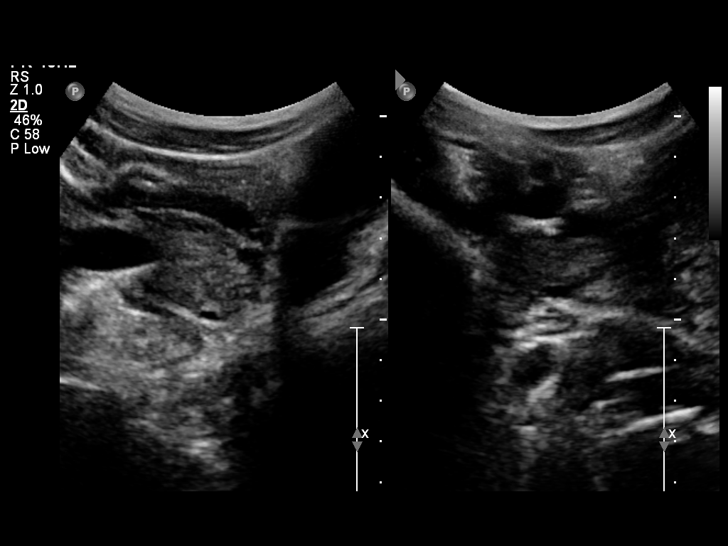
[im 13/18]
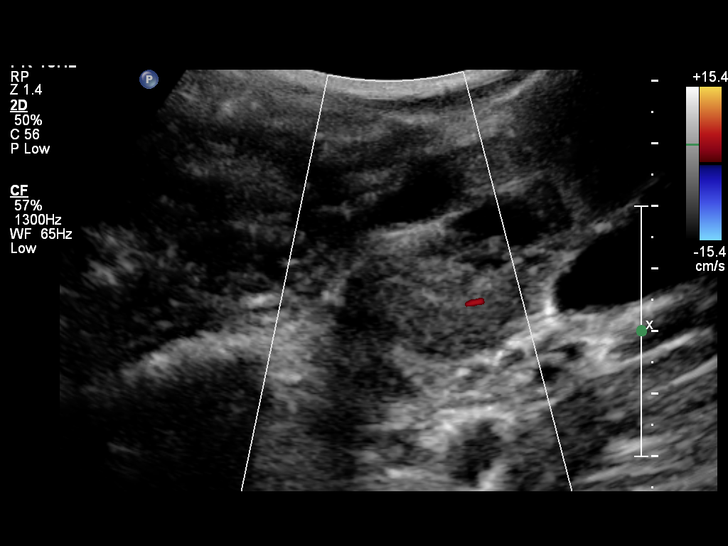
[im 14/18]
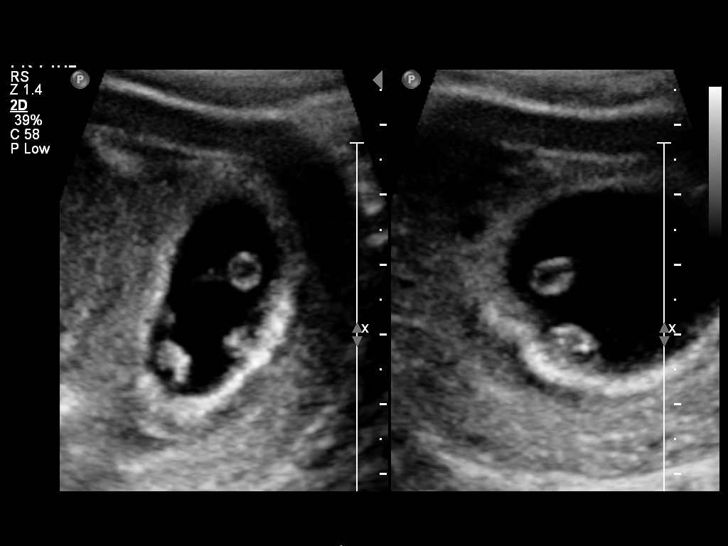
[im 15/18]
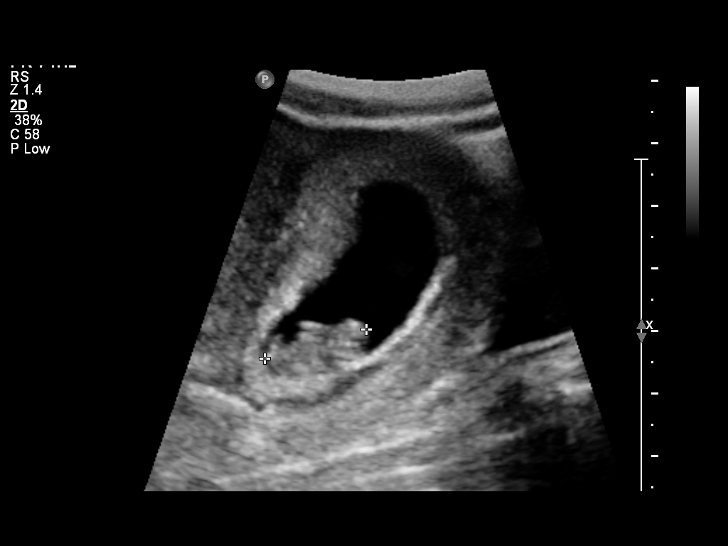
[im 17/18]
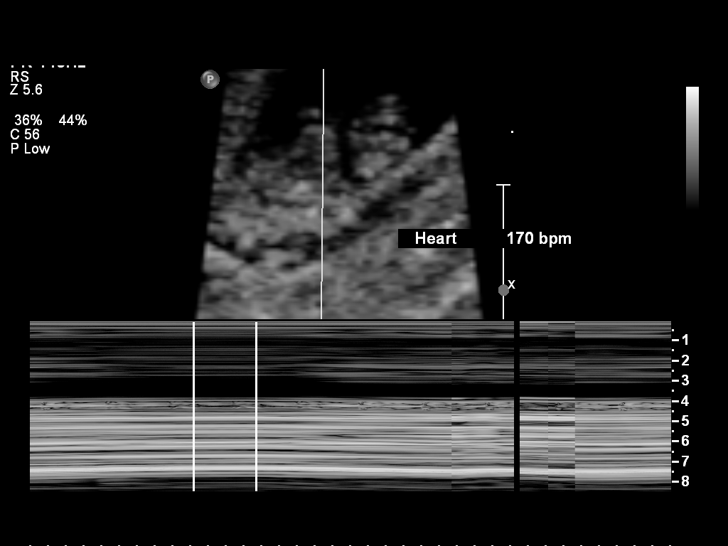
[im 18/18]
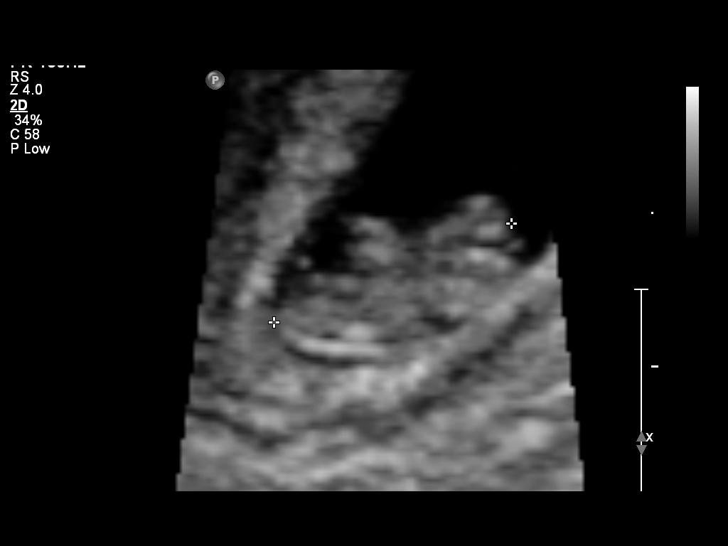

[14 of 18 positions shown; findings below may reference images not displayed]

FINDINGS: Intrauterine gestational sac: Visualized/normal in shape.

Yolk sac:  Visualized

Embryo:  Visualized

Cardiac Activity: Visualized

Heart Rate: 170 bpm

CRL:   17  mm   8 w 1 d                  US EDC: 09/23/2014

Maternal uterus/adnexae: Both ovaries are normal in appearance. No
mass or free fluid identified.
IMPRESSION: Single living IUP measuring 8 weeks 1 day with US EDC of 09/23/2014.

No significant maternal uterine or adnexal abnormality identified.

## 2015-12-17 ENCOUNTER — Inpatient Hospital Stay (HOSPITAL_COMMUNITY)
Admission: AD | Admit: 2015-12-17 | Discharge: 2015-12-17 | Disposition: A | Discharge status: Home / Self Care | Payer: 59 | Source: Ambulatory Visit | Attending: Emergency Medicine | Admitting: Emergency Medicine

## 2015-12-17 ENCOUNTER — Encounter (HOSPITAL_COMMUNITY): Payer: Self-pay

## 2015-12-17 DIAGNOSIS — D573 Sickle-cell trait: Secondary | ICD-10-CM | POA: Insufficient documentation

## 2015-12-17 DIAGNOSIS — K429 Umbilical hernia without obstruction or gangrene: Secondary | ICD-10-CM | POA: Insufficient documentation

## 2015-12-17 DIAGNOSIS — R110 Nausea: Secondary | ICD-10-CM | POA: Insufficient documentation

## 2015-12-17 DIAGNOSIS — Z3202 Encounter for pregnancy test, result negative: Secondary | ICD-10-CM | POA: Insufficient documentation

## 2015-12-17 DIAGNOSIS — R109 Unspecified abdominal pain: Secondary | ICD-10-CM | POA: Diagnosis present

## 2015-12-17 DIAGNOSIS — F17210 Nicotine dependence, cigarettes, uncomplicated: Secondary | ICD-10-CM | POA: Insufficient documentation

## 2015-12-17 DIAGNOSIS — Z87440 Personal history of urinary (tract) infections: Secondary | ICD-10-CM | POA: Insufficient documentation

## 2015-12-17 DIAGNOSIS — K439 Ventral hernia without obstruction or gangrene: Secondary | ICD-10-CM

## 2015-12-17 LAB — URINALYSIS, ROUTINE W REFLEX MICROSCOPIC
BILIRUBIN URINE: NEGATIVE
Glucose, UA: NEGATIVE mg/dL
KETONES UR: NEGATIVE mg/dL
Leukocytes, UA: NEGATIVE
Nitrite: NEGATIVE
PROTEIN: NEGATIVE mg/dL
SPECIFIC GRAVITY, URINE: 1.015 (ref 1.005–1.030)
pH: 6.5 (ref 5.0–8.0)

## 2015-12-17 LAB — POCT PREGNANCY, URINE
PREG TEST UR: NEGATIVE
PREG TEST UR: NEGATIVE

## 2015-12-17 LAB — URINE MICROSCOPIC-ADD ON
Bacteria, UA: NONE SEEN
WBC, UA: NONE SEEN WBC/hpf (ref 0–5)

## 2015-12-17 MED ORDER — IBUPROFEN 800 MG PO TABS: 800.0000 mg | ORAL_TABLET | Freq: Three times a day (TID) | ORAL | Status: DC

## 2015-12-17 MED ORDER — ONDANSETRON 4 MG PO TBDP: 4.0000 mg | ORAL_TABLET | Freq: Three times a day (TID) | ORAL | Status: DC | PRN

## 2015-12-17 MED ORDER — HYDROCODONE-ACETAMINOPHEN 5-325 MG PO TABS: 1.0000 | ORAL_TABLET | ORAL | Status: DC | PRN

## 2015-12-17 NOTE — MAU Note (Signed)
Was at work, started having pain, thought she might have a hernia.  Protrusion noted to right of umbilicus.  Is worse today, and harder. Feels like contractions.  First noted about 2-3 yrs ago.

## 2015-12-17 NOTE — Consult Note (Signed)
Reason for Consult: Umbilical hernia Referring Physician:  Debroah Baller NP  Veronica Green is an 35 y.o. female.  HPI: pt presented to the Ed at [facility] and complained of her hernia protruding and being painful.  She has had this hernia for 2-3 years.She report the hernia came out and got hard and very painful.  They were able to reduce the hernia at [facility], but it tends to reoccur and she is having some ongoing pain with it..  She says the pain was 7/10.  It sounds like it is always palpable and she has some intermittent nausea with this.  She was very anxious and was referred to the [facility] and our service. Work up at [employer] shows she is afebrile, VSS, UA is normal, she is not pregnant.  No films.   Past Medical History  Diagnosis Date  . No pertinent past medical history   . Infection     UTI  . Sickle cell trait [facility])     Past Surgical History  Procedure Laterality Date  . Dilation and curettage of uterus      Family History  Problem Relation Age of Onset  . Anesthesia problems Neg Hx   . Hypotension Neg Hx   . Malignant hyperthermia Neg Hx   . Pseudochol deficiency Neg Hx   . Asthma Mother   . Multiple sclerosis Sister   . Cancer Maternal Grandmother   . Cancer Paternal Grandmother     Social History:  reports that she has been smoking Cigarettes.  She has been smoking about 0.50 packs per day. She has never used smokeless tobacco. She reports that she does not drink alcohol or use illicit drugs. Tobacco :  1PPD for 13 years ETOH:  None DRUGS:  None  Single with 4 children -   Allergies: No Known Allergies  Medications: Prior to Admission:  Prior to Admission medications   Medication Sig Start Date End Date Taking? Authorizing Provider  ibuprofen (ADVIL,MOTRIN) 200 MG tablet Take 400 mg by mouth every 6 (six) hours as needed for headache.   Yes Historical Provider, MD  megestrol (MEGACE) 20 MG tablet Take 2 tablets (40 mg total) by mouth 2 (two) times daily. Patient not  taking: Reported on 12/17/2015 06/28/15   Luvenia Redden, PA-C     Results for orders placed or performed during the hospital encounter of 12/17/15 (from the past 48 hour(s))  Urinalysis, Routine w reflex microscopic (not at [facility])     Status: Abnormal   Collection Time: 12/17/15 12:35 PM  Result Value Ref Range   Color, Urine YELLOW YELLOW   APPearance CLEAR CLEAR   Specific Gravity, Urine 1.015 1.005 - 1.030   pH 6.5 5.0 - 8.0   Glucose, UA NEGATIVE NEGATIVE mg/dL   Hgb urine dipstick SMALL (A) NEGATIVE   Bilirubin Urine NEGATIVE NEGATIVE   Ketones, ur NEGATIVE NEGATIVE mg/dL   Protein, ur NEGATIVE NEGATIVE mg/dL   Nitrite NEGATIVE NEGATIVE   Leukocytes, UA NEGATIVE NEGATIVE  Urine microscopic-add on     Status: Abnormal   Collection Time: 12/17/15 12:35 PM  Result Value Ref Range   Squamous Epithelial / LPF 0-5 (A) NONE SEEN   WBC, UA NONE SEEN 0 - 5 WBC/hpf   RBC / HPF 0-5 0 - 5 RBC/hpf   Bacteria, UA NONE SEEN NONE SEEN  Pregnancy, urine POC     Status: None   Collection Time: 12/17/15 12:42 PM  Result Value Ref Range   Preg Test, Ur  NEGATIVE NEGATIVE    Comment:        THE SENSITIVITY OF THIS METHODOLOGY IS >24 mIU/mL   Pregnancy, urine POC     Status: None   Collection Time: 12/17/15 12:53 PM  Result Value Ref Range   Preg Test, Ur NEGATIVE NEGATIVE    Comment:        THE SENSITIVITY OF THIS METHODOLOGY IS >24 mIU/mL     No results found.  Review of Systems  Constitutional: Negative.   HENT: Negative.   Eyes: Negative.   Respiratory: Negative.   Cardiovascular: Negative.   Gastrointestinal: Positive for nausea and abdominal pain. Negative for vomiting, diarrhea, constipation, blood in stool and melena.  Genitourinary: Negative.   Musculoskeletal: Negative.   Skin: Negative.   Neurological: Negative.   Endo/Heme/Allergies: Negative.   Psychiatric/Behavioral: Negative.    Blood pressure 105/68, pulse 63, temperature 98.3 F (36.8 C), temperature source  Oral, resp. rate 16, weight 44.09 kg (97 lb 3.2 oz), last menstrual period 12/12/2015, SpO2 100 %, unknown if currently breastfeeding. Physical Exam  Constitutional: She is oriented to person, place, and time. She appears well-developed and well-nourished. No distress.  HENT:  Head: Normocephalic and atraumatic.  Nose: Nose normal.  Eyes: Right eye exhibits no discharge. Left eye exhibits no discharge.  Neck: Normal range of motion. Neck supple. No JVD present. No tracheal deviation present. No thyromegaly present.  Cardiovascular: Normal rate, regular rhythm, normal heart sounds and intact distal pulses.   No murmur heard. Respiratory: Effort normal and breath sounds normal. No respiratory distress. She has no wheezes. She has no rales. She exhibits no tenderness.  GI: Soft. Bowel sounds are normal. She exhibits no distension and no mass. There is tenderness. There is no rebound and no guarding.  She has a 2 cm umbilical hernia, that is reduced but still tender.  No nausea or vomiting with current sx.  She had some nausea earlier in the week with this.  Musculoskeletal: She exhibits no edema.  Lymphadenopathy:    She has no cervical adenopathy.  Neurological: She is alert and oriented to person, place, and time.  Skin: Skin is warm and dry. No rash noted. She is not diaphoretic. No erythema. No pallor.  Psychiatric: She has a normal mood and affect. Her behavior is normal. Judgment and thought content normal.    Assessment/Plan: Umbilical hernia with intermittent obstruction.   Tobacco use  Plan:  She is not incarcerated, no nausea or vomiting.  We are going to let her go home and follow up with one of our doctors next week.  Dr. Ninfa Linden 03/24/16 at 9:50Am, be at the office 30 minutes early for check in.      [name] 12/17/2015, 4:34 PM

## 2015-12-17 NOTE — ED Notes (Signed)
[facility]. Pt states she has had an umbilical hernia for years and today pain increased.

## 2015-12-17 NOTE — ED Notes (Signed)
Surgical PA at bedside  

## 2015-12-17 NOTE — Progress Notes (Signed)
[facility] here to transfer patient to [facility].  Pt in stable condition.  V/S WNL.

## 2015-12-17 NOTE — MAU Provider Note (Signed)
CSN: [id number]     Arrival date & time 12/17/15  1216 History   None    Chief Complaint  Patient presents with  . Abdominal Pain     (Consider location/radiation/quality/duration/timing/severity/associated sxs/prior Treatment) Abdominal Pain This is a new problem. The current episode started today. The onset quality is sudden. The problem occurs constantly. The most recent episode lasted 1 hour. The pain is at a severity of 10/10. The pain is severe. The quality of the pain is sharp. The abdominal pain does not radiate. Associated symptoms include nausea. The pain is relieved by nothing. She has tried nothing for the symptoms. hernia   Veronica Green is a 35 y.o. [phone] with hx of abdominal hernia x 3 years without problems presents to the MAU with abdominal pain that started while at work and is severe. The hernia came out and got really hard and the pain won't stop. She has had constipation recently and has had some straining.   Past Medical History  Diagnosis Date  . No pertinent past medical history   . Infection     UTI  . Sickle cell trait [facility])    Past Surgical History  Procedure Laterality Date  . Dilation and curettage of uterus     Family History  Problem Relation Age of Onset  . Anesthesia problems Neg Hx   . Hypotension Neg Hx   . Malignant hyperthermia Neg Hx   . Pseudochol deficiency Neg Hx   . Asthma Mother   . Multiple sclerosis Sister   . Cancer Maternal Grandmother   . Cancer Paternal Grandmother    Social History  Substance Use Topics  . Smoking status: Current Every Day Smoker -- 0.50 packs/day    Types: Cigarettes  . Smokeless tobacco: Never Used     Comment: unable to smoke last 2 wks  . Alcohol Use: No     Comment: occ   OB History    Gravida Para Term Preterm AB TAB SAB Ectopic Multiple Living   5 4 4  0 1 1 0 0 0 4     Review of Systems  Gastrointestinal: Positive for nausea and abdominal pain.  all other systems negative    Allergies    Review of patient's allergies indicates no known allergies.  Home Medications   Prior to Admission medications   Medication Sig Start Date End Date Taking? Authorizing Provider  ibuprofen (ADVIL,MOTRIN) 200 MG tablet Take 400 mg by mouth every 6 (six) hours as needed for headache.   Yes Historical Provider, MD  megestrol (MEGACE) 20 MG tablet Take 2 tablets (40 mg total) by mouth 2 (two) times daily. Patient not taking: Reported on 12/17/2015 06/28/15   Luvenia Redden, PA-C   BP 99/66 mmHg  Pulse 71  Temp(Src) 97.9 F (36.6 C) (Oral)  Resp 18  Wt 97 lb 3.2 oz (44.09 kg)  LMP 12/12/2015 Physical Exam  Constitutional: She is oriented to person, place, and time. She appears well-developed and well-nourished.  HENT:  Head: Normocephalic.  Eyes: EOM are normal.  Neck: Neck supple.  Cardiovascular: Normal rate.   Pulmonary/Chest: Effort normal.  Abdominal: Soft. Bowel sounds are normal. There is tenderness in the periumbilical area. There is guarding. There is no rebound. A hernia is present.    Musculoskeletal: Normal range of motion.  Neurological: She is alert and oriented to person, place, and time. No cranial nerve deficit.  Skin: Skin is warm and dry.  Psychiatric: She has a normal mood and  affect. Her behavior is normal.  Nursing note and vitals reviewed.   ED Course  Procedures (including critical care time) Labs Review Labs Reviewed  URINALYSIS, ROUTINE W REFLEX MICROSCOPIC (NOT AT [facility]) - Abnormal; Notable for the following:    Hgb urine dipstick SMALL (*)    All other components within normal limits  URINE MICROSCOPIC-ADD ON - Abnormal; Notable for the following:    Squamous Epithelial / LPF 0-5 (*)    All other components within normal limits  POCT PREGNANCY, URINE  POCT PREGNANCY, URINE   1310 Spoke with Dr. Audie Pinto, [place] call to general surgery for consult  1345 Surgeon returned call and will evaluate patient at [facility]. Will transfer patient   Imaging  Review  MDM  35 y.o. female with abdominal pain due to hernia stable for transfer, after hernia reduced, to [facility] for evaluation by general surgery. Discussed with the patient and all questioned fully answered. She agrees with plan.    Final diagnoses:  Abdominal wall hernia

## 2015-12-17 NOTE — ED Provider Notes (Signed)
CSN: [id number]     Arrival date & time 12/17/15  1216 History   First MD Initiated Contact with Patient 12/17/15 1451     Chief Complaint  Patient presents with  . Abdominal Pain     (Consider location/radiation/quality/duration/timing/severity/associated sxs/prior Treatment) The history is provided by the patient.    35 year old female with history of sickle cell trait, here from [facility] hospital for further evaluation of umbilical hernia. She apparently has had this hernia for several years but pain increased today.  At [facility] hospital hernia was found to be reducible but would recur when she stood up. She reports some intermittent nausea but denies vomiting. No diarrhea. No fever or chills.  Prior abdominal surgeries include D&C of uterus.  General surgery to be consulted upon patient's arrival.  VSS.  Past Medical History  Diagnosis Date  . No pertinent past medical history   . Infection     UTI  . Sickle cell trait [facility])    Past Surgical History  Procedure Laterality Date  . Dilation and curettage of uterus     Family History  Problem Relation Age of Onset  . Anesthesia problems Neg Hx   . Hypotension Neg Hx   . Malignant hyperthermia Neg Hx   . Pseudochol deficiency Neg Hx   . Asthma Mother   . Multiple sclerosis Sister   . Cancer Maternal Grandmother   . Cancer Paternal Grandmother    Social History  Substance Use Topics  . Smoking status: Current Every Day Smoker -- 0.50 packs/day    Types: Cigarettes  . Smokeless tobacco: Never Used     Comment: unable to smoke last 2 wks  . Alcohol Use: No     Comment: occ   OB History    Gravida Para Term Preterm AB TAB SAB Ectopic Multiple Living   5 4 4  0 1 1 0 0 0 4     Review of Systems  Gastrointestinal: Positive for abdominal pain (hernia).  All other systems reviewed and are negative.     Allergies  Review of patient's allergies indicates no known allergies.  Home Medications   Prior to Admission  medications   Medication Sig Start Date End Date Taking? Authorizing Provider  HYDROcodone-acetaminophen (NORCO/VICODIN) 5-325 MG tablet Take 1 tablet by mouth every 4 (four) hours as needed. 12/17/15   Larene Pickett, PA-C  ibuprofen (ADVIL,MOTRIN) 800 MG tablet Take 1 tablet (800 mg total) by mouth 3 (three) times daily. 12/17/15   Larene Pickett, PA-C  megestrol (MEGACE) 20 MG tablet Take 2 tablets (40 mg total) by mouth 2 (two) times daily. Patient not taking: Reported on 12/17/2015 06/28/15   Luvenia Redden, PA-C  ondansetron (ZOFRAN ODT) 4 MG disintegrating tablet Take 1 tablet (4 mg total) by mouth every 8 (eight) hours as needed for nausea. 12/17/15   Larene Pickett, PA-C   BP 125/93 mmHg  Pulse 68  Temp(Src) 98.3 F (36.8 C) (Oral)  Resp 16  Wt 44.09 kg  SpO2 100%  LMP 12/12/2015   Physical Exam  Constitutional: She is oriented to person, place, and time. She appears well-developed and well-nourished.  Talking on cell phone, no distress  HENT:  Head: Normocephalic and atraumatic.  Mouth/Throat: Oropharynx is clear and moist.  Eyes: Conjunctivae and EOM are normal. Pupils are equal, round, and reactive to light.  Neck: Normal range of motion.  Cardiovascular: Normal rate, regular rhythm and normal heart sounds.   Pulmonary/Chest: Effort normal and breath  sounds normal. No respiratory distress. She has no wheezes.  Abdominal: Soft. Bowel sounds are normal. A hernia is present.  Small umbilical hernia, easily reducible, no surrounding swelling, warmth to touch, or overlying skin changes  Musculoskeletal: Normal range of motion.  Neurological: She is alert and oriented to person, place, and time.  Skin: Skin is warm and dry.  Psychiatric: She has a normal mood and affect.  Nursing note and vitals reviewed.   ED Course  Procedures (including critical care time) Labs Review Labs Reviewed  URINALYSIS, ROUTINE W REFLEX MICROSCOPIC (NOT AT [facility]) - Abnormal; Notable for the  following:    Hgb urine dipstick SMALL (*)    All other components within normal limits  URINE MICROSCOPIC-ADD ON - Abnormal; Notable for the following:    Squamous Epithelial / LPF 0-5 (*)    All other components within normal limits  POCT PREGNANCY, URINE  POCT PREGNANCY, URINE    Imaging Review No results found. I have personally reviewed and evaluated these images and lab results as part of my medical decision-making.   EKG Interpretation None      MDM   Final diagnoses:  Abdominal wall hernia   35 year old female here from [facility] hospital for further evaluation of umbilical hernia.  She is afebrile and nontoxic. She does have a small umbilical hernia on exam that is easily reducible. She has no surrounding skin changes. General surgery has evaluated patient in the ED, does not feel hernia requires emergent intervention at this time. They had scheduled her follow-up in the clinic for next Monday, 12/23/2015 with Dr. Ninfa Linden. Discharge home with small supply pain medication and Zofran. Return precautions were given for any new or worsening symptoms.  Larene Pickett, PA-C 12/17/15 1753  Leonard Schwartz, MD 12/18/15 1226

## 2015-12-17 NOTE — ED Notes (Addendum)
Per Lattie Haw PA, made secretary aware to notify general surgery that patient is here from [facility].

## 2015-12-17 NOTE — Discharge Instructions (Signed)
Take the prescribed medication as directed. Follow-up with Dr. Rush Farmer on Monday at 9:50am in the clinic. Return to the ED for new or worsening symptoms.

## 2015-12-23 ENCOUNTER — Other Ambulatory Visit: Payer: Self-pay | Admitting: Surgery

## 2016-03-02 ENCOUNTER — Encounter: Payer: Self-pay | Admitting: *Deleted

## 2016-03-02 LAB — PROCEDURE REPORT - SCANNED: Pap: NEGATIVE

## 2016-06-08 ENCOUNTER — Inpatient Hospital Stay (HOSPITAL_COMMUNITY)
Admission: AD | Admit: 2016-06-08 | Discharge: 2016-06-08 | Disposition: A | Discharge status: Home / Self Care | Payer: PRIVATE HEALTH INSURANCE | Source: Ambulatory Visit | Attending: Obstetrics and Gynecology | Admitting: Obstetrics and Gynecology

## 2016-06-08 ENCOUNTER — Encounter (HOSPITAL_COMMUNITY): Payer: Self-pay | Admitting: *Deleted

## 2016-06-08 DIAGNOSIS — Z825 Family history of asthma and other chronic lower respiratory diseases: Secondary | ICD-10-CM | POA: Diagnosis not present

## 2016-06-08 DIAGNOSIS — K429 Umbilical hernia without obstruction or gangrene: Secondary | ICD-10-CM

## 2016-06-08 DIAGNOSIS — D573 Sickle-cell trait: Secondary | ICD-10-CM | POA: Diagnosis not present

## 2016-06-08 DIAGNOSIS — Z809 Family history of malignant neoplasm, unspecified: Secondary | ICD-10-CM | POA: Diagnosis not present

## 2016-06-08 DIAGNOSIS — Z3A01 Less than 8 weeks gestation of pregnancy: Secondary | ICD-10-CM | POA: Insufficient documentation

## 2016-06-08 DIAGNOSIS — Z87440 Personal history of urinary (tract) infections: Secondary | ICD-10-CM | POA: Diagnosis not present

## 2016-06-08 DIAGNOSIS — O219 Vomiting of pregnancy, unspecified: Secondary | ICD-10-CM

## 2016-06-08 DIAGNOSIS — E860 Dehydration: Secondary | ICD-10-CM | POA: Insufficient documentation

## 2016-06-08 DIAGNOSIS — Z87891 Personal history of nicotine dependence: Secondary | ICD-10-CM | POA: Insufficient documentation

## 2016-06-08 HISTORY — DX: Umbilical hernia without obstruction or gangrene: K42.9

## 2016-06-08 LAB — URINALYSIS, ROUTINE W REFLEX MICROSCOPIC
BACTERIA UA: NONE SEEN
BILIRUBIN URINE: NEGATIVE
GLUCOSE, UA: NEGATIVE mg/dL
Ketones, ur: 80 mg/dL — AB
LEUKOCYTES UA: NEGATIVE
NITRITE: NEGATIVE
Protein, ur: 30 mg/dL — AB
SPECIFIC GRAVITY, URINE: 1.028 (ref 1.005–1.030)
pH: 5 (ref 5.0–8.0)

## 2016-06-08 LAB — POCT PREGNANCY, URINE: PREG TEST UR: POSITIVE — AB

## 2016-06-08 MED ORDER — ONDANSETRON 4 MG PO TBDP: 4.0000 mg | ORAL_TABLET | Freq: Three times a day (TID) | ORAL | 1 refills | Status: DC | PRN

## 2016-06-08 MED ORDER — DOXYLAMINE-PYRIDOXINE 10-10 MG PO TBEC: 2.0000 | DELAYED_RELEASE_TABLET | Freq: Every day | ORAL | 3 refills | Status: DC

## 2016-06-08 MED ORDER — LACTATED RINGERS IV BOLUS (SEPSIS)
1000.0000 mL | Freq: Once | INTRAVENOUS | Status: AC
  Administered 2016-06-08: 1000 mL via INTRAVENOUS

## 2016-06-08 MED ORDER — ONDANSETRON 8 MG PO TBDP
8.0000 mg | ORAL_TABLET | Freq: Once | ORAL | Status: AC
  Administered 2016-06-08: 8 mg via ORAL
  Filled 2016-06-08: qty 1

## 2016-06-08 NOTE — Discharge Instructions (Signed)
Dehydration, Adult Dehydration is when there is not enough fluid or water in your body. This happens when you lose more fluids than you take in. Dehydration can range from mild to very bad. It should be treated right away to keep it from getting very bad. Symptoms of mild dehydration may include:  Thirst.  Dry lips.  Slightly dry mouth.  Dry, warm skin.  Dizziness. Symptoms of moderate dehydration may include:  Very dry mouth.  Muscle cramps.  Dark pee (urine). Pee may be the color of tea.  Your body making less pee.  Your eyes making fewer tears.  Heartbeat that is uneven or faster than normal (palpitations).  Headache.  Light-headedness, especially when you stand up from sitting.  Fainting (syncope). Symptoms of very bad dehydration may include:  Changes in skin, such as:  Cold and clammy skin.  Blotchy (mottled) or pale skin.  Skin that does not quickly return to normal after being lightly pinched and let go (poor skin turgor).  Changes in body fluids, such as:  Feeling very thirsty.  Your eyes making fewer tears.  Not sweating when body temperature is high, such as in hot weather.  Your body making very little pee.  Changes in vital signs, such as:  Weak pulse.  Pulse that is more than 100 beats a minute when you are sitting still.  Fast breathing.  Low blood pressure.  Other changes, such as:  Sunken eyes.  Cold hands and feet.  Confusion.  Lack of energy (lethargy).  Trouble waking up from sleep.  Short-term weight loss.  Unconsciousness. Follow these instructions at home:  If told by your doctor, drink an ORS:  Make an ORS by using instructions on the package.  Start by drinking small amounts, about  cup (120 mL) every 5-10 minutes.  Slowly drink more until you have had the amount that your doctor said to have.  Drink enough clear fluid to keep your pee clear or pale yellow. If you were told to drink an ORS, finish the ORS  first, then start slowly drinking clear fluids. Drink fluids such as:  Water. Do not drink only water by itself. Doing that can make the salt (sodium) level in your body get too low (hyponatremia).  Ice chips.  Fruit juice that you have added water to (diluted).  Low-calorie sports drinks.  Avoid:  Alcohol.  Drinks that have a lot of sugar. These include high-calorie sports drinks, fruit juice that does not have water added, and soda.  Caffeine.  Foods that are greasy or have a lot of fat or sugar.  Take over-the-counter and prescription medicines only as told by your doctor.  Do not take salt tablets. Doing that can make the salt level in your body get too high (hypernatremia).  Eat foods that have minerals (electrolytes). Examples include bananas, oranges, potatoes, tomatoes, and spinach.  Keep all follow-up visits as told by your doctor. This is important. Contact a doctor if:  You have belly (abdominal) pain that:  Gets worse.  Stays in one area (localizes).  You have a rash.  You have a stiff neck.  You get angry or annoyed more easily than normal (irritability).  You are more sleepy than normal.  You have a harder time waking up than normal.  You feel:  Weak.  Dizzy.  Very thirsty.  You have peed (urinated) only a small amount of very dark pee during 6-8 hours. Get help right away if:  You have symptoms of  have symptoms of very bad dehydration. °· You cannot drink fluids without throwing up (vomiting). °· Your symptoms get worse with treatment. °· You have a fever. °· You have a very bad headache. °· You are throwing up or having watery poop (diarrhea) and it: °¨ Gets worse. °¨ Does not go away. °· You have blood or something green (bile) in your throw-up. °· You have blood in your poop (stool). This may cause poop to look black and tarry. °· You have not peed in 6-8 hours. °· You pass out (faint). °· Your heart rate when you are sitting still is more than 100 beats a  minute. °· You have trouble breathing. °This information is not intended to replace advice given to you by your health care provider. Make sure you discuss any questions you have with your health care provider. °Document Released: 03/21/2009 Document Revised: 12/13/2015 Document Reviewed: 07/19/2015 °Elsevier Interactive Patient Education © 2017 [employer] ° °Hyperemesis Gravidarum °Hyperemesis gravidarum is a severe form of nausea and vomiting that happens during pregnancy. Hyperemesis is worse than morning sickness. It may cause you to have nausea or vomiting all day for many days. It may keep you from eating and drinking enough food and liquids. Hyperemesis usually occurs during the first half (the first 20 weeks) of pregnancy. It often goes away once a woman is in her second half of pregnancy. However, sometimes hyperemesis continues through an entire pregnancy. °What are the causes? °The cause of this condition is not known. It may be related to changes in chemicals (hormones) in the body during pregnancy, such as the high level of pregnancy hormone (human chorionic gonadotropin) or the increase in the female sex hormone (estrogen). °What are the signs or symptoms? °Symptoms of this condition include: °· Severe nausea and vomiting. °· Nausea that does not go away. °· Vomiting that does not allow you to keep any food down. °· Weight loss. °· Body fluid loss (dehydration). °· Having no desire to eat, or not liking food that you have previously enjoyed. °How is this diagnosed? °This condition may be diagnosed based on: °· A physical exam. °· Your medical history. °· Your symptoms. °· Blood tests. °· Urine tests. °How is this treated? °This condition may be managed with medicine. If medicines to do not help relieve nausea and vomiting, you may need to receive fluids through an IV tube at the hospital. °Follow these instructions at home: °· Take over-the-counter and prescription medicines only as told by your  health care provider. °· Avoid iron pills and multivitamins that contain iron for the first 3-4 months of pregnancy. If you take prescription iron pills, do not stop taking them unless your health care provider approves. °· Take the following actions to help prevent nausea and vomiting: °¨ In the morning, before getting out of bed, try eating a couple of dry crackers or a piece of toast. °¨ Avoid foods and smells that upset your stomach. Fatty and spicy foods may make nausea worse. °¨ Eat 5-6 small meals a day. °¨ Do not drink fluids while eating meals. Drink between meals. °¨ Eat or suck on things that have ginger in them. Ginger can help relieve nausea. °¨ Avoid food preparation. The smell of food can spoil your appetite or trigger nausea. °· Follow instructions from your health care provider about eating or drinking restrictions. °· For snacks, eat high-protein foods, such as cheese. °· Keep all follow-up and pre-birth (prenatal) visits as told by your health care   provider. This is important. °Contact a health care provider if: °· You have pain in your abdomen. °· You have a severe headache. °· You have vision problems. °· You are losing weight. °Get help right away if: °· You cannot drink fluids without vomiting. °· You vomit blood. °· You have constant nausea and vomiting. °· You are very weak. °· You are very thirsty. °· You feel dizzy. °· You faint. °· You have a fever or other symptoms that last for more than 2-3 days. °· You have a fever and your symptoms suddenly get worse. °Summary °· Hyperemesis gravidarum is a severe form of nausea and vomiting that happens during pregnancy. °· Making some changes to your eating habits may help relieve nausea and vomiting. °· This condition may be managed with medicine. °· If medicines to do not help relieve nausea and vomiting, you may need to receive fluids through an IV tube at the hospital. °This information is not intended to replace advice given to you by your  health care provider. Make sure you discuss any questions you have with your health care provider. °Document Released: 05/25/2005 Document Revised: 01/22/2016 Document Reviewed: 01/22/2016 °Elsevier Interactive Patient Education © 2017 Elsevier Inc. ° °

## 2016-06-08 NOTE — MAU Provider Note (Signed)
History   [id number] @ 7.5 wks in with nausea and vomiting of pregnancy. States has been really sick for two days. Not able to keep anything down. Denies pain.   CSN: [id number]  Arrival date & time 06/08/16  1708   None     Chief Complaint  Patient presents with  . Emesis  . Abdominal Pain    HPI  Past Medical History:  Diagnosis Date  . Infection    UTI  . No pertinent past medical history   . Sickle cell trait ([place])   . Umbilical hernia     Past Surgical History:  Procedure Laterality Date  . DILATION AND CURETTAGE OF UTERUS      Family History  Problem Relation Age of Onset  . Cancer Maternal Grandmother   . Cancer Paternal Grandmother   . Asthma Mother   . Multiple sclerosis Sister   . Anesthesia problems Neg Hx   . Hypotension Neg Hx   . Malignant hyperthermia Neg Hx   . Pseudochol deficiency Neg Hx     Social History  Substance Use Topics  . Smoking status: Former Smoker    Packs/day: 0.50    Types: Cigarettes    Quit date: 11/21/2015  . Smokeless tobacco: Never Used     Comment: unable to smoke last 2 wks  . Alcohol use No     Comment: occ    OB History    Gravida Para Term Preterm AB Living   6 4 4  0 1 4   SAB TAB Ectopic Multiple Live Births   0 1 0 0 4      Review of Systems  Constitutional: Negative.   HENT: Negative.   Eyes: Negative.   Respiratory: Negative.   Cardiovascular: Negative.   Gastrointestinal: Positive for nausea and vomiting.  Endocrine: Negative.   Genitourinary: Negative.   Musculoskeletal: Negative.   Skin: Negative.   Allergic/Immunologic: Negative.   Neurological: Negative.   Hematological: Negative.   Psychiatric/Behavioral: Negative.     Allergies  Patient has no known allergies.  Home Medications    BP 99/67 (BP Location: Left Arm)   Pulse 89   Temp 98.6 F (37 C) (Oral)   Resp 16   Ht 5\' 4"  (1.626 m)   Wt 103 lb 6.4 oz (46.9 kg)   LMP 04/15/2016   SpO2 98%   BMI 17.75 kg/m   Physical Exam   Constitutional: She is oriented to person, place, and time. She appears well-developed and well-nourished.  HENT:  Head: Normocephalic.  Neck: Normal range of motion.  Cardiovascular: Normal rate, regular rhythm, normal heart sounds and intact distal pulses.   Pulmonary/Chest: Effort normal and breath sounds normal.  Abdominal: Soft. Bowel sounds are normal.  Musculoskeletal: Normal range of motion.  Neurological: She is alert and oriented to person, place, and time. She has normal reflexes.  Skin: Skin is warm and dry.  Psychiatric: She has a normal mood and affect. Her behavior is normal. Judgment and thought content normal.    MAU Course  Procedures (including critical care time)  Labs Reviewed  URINALYSIS, ROUTINE W REFLEX MICROSCOPIC - Abnormal; Notable for the following:       Result Value   APPearance HAZY (*)    Hgb urine dipstick SMALL (*)    Ketones, ur 80 (*)    Protein, ur 30 (*)    Squamous Epithelial / LPF 6-30 (*)    All other components within normal limits  POCT PREGNANCY, URINE -  Abnormal; Notable for the following:    Preg Test, Ur POSITIVE (*)    All other components within normal limits   No results found.   No diagnosis found.    MDM  nausea and vomiting of pregnancy, dehydration. IV hydration, zofran 8mg  ODT. Will hydrate and attempt to introduce clear liquids, if retains will d/c home. Able to keep down clear liquids. Will d/c home.

## 2016-06-08 NOTE — L&D Delivery Note (Signed)
Patient is 36 y.o. [id number] [redacted]w[redacted]d admitted in labor.  AROM at time of delivery.  Prenatal course uncomplicated  Delivery Note At 8:01 AM a viable female was delivered via Vaginal, Spontaneous Delivery (Presentation: LOA  ).  APGAR: 9, 9; weight  pending.   Placenta status: Spontaneous.  Cord: 3 vessel without complication  Anesthesia: None Episiotomy: None Lacerations: None Est. Blood Loss (mL): 50   Upon arrival patient was complete and pushing. She pushed with good maternal effort to deliver a viable female infant in cephalic, position. No nuchal cord present. Baby delivered without difficulty, was noted to have good tone and place on maternal abdomen for oral suctioning, drying and stimulation. Delayed cord clamping performed. Placenta delivered spontaneously with gentle cord traction. Fundus firm with massage and Pitocin. Perineum inspected and found to have no laceration. Counts of sharps, instruments, and lap pads were all correct.  Mom to postpartum.  Baby to Couplet care / Skin to Skin.  Georgina Snell P Thales Knipple 01/09/2017, 8:49 AM     @SIGN @

## 2016-06-08 NOTE — MAU Note (Signed)
Pt states she is having abdominal pain.  Pt states she has a hernia.  Pt states she was supposed to have surgery on the hernia and did not.  Pt states last night and today she has been vomiting yellow and green with a little bit of blood.  Pt states no food comes up but she can't keep liquids down.  Pt is not sure if she is pregnant.

## 2016-07-04 ENCOUNTER — Encounter (HOSPITAL_COMMUNITY): Payer: Self-pay | Admitting: *Deleted

## 2016-07-04 ENCOUNTER — Inpatient Hospital Stay (HOSPITAL_COMMUNITY)
Admission: AD | Admit: 2016-07-04 | Discharge: 2016-07-05 | Disposition: A | Discharge status: Home / Self Care | Payer: Medicaid Other | Source: Ambulatory Visit | Attending: Obstetrics and Gynecology | Admitting: Obstetrics and Gynecology

## 2016-07-04 DIAGNOSIS — O99611 Diseases of the digestive system complicating pregnancy, first trimester: Secondary | ICD-10-CM

## 2016-07-04 DIAGNOSIS — Z3A11 11 weeks gestation of pregnancy: Secondary | ICD-10-CM | POA: Diagnosis not present

## 2016-07-04 DIAGNOSIS — Z809 Family history of malignant neoplasm, unspecified: Secondary | ICD-10-CM | POA: Insufficient documentation

## 2016-07-04 DIAGNOSIS — R51 Headache: Secondary | ICD-10-CM | POA: Diagnosis not present

## 2016-07-04 DIAGNOSIS — R519 Headache, unspecified: Secondary | ICD-10-CM

## 2016-07-04 DIAGNOSIS — O99612 Diseases of the digestive system complicating pregnancy, second trimester: Secondary | ICD-10-CM | POA: Insufficient documentation

## 2016-07-04 DIAGNOSIS — A599 Trichomoniasis, unspecified: Secondary | ICD-10-CM

## 2016-07-04 DIAGNOSIS — Z87891 Personal history of nicotine dependence: Secondary | ICD-10-CM | POA: Insufficient documentation

## 2016-07-04 DIAGNOSIS — O219 Vomiting of pregnancy, unspecified: Secondary | ICD-10-CM

## 2016-07-04 DIAGNOSIS — O26891 Other specified pregnancy related conditions, first trimester: Secondary | ICD-10-CM | POA: Diagnosis not present

## 2016-07-04 DIAGNOSIS — Z825 Family history of asthma and other chronic lower respiratory diseases: Secondary | ICD-10-CM | POA: Insufficient documentation

## 2016-07-04 DIAGNOSIS — K5900 Constipation, unspecified: Secondary | ICD-10-CM

## 2016-07-04 DIAGNOSIS — O210 Mild hyperemesis gravidarum: Secondary | ICD-10-CM | POA: Insufficient documentation

## 2016-07-04 DIAGNOSIS — R1903 Right lower quadrant abdominal swelling, mass and lump: Secondary | ICD-10-CM

## 2016-07-04 DIAGNOSIS — O98312 Other infections with a predominantly sexual mode of transmission complicating pregnancy, second trimester: Secondary | ICD-10-CM | POA: Diagnosis not present

## 2016-07-04 DIAGNOSIS — D573 Sickle-cell trait: Secondary | ICD-10-CM | POA: Insufficient documentation

## 2016-07-04 DIAGNOSIS — Z3491 Encounter for supervision of normal pregnancy, unspecified, first trimester: Secondary | ICD-10-CM

## 2016-07-04 DIAGNOSIS — O26892 Other specified pregnancy related conditions, second trimester: Secondary | ICD-10-CM | POA: Insufficient documentation

## 2016-07-04 DIAGNOSIS — R188 Other ascites: Secondary | ICD-10-CM | POA: Insufficient documentation

## 2016-07-04 HISTORY — DX: Anxiety disorder, unspecified: F41.9

## 2016-07-04 LAB — URINALYSIS, ROUTINE W REFLEX MICROSCOPIC
BILIRUBIN URINE: NEGATIVE
GLUCOSE, UA: NEGATIVE mg/dL
Hgb urine dipstick: NEGATIVE
KETONES UR: NEGATIVE mg/dL
Nitrite: NEGATIVE
PH: 7 (ref 5.0–8.0)
Protein, ur: 30 mg/dL — AB
Specific Gravity, Urine: 1.021 (ref 1.005–1.030)

## 2016-07-04 MED ORDER — METOCLOPRAMIDE HCL 5 MG/ML IJ SOLN
10.0000 mg | Freq: Once | INTRAMUSCULAR | Status: AC
  Administered 2016-07-05: 10 mg via INTRAVENOUS
  Filled 2016-07-04: qty 2

## 2016-07-04 MED ORDER — DEXAMETHASONE SODIUM PHOSPHATE 10 MG/ML IJ SOLN
10.0000 mg | Freq: Once | INTRAMUSCULAR | Status: AC
  Administered 2016-07-05: 10 mg via INTRAVENOUS
  Filled 2016-07-04: qty 1

## 2016-07-04 MED ORDER — DIPHENHYDRAMINE HCL 50 MG/ML IJ SOLN
25.0000 mg | Freq: Once | INTRAMUSCULAR | Status: AC
  Administered 2016-07-05: 25 mg via INTRAVENOUS
  Filled 2016-07-04: qty 1

## 2016-07-04 MED ORDER — LACTATED RINGERS IV BOLUS (SEPSIS)
1000.0000 mL | Freq: Once | INTRAVENOUS | Status: AC
  Administered 2016-07-05: 1000 mL via INTRAVENOUS

## 2016-07-04 NOTE — MAU Provider Note (Signed)
History     CSN: [id number]  Arrival date and time: 07/04/16 2253   First Provider Initiated Contact with Patient 07/04/16 2346      Chief Complaint  Patient presents with  . Morning Sickness  . Abdominal Pain  . Vaginal Discharge   HPI  Veronica Green is a 36 y.o. [id number] at [redacted]w[redacted]d by LMP who presents with headache, abdominal pain, n/v, & vaginal discharge. States all of her symptoms began 5 days ago.  -Patient has history of headaches but states this if her first one like that in years. Describes as constant throbbing in frontal area. Rates pain 10/10. Has been taking tylenol and motrin without relief. Lights makes pain worse. Nothing makes pain better.  -Lower abdominal cramping is constant and has worsened today. Rates pain 10/10. Nothing makes pain better or worse. Has felt distended for the last 2 months. Complains of constipation. Last BM was 5 days ago; states she normally has BM daily. Has not tried anything to treat constipation. At times reports feeling urge like she needs to have a BM but is unable to have a movement.  -Vaginal discharge with watery & white. Had slight foul odor to it. Denies vaginal irritation. No dyspareunia, vaginal bleeding,or postcoital bleeding.  -Nausea & vomiting for the last month. Has been taking diclegis with mild relief but doesn't like to take it because it makes her sleepy. Has vomited 5 times today. Attempted to eat at 7 pm; had a hot pocket but did not keep it down. Endorses heartburn that she has taken tums for with mild relief.   OB History    Gravida Para Term Preterm AB Living   6 4 4  0 1 4   SAB TAB Ectopic Multiple Live Births   0 1 0 0 4      Past Medical History:  Diagnosis Date  . Anxiety   . Sickle cell trait ([place])   . Umbilical hernia     Past Surgical History:  Procedure Laterality Date  . DILATION AND CURETTAGE OF UTERUS      Family History  Problem Relation Age of Onset  . Cancer Maternal Grandmother   . Cancer  Paternal Grandmother   . Asthma Mother   . Multiple sclerosis Sister   . Anesthesia problems Neg Hx   . Hypotension Neg Hx   . Malignant hyperthermia Neg Hx   . Pseudochol deficiency Neg Hx     Social History  Substance Use Topics  . Smoking status: Former Smoker    Packs/day: 0.50    Types: Cigarettes    Quit date: 11/21/2015  . Smokeless tobacco: Never Used     Comment: unable to smoke last 2 wks  . Alcohol use No     Comment: occ    Allergies: No Known Allergies  Prescriptions Prior to Admission  Medication Sig Dispense Refill Last Dose  . calcium carbonate (TUMS - DOSED IN MG ELEMENTAL CALCIUM) 500 MG chewable tablet Chew 1 tablet by mouth daily.   07/04/2016 at Unknown time  . Doxylamine-Pyridoxine (DICLEGIS) 10-10 MG TBEC Take 2 tablets by mouth at bedtime. If symptoms persist add one tablet every morning starting on day 3. If symptoms persist add 1 tablet every afternoon on day 4. 60 tablet 3 Past Month at Unknown time  . HYDROcodone-acetaminophen (NORCO/VICODIN) 5-325 MG tablet Take 1 tablet by mouth every 4 (four) hours as needed. 12 tablet 0   . ibuprofen (ADVIL,MOTRIN) 800 MG tablet Take 1 tablet (800 mg  total) by mouth 3 (three) times daily. (Patient taking differently: Take 600 mg by mouth 3 (three) times daily. ) 21 tablet 0  at 1800  . megestrol (MEGACE) 20 MG tablet Take 2 tablets (40 mg total) by mouth 2 (two) times daily. (Patient not taking: Reported on 12/17/2015) 160 tablet 0   . ondansetron (ZOFRAN ODT) 4 MG disintegrating tablet Take 1 tablet (4 mg total) by mouth every 8 (eight) hours as needed for nausea. 10 tablet 0   . ondansetron (ZOFRAN ODT) 4 MG disintegrating tablet Take 1 tablet (4 mg total) by mouth every 8 (eight) hours as needed for nausea or vomiting. 20 tablet 1     Review of Systems  Constitutional: Negative.   Gastrointestinal: Positive for abdominal distention, abdominal pain, constipation, nausea and vomiting. Negative for diarrhea.   Genitourinary: Positive for pelvic pain and vaginal discharge. Negative for dyspareunia, dysuria and vaginal bleeding.  Neurological: Positive for headaches.   Physical Exam   Blood pressure 113/64, pulse 85, temperature 98.3 F (36.8 C), resp. rate 18, height 5\' 4"  (1.626 m), weight 109 lb 12.8 oz (49.8 kg), last menstrual period 04/15/2016, unknown if currently breastfeeding.  Physical Exam  Nursing note and vitals reviewed. Constitutional: She is oriented to person, place, and time. She appears well-developed and well-nourished. No distress.  HENT:  Head: Normocephalic and atraumatic.  Eyes: Conjunctivae are normal. Right eye exhibits no discharge. Left eye exhibits no discharge. No scleral icterus.  Neck: Normal range of motion.  Cardiovascular: Normal rate, regular rhythm and normal heart sounds.   No murmur heard. Respiratory: Effort normal and breath sounds normal. No respiratory distress. She has no wheezes.  GI: Soft. She exhibits distension and mass (RLQ - firm & mobile). Bowel sounds are decreased. There is no tenderness. There is no rebound and no guarding.  Neurological: She is alert and oriented to person, place, and time.  Skin: Skin is warm and dry. She is not diaphoretic.  Psychiatric: She has a normal mood and affect. Her behavior is normal. Judgment and thought content normal.    MAU Course  Procedures Results for orders placed or performed during the hospital encounter of 07/04/16 (from the past 24 hour(s))  Urinalysis, Routine w reflex microscopic     Status: Abnormal   Collection Time: 07/04/16 11:20 PM  Result Value Ref Range   Color, Urine YELLOW YELLOW   APPearance CLOUDY (A) CLEAR   Specific Gravity, Urine 1.021 1.005 - 1.030   pH 7.0 5.0 - 8.0   Glucose, UA NEGATIVE NEGATIVE mg/dL   Hgb urine dipstick NEGATIVE NEGATIVE   Bilirubin Urine NEGATIVE NEGATIVE   Ketones, ur NEGATIVE NEGATIVE mg/dL   Protein, ur 30 (A) NEGATIVE mg/dL   Nitrite NEGATIVE  NEGATIVE   Leukocytes, UA MODERATE (A) NEGATIVE   RBC / HPF 6-30 0 - 5 RBC/hpf   WBC, UA 6-30 0 - 5 WBC/hpf   Bacteria, UA RARE (A) NONE SEEN   Squamous Epithelial / LPF TOO NUMEROUS TO COUNT (A) NONE SEEN   Mucous PRESENT   CBC     Status: Abnormal   Collection Time: 07/05/16 12:24 AM  Result Value Ref Range   WBC 11.8 (H) 4.0 - 10.5 K/uL   RBC 4.22 3.87 - 5.11 MIL/uL   Hemoglobin 12.9 12.0 - 15.0 g/dL   HCT 34.8 (L) 36.0 - 46.0 %   MCV 82.5 78.0 - 100.0 fL   MCH 30.6 26.0 - 34.0 pg   MCHC 37.1 (H) 30.0 -  36.0 g/dL   RDW 12.5 11.5 - 15.5 %   Platelets 413 (H) 150 - 400 K/uL  Comprehensive metabolic panel     Status: Abnormal   Collection Time: 07/05/16 12:24 AM  Result Value Ref Range   Sodium 133 (L) 135 - 145 mmol/L   Potassium 3.5 3.5 - 5.1 mmol/L   Chloride 101 101 - 111 mmol/L   CO2 24 22 - 32 mmol/L   Glucose, Bld 86 65 - 99 mg/dL   BUN 6 6 - 20 mg/dL   Creatinine, Ser 0.39 (L) 0.44 - 1.00 mg/dL   Calcium 8.5 (L) 8.9 - 10.3 mg/dL   Total Protein 5.9 (L) 6.5 - 8.1 g/dL   Albumin 2.8 (L) 3.5 - 5.0 g/dL   AST 11 (L) 15 - 41 U/L   ALT 12 (L) 14 - 54 U/L   Alkaline Phosphatase 41 38 - 126 U/L   Total Bilirubin 0.3 0.3 - 1.2 mg/dL   GFR calc non Af Amer >60 >60 mL/min   GFR calc Af Amer >60 >60 mL/min   Anion gap 8 5 - 15  Wet prep, genital     Status: Abnormal   Collection Time: 07/05/16  3:30 AM  Result Value Ref Range   Yeast Wet Prep HPF POC NONE SEEN NONE SEEN   Trich, Wet Prep PRESENT (A) NONE SEEN   Clue Cells Wet Prep HPF POC PRESENT (A) NONE SEEN   WBC, Wet Prep HPF POC MODERATE (A) NONE SEEN   Sperm NONE SEEN    US Ob Comp Less 14 Wks  Result Date: 07/05/2016 CLINICAL DATA:  Initial evaluation for abdominal distension, currently pregnant. EXAM: OBSTETRIC <14 WK ULTRASOUND TECHNIQUE: Transabdominal ultrasound was performed for evaluation of the gestation as well as the maternal uterus and adnexal regions. COMPARISON:  Comparison made with prior CT from  09/30/2013. FINDINGS: Intrauterine gestational sac: Single Yolk sac:  Not visualized. Embryo:  Present Cardiac Activity: Present Heart Rate: 157 bpm MSD:   mm    w     d CRL:   46  mm   11 w 3 d                  [place] EDC: 01/21/2017 Subchorionic hemorrhage:  None visualized. Maternal uterus/adnexae: Large heterogeneous partially solid and partially cystic mass within the pelvis, extending into the lower abdomen. Lesion measured 12.2 x 12.0 x 13.5 cm, and appears to be separate from the uterus. Some peripheral vascularity evident within this lesion. Associated large volume ascites with an all 4 quadrants of the abdomen. IMPRESSION: 1. Single viable IUP as above. 2. 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 Further evaluation with dedicated MRI of the abdomen and pelvis is recommended. MRI without contrast is recommended, as no gadolinium should be administered given the current pregnancy status. 3. Large volume ascites. Electronically Signed   By: Jeannine Boga M.D.   On: 07/05/2016 02:57    MDM Unable to doppler FHT -- will order ultrasound --- IUP at [redacted]w[redacted]d with cardiac activity CBC, CMP IV fluid bolus & headache cocktail (decadron, benadryl, reglan) -- pt reports improvement in symptoms GC/CT & wet prep --- + trich. Discussed with patent, Flagyl 2 gm PO in MAU Ultrasound shows 12 cm pelvic mass concerning for neoplasm & large volume ascites. Discussed exam & results of ultrasound with Dr. Elly Modena. Will order outpatient MRI & have pt follow up with gyn/onc.  Pt will also keep scheduled prenatal appt with [facility] [place]  Assessment and Plan  A; 1. Headache in pregnancy, antepartum, first trimester   2. Fetal heart tones present, first trimester   3. Nausea and vomiting during pregnancy prior to [redacted] weeks gestation   4. Constipation during pregnancy in first trimester   5. Trichomonas infection   6. Right lower quadrant  abdominal mass    P: Discharge home Continue diclegis as prescribed -- discuss increasing doses as needed -- declines additional antiemetics Outpatient MRI ordered -- abdominal & pelvis without contrast per recommendation from radiologist Msg to [facility]le[facility] to gyn/onc consult & to have ultrasound reviewed by them while MRI pending Discussed tx of constipation -- stool softeners & laxative prn -- OTC meds safe in pregnancy list given Discussed reasons to return to [place] 07/04/2016, 11:45 PM

## 2016-07-04 NOTE — MAU Note (Addendum)
Headache for couple days. Ibuprofen and tylenol not helping. Abd cramping. White vag d/c with some odor. Vomited about 5 times today.  Diclegis was helping some and had stopped because nausea better and makes me sleepy. Today has just been worse

## 2016-07-05 ENCOUNTER — Inpatient Hospital Stay (HOSPITAL_COMMUNITY): Payer: Medicaid Other

## 2016-07-05 DIAGNOSIS — O26891 Other specified pregnancy related conditions, first trimester: Secondary | ICD-10-CM

## 2016-07-05 DIAGNOSIS — R51 Headache: Secondary | ICD-10-CM | POA: Diagnosis not present

## 2016-07-05 LAB — COMPREHENSIVE METABOLIC PANEL
ALBUMIN: 2.8 g/dL — AB (ref 3.5–5.0)
ALT: 12 U/L — ABNORMAL LOW (ref 14–54)
ANION GAP: 8 (ref 5–15)
AST: 11 U/L — ABNORMAL LOW (ref 15–41)
Alkaline Phosphatase: 41 U/L (ref 38–126)
BILIRUBIN TOTAL: 0.3 mg/dL (ref 0.3–1.2)
BUN: 6 mg/dL (ref 6–20)
CO2: 24 mmol/L (ref 22–32)
Calcium: 8.5 mg/dL — ABNORMAL LOW (ref 8.9–10.3)
Chloride: 101 mmol/L (ref 101–111)
Creatinine, Ser: 0.39 mg/dL — ABNORMAL LOW (ref 0.44–1.00)
GLUCOSE: 86 mg/dL (ref 65–99)
POTASSIUM: 3.5 mmol/L (ref 3.5–5.1)
Sodium: 133 mmol/L — ABNORMAL LOW (ref 135–145)
TOTAL PROTEIN: 5.9 g/dL — AB (ref 6.5–8.1)

## 2016-07-05 LAB — HIV ANTIBODY (ROUTINE TESTING W REFLEX): HIV Screen 4th Generation wRfx: NONREACTIVE

## 2016-07-05 LAB — CBC
HEMATOCRIT: 34.8 % — AB (ref 36.0–46.0)
Hemoglobin: 12.9 g/dL (ref 12.0–15.0)
MCH: 30.6 pg (ref 26.0–34.0)
MCHC: 37.1 g/dL — AB (ref 30.0–36.0)
MCV: 82.5 fL (ref 78.0–100.0)
Platelets: 413 10*3/uL — ABNORMAL HIGH (ref 150–400)
RBC: 4.22 MIL/uL (ref 3.87–5.11)
RDW: 12.5 % (ref 11.5–15.5)
WBC: 11.8 10*3/uL — ABNORMAL HIGH (ref 4.0–10.5)

## 2016-07-05 LAB — WET PREP, GENITAL
Sperm: NONE SEEN
YEAST WET PREP: NONE SEEN

## 2016-07-05 MED ORDER — METRONIDAZOLE 500 MG PO TABS
2000.0000 mg | ORAL_TABLET | Freq: Once | ORAL | Status: AC
  Administered 2016-07-05: 2000 mg via ORAL
  Filled 2016-07-05: qty 4

## 2016-07-05 NOTE — Progress Notes (Signed)
Written and verbal d/c instructions given and understanding voiced. 

## 2016-07-05 NOTE — Discharge Instructions (Signed)
Trichomoniasis Trichomoniasis is an infection caused by an organism called Trichomonas. The infection can affect both women and men. In women, the outer female genitalia and the vagina are affected. In men, the penis is mainly affected, but the prostate and other reproductive organs can also be involved. Trichomoniasis is a sexually transmitted infection (STI) and is most often passed to another person through sexual contact.  RISK FACTORS  Having unprotected sexual intercourse.  Having sexual intercourse with an infected partner. SIGNS AND SYMPTOMS  Symptoms of trichomoniasis in women include:  Abnormal gray-green frothy vaginal discharge.  Itching and irritation of the vagina.  Itching and irritation of the area outside the vagina. Symptoms of trichomoniasis in men include:   Penile discharge with or without pain.  Pain during urination. This results from inflammation of the urethra. DIAGNOSIS  Trichomoniasis may be found during a Pap test or physical exam. Your health care provider may use one of the following methods to help diagnose this infection:  Testing the pH of the vagina with a test tape.  Using a vaginal swab test that checks for the Trichomonas organism. A test is available that provides results within a few minutes.  Examining a urine sample.  Testing vaginal secretions. Your health care provider may test you for other STIs, including HIV. TREATMENT   You may be given medicine to fight the infection. Women should inform their health care provider if they could be or are pregnant. Some medicines used to treat the infection should not be taken during pregnancy.  Your health care provider may recommend over-the-counter medicines or creams to decrease itching or irritation.  Your sexual partner will need to be treated if infected.  Your health care provider may test you for infection again 3 months after treatment. HOME CARE INSTRUCTIONS   Take medicines only as  directed by your health care provider.  Take over-the-counter medicine for itching or irritation as directed by your health care provider.  Do not have sexual intercourse while you have the infection.  Women should not douche or wear tampons while they have the infection.  Discuss your infection with your partner. Your partner may have gotten the infection from you, or you may have gotten it from your partner.  Have your sex partner get examined and treated if necessary.  Practice safe, informed, and protected sex.  See your health care provider for other STI testing. SEEK MEDICAL CARE IF:   You still have symptoms after you finish your medicine.  You develop abdominal pain.  You have pain when you urinate.  You have bleeding after sexual intercourse.  You develop a rash.  Your medicine makes you sick or makes you throw up (vomit). MAKE SURE YOU:  Understand these instructions.  Will watch your condition.  Will get help right away if you are not doing well or get worse. This information is not intended to replace advice given to you by your health care provider. Make sure you discuss any questions you have with your health care provider. Document Released: 11/18/2000 Document Revised: 06/15/2014 Document Reviewed: 03/06/2013 Elsevier Interactive Patient Education  2017 [employer]. Constipation, Adult Constipation is when a person has fewer bowel movements in a week than normal, has difficulty having a bowel movement, or has stools that are dry, hard, or larger than normal. Constipation may be caused by an underlying condition. It may become worse with age if a person takes certain medicines and does not take in enough fluids. Follow these instructions  at home: Eating and drinking  Eat foods that have a lot of fiber, such as fresh fruits and vegetables, whole grains, and beans.  Limit foods that are high in fat, low in fiber, or overly processed, such as french fries,  hamburgers, cookies, candies, and soda.  Drink enough fluid to keep your urine clear or pale yellow. General instructions  Exercise regularly or as told by your health care provider.  Go to the restroom when you have the urge to go. Do not hold it in.  Take over-the-counter and prescription medicines only as told by your health care provider. These include any fiber supplements.  Practice pelvic floor retraining exercises, such as deep breathing while relaxing the lower abdomen and pelvic floor relaxation during bowel movements.  Watch your condition for any changes.  Keep all follow-up visits as told by your health care provider. This is important. Contact a health care provider if:  You have pain that gets worse.  You have a fever.  You do not have a bowel movement after 4 days.  You vomit.  You are not hungry.  You lose weight.  You are bleeding from the anus.  You have thin, pencil-like stools. Get help right away if:  You have a fever and your symptoms suddenly get worse.  You leak stool or have blood in your stool.  Your abdomen is bloated.  You have severe pain in your abdomen.  You feel dizzy or you faint. This information is not intended to replace advice given to you by your health care provider. Make sure you discuss any questions you have with your health care provider. Document Released: 02/21/2004 Document Revised: 12/13/2015 Document Reviewed: 11/13/2015 Elsevier Interactive Patient Education  2017 [employer]  Morning Sickness Morning sickness is when you feel sick to your stomach (nauseous) during pregnancy. This nauseous feeling may or may not come with vomiting. It often occurs in the morning but can be a problem any time of day. Morning sickness is most common during the first trimester, but it may continue throughout pregnancy. While morning sickness is unpleasant, it is usually harmless unless you develop severe and continual vomiting  (hyperemesis gravidarum). This condition requires more intense treatment. What are the causes? The cause of morning sickness is not completely known but seems to be related to normal hormonal changes that occur in pregnancy. What increases the risk? You are at greater risk if you:  Experienced nausea or vomiting before your pregnancy.  Had morning sickness during a previous pregnancy.  Are pregnant with more than one baby, such as twins. How is this treated? Do not use any medicines (prescription, over-the-counter, or herbal) for morning sickness without first talking to your health care provider. Your health care provider may prescribe or recommend:  Vitamin B6 supplements.  Anti-nausea medicines.  The herbal medicine ginger. Follow these instructions at home:  Only take over-the-counter or prescription medicines as directed by your health care provider.  Taking multivitamins before getting pregnant can prevent or decrease the severity of morning sickness in most women.  Eat a piece of dry toast or unsalted crackers before getting out of bed in the morning.  Eat five or six small meals a day.  Eat dry and bland foods (rice, baked potato). Foods high in carbohydrates are often helpful.  Do not drink liquids with your meals. Drink liquids between meals.  Avoid greasy, fatty, and spicy foods.  Get someone to cook for you if the smell of any food  causes nausea and vomiting.  If you feel nauseous after taking prenatal vitamins, take the vitamins at night or with a snack.  Snack on protein foods (nuts, yogurt, cheese) between meals if you are hungry.  Eat unsweetened gelatins for desserts.  Wearing an acupressure wristband (worn for sea sickness) may be helpful.  Acupuncture may be helpful.  Do not smoke.  Get a humidifier to keep the air in your house free of odors.  Get plenty of fresh air. Contact a health care provider if:  Your home remedies are not working, and  you need medicine.  You feel dizzy or lightheaded.  You are losing weight. Get help right away if:  You have persistent and uncontrolled nausea and vomiting.  You pass out (faint). This information is not intended to replace advice given to you by your health care provider. Make sure you discuss any questions you have with your health care provider. Document Released: 07/16/2006 Document Revised: 10/31/2015 Document Reviewed: 11/09/2012 Elsevier Interactive Patient Education  2017 [place] Medications in Pregnancy   Acne: Benzoyl Peroxide Salicylic Acid  Backache/Headache: Tylenol: 2 regular strength every 4 hours OR              2 Extra strength every 6 hours  Colds/Coughs/Allergies: Benadryl (alcohol free) 25 mg every 6 hours as needed Breath right strips Claritin Cepacol throat lozenges Chloraseptic throat spray Cold-Eeze- up to three times per day Cough drops, alcohol free Flonase (by prescription only) Guaifenesin Mucinex Robitussin DM (plain only, alcohol free) Saline nasal spray/drops Sudafed (pseudoephedrine) & Actifed ** use only after [redacted] weeks gestation and if you do not have high blood pressure Tylenol Vicks Vaporub Zinc lozenges Zyrtec   Constipation: Colace Ducolax suppositories Fleet enema Glycerin suppositories Metamucil Milk of magnesia Miralax Senokot Smooth move tea  Diarrhea: Kaopectate Imodium A-D  *NO pepto Bismol  Hemorrhoids: Anusol Anusol HC Preparation H Tucks  Indigestion: Tums Maalox Mylanta Zantac  Pepcid  Insomnia: Benadryl (alcohol free) 25mg  every 6 hours as needed Tylenol PM Unisom, no Gelcaps  Leg Cramps: Tums MagGel  Nausea/Vomiting:  Bonine Dramamine Emetrol Ginger extract Sea bands Meclizine  Nausea medication to take during pregnancy:  Unisom (doxylamine succinate 25 mg tablets) Take one tablet daily at bedtime. If symptoms are not adequately controlled, the dose can be  increased to a maximum recommended dose of two tablets daily (1/2 tablet in the morning, 1/2 tablet mid-afternoon and one at bedtime). Vitamin B6 100mg  tablets. Take one tablet twice a day (up to 200 mg per day).  Skin Rashes: Aveeno products Benadryl cream or 25mg  every 6 hours as needed Calamine Lotion 1% cortisone cream  Yeast infection: Gyne-lotrimin 7 Monistat 7  Gum/tooth pain: Anbesol  **If taking multiple medications, please check labels to avoid duplicating the same active ingredients **take medication as directed on the label ** Do not exceed 4000 mg of tylenol in 24 hours **Do not take medications that contain aspirin or ibuprofen

## 2016-07-06 ENCOUNTER — Telehealth: Payer: Self-pay | Admitting: Gynecologic Oncology

## 2016-07-06 ENCOUNTER — Telehealth: Payer: Self-pay | Admitting: General Practice

## 2016-07-06 NOTE — Telephone Encounter (Signed)
Spoke with Danton Sewer at [facility] about Dr. Elenora Gamma recommendations for the patient to be seen in the office based on reviewing the information sent in EPIC.  Appt given for 2/5 with Dr. Denman George.

## 2016-07-06 NOTE — Telephone Encounter (Signed)
Called patient & informed her of GYN ONC appt. Patient verbalized understanding & had no questions

## 2016-07-07 ENCOUNTER — Encounter (HOSPITAL_COMMUNITY): Payer: Self-pay | Admitting: Emergency Medicine

## 2016-07-07 ENCOUNTER — Emergency Department (HOSPITAL_COMMUNITY)
Admission: EM | Admit: 2016-07-07 | Discharge: 2016-07-08 | Disposition: A | Discharge status: Home / Self Care | Payer: Medicaid Other | Attending: Emergency Medicine | Admitting: Emergency Medicine

## 2016-07-07 DIAGNOSIS — Z3A12 12 weeks gestation of pregnancy: Secondary | ICD-10-CM | POA: Insufficient documentation

## 2016-07-07 DIAGNOSIS — Z87891 Personal history of nicotine dependence: Secondary | ICD-10-CM | POA: Insufficient documentation

## 2016-07-07 DIAGNOSIS — R188 Other ascites: Secondary | ICD-10-CM

## 2016-07-07 DIAGNOSIS — O9989 Other specified diseases and conditions complicating pregnancy, childbirth and the puerperium: Secondary | ICD-10-CM | POA: Diagnosis present

## 2016-07-07 LAB — CBC WITH DIFFERENTIAL/PLATELET
BASOS ABS: 0 10*3/uL (ref 0.0–0.1)
BASOS PCT: 0 %
Eosinophils Absolute: 0.1 10*3/uL (ref 0.0–0.7)
Eosinophils Relative: 1 %
HCT: 34.6 % — ABNORMAL LOW (ref 36.0–46.0)
HEMOGLOBIN: 12.5 g/dL (ref 12.0–15.0)
Lymphocytes Relative: 23 %
Lymphs Abs: 2.8 10*3/uL (ref 0.7–4.0)
MCH: 31 pg (ref 26.0–34.0)
MCHC: 36.1 g/dL — AB (ref 30.0–36.0)
MCV: 85.9 fL (ref 78.0–100.0)
MONOS PCT: 5 %
Monocytes Absolute: 0.7 10*3/uL (ref 0.1–1.0)
Neutro Abs: 8.8 10*3/uL — ABNORMAL HIGH (ref 1.7–7.7)
Neutrophils Relative %: 71 %
Platelets: 362 10*3/uL (ref 150–400)
RBC: 4.03 MIL/uL (ref 3.87–5.11)
RDW: 13.1 % (ref 11.5–15.5)
WBC: 12.4 10*3/uL — ABNORMAL HIGH (ref 4.0–10.5)

## 2016-07-07 LAB — BASIC METABOLIC PANEL
ANION GAP: 5 (ref 5–15)
BUN: 7 mg/dL (ref 6–20)
CO2: 24 mmol/L (ref 22–32)
Calcium: 7.9 mg/dL — ABNORMAL LOW (ref 8.9–10.3)
Chloride: 104 mmol/L (ref 101–111)
Creatinine, Ser: 0.34 mg/dL — ABNORMAL LOW (ref 0.44–1.00)
GFR calc non Af Amer: >60 mL/min (ref >60)
Glucose, Bld: 55 mg/dL — ABNORMAL LOW (ref 65–99)
POTASSIUM: 3.1 mmol/L — AB (ref 3.5–5.1)
Sodium: 133 mmol/L — ABNORMAL LOW (ref 135–145)

## 2016-07-07 LAB — PROTIME-INR
INR: 0.9
Prothrombin Time: 12.1 seconds (ref 11.4–15.2)

## 2016-07-07 LAB — GC/CHLAMYDIA PROBE AMP (~~LOC~~) NOT AT ARMC
CHLAMYDIA, DNA PROBE: NEGATIVE
Neisseria Gonorrhea: NEGATIVE

## 2016-07-07 MED ORDER — METOCLOPRAMIDE HCL 10 MG PO TABS
10.0000 mg | ORAL_TABLET | Freq: Once | ORAL | Status: AC
  Administered 2016-07-07: 10 mg via ORAL
  Filled 2016-07-07: qty 1

## 2016-07-07 MED ORDER — DIPHENHYDRAMINE HCL 25 MG PO CAPS
25.0000 mg | ORAL_CAPSULE | Freq: Once | ORAL | Status: AC
  Administered 2016-07-07: 25 mg via ORAL
  Filled 2016-07-07: qty 1

## 2016-07-07 MED ORDER — ACETAMINOPHEN 500 MG PO TABS
1000.0000 mg | ORAL_TABLET | Freq: Once | ORAL | Status: AC
  Administered 2016-07-07: 1000 mg via ORAL
  Filled 2016-07-07: qty 2

## 2016-07-07 NOTE — ED Provider Notes (Signed)
[facility] DEPT Provider Note   CSN: [id number] Arrival date & time: 07/07/16  1652  By signing my name below, I, Norris Cross, attest that this documentation has been prepared under the direction and in the presence of Margarita Mail, PA-C. Electronically Signed: Ruta Hinds., ED Scribe. 07/07/16. 9:49 PM.   History   Chief Complaint Chief Complaint  Patient presents with  . Ascites  . 3 Months Pregnant    HPI  Veronica Green is a 36 y.o. female [phone] who presents to the Emergency Department complaining of abdominal pain. Pt was seen at the MAU on 11/27 for similar symptoms and pelvic ultrasound was found to have a large ovarian mass as well as large volume ascites. She is set up for outpatient MRI abd/pelv at GYN oncology follow-up. Pt states that she is here due to constant abdominal cramping and distention.She states that upon ultrasound she was told that her liver was normal. She expresses concern for the abdominal pain and distention . Pt states that she cannot perform normal daily activities without becoming SOB. Pt reports headache, nausea, vomiting, SOB, abdominal cramping, abdominal distention, back pain, decreased sleep. She has taken Tylenol for the pain with no relief.    The history is provided by the patient. No language interpreter was used.    Past Medical History:  Diagnosis Date  . Anxiety   . Sickle cell trait ([place])   . Umbilical hernia     Patient Active Problem List   Diagnosis Date Noted  . Normal labor 09/14/2014  . NVD (normal vaginal delivery) 09/14/2014  . [redacted] weeks gestation of pregnancy   . Fetal heart rate nonreactive     Past Surgical History:  Procedure Laterality Date  . DILATION AND CURETTAGE OF UTERUS      OB History    Gravida Para Term Preterm AB Living   6 4 4  0 1 4   SAB TAB Ectopic Multiple Live Births   0 1 0 0 4       Home Medications    Prior to Admission medications   Medication Sig Start Date End Date  Taking? Authorizing Provider  calcium carbonate (TUMS - DOSED IN MG ELEMENTAL CALCIUM) 500 MG chewable tablet Chew 1 tablet by mouth daily.   Yes Historical Provider, MD  ibuprofen (ADVIL,MOTRIN) 800 MG tablet Take 1 tablet (800 mg total) by mouth 3 (three) times daily. Patient taking differently: Take 400 mg by mouth 3 (three) times daily.  12/17/15  Yes Larene Pickett, PA-C  Doxylamine-Pyridoxine (DICLEGIS) 10-10 MG TBEC Take 2 tablets by mouth at bedtime. If symptoms persist add one tablet every morning starting on day 3. If symptoms persist add 1 tablet every afternoon on day 4. Patient not taking: Reported on 07/07/2016 06/08/16   Tresea Mall, CNM  megestrol (MEGACE) 20 MG tablet Take 2 tablets (40 mg total) by mouth 2 (two) times daily. Patient not taking: Reported on 12/17/2015 06/28/15   Luvenia Redden, PA-C  ondansetron (ZOFRAN ODT) 4 MG disintegrating tablet Take 1 tablet (4 mg total) by mouth every 8 (eight) hours as needed for nausea. Patient not taking: Reported on 07/07/2016 12/17/15   Larene Pickett, PA-C  ondansetron (ZOFRAN ODT) 4 MG disintegrating tablet Take 1 tablet (4 mg total) by mouth every 8 (eight) hours as needed for nausea or vomiting. Patient not taking: Reported on 07/07/2016 06/08/16   Tresea Mall, CNM    Family History Family History  Problem Relation Age  of Onset  . Cancer Maternal Grandmother   . Cancer Paternal Grandmother   . Asthma Mother   . Multiple sclerosis Sister   . Anesthesia problems Neg Hx   . Hypotension Neg Hx   . Malignant hyperthermia Neg Hx   . Pseudochol deficiency Neg Hx     Social History Social History  Substance Use Topics  . Smoking status: Former Smoker    Packs/day: 0.50    Types: Cigarettes    Quit date: 11/21/2015  . Smokeless tobacco: Never Used     Comment: unable to smoke last 2 wks  . Alcohol use No     Comment: occ     Allergies   Patient has no known allergies.   Review of Systems Review of Systems    Respiratory: Positive for shortness of breath.   Gastrointestinal: Positive for abdominal distention, abdominal pain, nausea and vomiting.  Musculoskeletal: Positive for back pain.  Neurological: Positive for headaches.  Psychiatric/Behavioral: Positive for sleep disturbance.    A complete 10 system review of systems was obtained and all systems are negative except as noted in the HPI and PMH.    Physical Exam Updated Vital Signs BP 106/70 (BP Location: Left Arm)   Pulse 91   Temp 98.3 F (36.8 C) (Oral)   Resp 18   Ht 5\' 4"  (1.626 m)   Wt 115 lb (52.2 kg)   LMP 04/15/2016   SpO2 100%   BMI 19.74 kg/m   Physical Exam  Constitutional: She is oriented to person, place, and time. She appears well-developed and well-nourished. No distress.  HENT:  Head: Normocephalic and atraumatic.  Eyes: Conjunctivae are normal. No scleral icterus.  Neck: Normal range of motion. Neck supple.  Cardiovascular: Normal rate, regular rhythm and normal heart sounds.  Exam reveals no gallop and no friction rub.   No murmur heard. Pulmonary/Chest: Effort normal and breath sounds normal. No respiratory distress.  Abdominal: Soft. Bowel sounds are normal. She exhibits distension. She exhibits no mass. There is no tenderness. There is no guarding.  Large, tight, distended abdomen with positive fluid wave, nontender.  Musculoskeletal: She exhibits no edema.  Neurological: She is alert and oriented to person, place, and time.  Skin: Skin is warm and dry. She is not diaphoretic.  Psychiatric: She has a normal mood and affect.  Nursing note and vitals reviewed.    ED Treatments / Results   DIAGNOSTIC STUDIES: Oxygen Saturation is 100% on RA, normal by my interpretation.   COORDINATION OF CARE: 9:50 PM-Discussed next steps with pt. Pt verbalized understanding and is agreeable with the plan.    Labs (all labs ordered are listed, but only abnormal results are displayed) Labs Reviewed - No data  to display  EKG  EKG Interpretation None       Radiology No results found.  Procedures Procedures (including critical care time)  Medications Ordered in ED Medications - No data to display   Initial Impression / Assessment and Plan / ED Course  I have reviewed the triage vital signs and the nursing notes.  Pertinent labs & imaging results that were available during my care of the patient were reviewed by me and considered in my medical decision making (see chart for details).     Patient with pregnancy, abnormal pelvic mass and large volume ascites. Unable to obtain an MRI at this hour. I have spoken with Dr. Maryclare Bean and have ordered outpatient IR paracentesis for tomorrow. Patient with headache, this is  chronic and no concern for subarachnoid hemorrhage, vasculitis, meningitis. Headache treated and improved in the ER. The patient will be discharged to follow up for paracentesis tomorrow. Labs drawn. I also discussed the case with Dr. Ilda Basset of the faculty practice, who will follow up on lab results from the patient's paracentesis fluid analysis. Discussed return precautions with the patient. The patient appears safe for discharge at this time  Final Clinical Impressions(s) / ED Diagnoses   Final diagnoses:  Other ascites    New Prescriptions New Prescriptions   No medications on file   I personally performed the services described in this documentation, which was scribed in my presence. The recorded information has been reviewed and is accurate.      Margarita Mail, PA-C 07/08/16 [place], MD 07/08/16 [phone]

## 2016-07-07 NOTE — ED Triage Notes (Signed)
Pt is scheduled for an MRI evaluation of ascites and a mass on her ovary on Monday 2/5 but she states she cannot make it due to the pain. Pt is also 3 months pregnant. Alert and oriented.

## 2016-07-08 ENCOUNTER — Ambulatory Visit (HOSPITAL_COMMUNITY)
Admission: RE | Admit: 2016-07-08 | Discharge: 2016-07-08 | Disposition: A | Discharge status: Home / Self Care | Payer: Medicaid Other | Source: Ambulatory Visit | Attending: Emergency Medicine | Admitting: Emergency Medicine

## 2016-07-08 DIAGNOSIS — R896 Abnormal cytological findings in specimens from other organs, systems and tissues: Secondary | ICD-10-CM | POA: Diagnosis not present

## 2016-07-08 DIAGNOSIS — R188 Other ascites: Secondary | ICD-10-CM | POA: Insufficient documentation

## 2016-07-08 LAB — LACTATE DEHYDROGENASE, PLEURAL OR PERITONEAL FLUID: LD FL: 87 U/L — AB (ref 3–23)

## 2016-07-08 LAB — BODY FLUID CELL COUNT WITH DIFFERENTIAL
EOS FL: 0 %
Lymphs, Fluid: 11 %
MONOCYTE-MACROPHAGE-SEROUS FLUID: 22 % — AB (ref 50–90)
Neutrophil Count, Fluid: 67 % — ABNORMAL HIGH (ref 0–25)
Total Nucleated Cell Count, Fluid: 479 cu mm (ref 0–1000)

## 2016-07-08 LAB — ALBUMIN, FLUID (OTHER): Albumin, Fluid: 2.2 g/dL

## 2016-07-08 LAB — PROTEIN, BODY FLUID: TOTAL PROTEIN, FLUID: 3.7 g/dL

## 2016-07-08 NOTE — Procedures (Signed)
Ultrasound-guided diagnostic and therapeutic paracentesis performed yielding 2.7 liters of yellow fluid. No immediate complications. A portion of the fluid was submitted to the lab for preordered studies.

## 2016-07-08 NOTE — ED Notes (Signed)
Patient was alert, oriented and stable upon discharge. RN went over AVS and patient had no further questions.  

## 2016-07-08 NOTE — Discharge Instructions (Signed)
If you have not heard from the Interventional radiology department by mid morning tomorrow, please call [phone]  Get help right away if: You develop a fever. You develop confusion. You develop new or worsening difficulty breathing. You develop new or worsening abdominal pain. You develop new or worsening swelling in the scrotum (in men).

## 2016-07-09 ENCOUNTER — Ambulatory Visit (HOSPITAL_COMMUNITY): Payer: Medicaid Other

## 2016-07-09 HISTORY — PX: UMBILICAL HERNIA REPAIR: SHX196

## 2016-07-09 HISTORY — PX: RIGHT OOPHORECTOMY: SHX2359

## 2016-07-09 HISTORY — PX: TUMOR REMOVAL: SHX12

## 2016-07-09 LAB — PH, BODY FLUID: PH, BODY FLUID: 7.7

## 2016-07-09 LAB — MISC LABCORP TEST (SEND OUT): Labcorp test code: 88062

## 2016-07-09 LAB — GRAM STAIN

## 2016-07-12 ENCOUNTER — Encounter (HOSPITAL_COMMUNITY): Payer: Self-pay | Admitting: *Deleted

## 2016-07-12 ENCOUNTER — Inpatient Hospital Stay (HOSPITAL_COMMUNITY)
Admission: AD | Admit: 2016-07-12 | Discharge: 2016-07-12 | Disposition: A | Discharge status: Home / Self Care | Payer: Medicaid Other | Source: Ambulatory Visit | Attending: Obstetrics & Gynecology | Admitting: Obstetrics & Gynecology

## 2016-07-12 DIAGNOSIS — Z87891 Personal history of nicotine dependence: Secondary | ICD-10-CM | POA: Insufficient documentation

## 2016-07-12 DIAGNOSIS — O99355 Diseases of the nervous system complicating the puerperium: Secondary | ICD-10-CM | POA: Diagnosis not present

## 2016-07-12 DIAGNOSIS — R188 Other ascites: Secondary | ICD-10-CM | POA: Diagnosis not present

## 2016-07-12 DIAGNOSIS — R109 Unspecified abdominal pain: Secondary | ICD-10-CM | POA: Diagnosis present

## 2016-07-12 DIAGNOSIS — Z3A12 12 weeks gestation of pregnancy: Secondary | ICD-10-CM | POA: Diagnosis not present

## 2016-07-12 DIAGNOSIS — O26891 Other specified pregnancy related conditions, first trimester: Secondary | ICD-10-CM | POA: Insufficient documentation

## 2016-07-12 DIAGNOSIS — R1900 Intra-abdominal and pelvic swelling, mass and lump, unspecified site: Secondary | ICD-10-CM

## 2016-07-12 HISTORY — DX: Other ascites: R18.8

## 2016-07-12 MED ORDER — OXYCODONE-ACETAMINOPHEN 5-325 MG PO TABS: 2.0000 | ORAL_TABLET | ORAL | 0 refills | Status: DC | PRN

## 2016-07-12 MED ORDER — OXYCODONE-ACETAMINOPHEN 5-325 MG PO TABS
2.0000 | ORAL_TABLET | ORAL | Status: DC | PRN
  Administered 2016-07-12: 2 via ORAL
  Filled 2016-07-12: qty 2

## 2016-07-12 NOTE — MAU Note (Signed)
Pt came in by EMS, states she had something that felt like a pop in her abdomen, then started having very sharp constant mid upper abdomen. States pain is unbearable.  Pt recently had paracentesis @ [facility].  Has MRI scheduled for tomorrow.  Vomited once today.

## 2016-07-12 NOTE — Discharge Instructions (Signed)
Ascites  Ascites is a collection of excess fluid in the abdomen. Ascites can range from mild to severe. It can get worse without treatment.  What are the causes?  Possible causes include:  · Cirrhosis. This is the most common cause of ascites.  · Infection or inflammation in the abdomen.  · Cancer in the abdomen.  · Heart failure.  · Kidney disease.  · Inflammation of the pancreas.  · Clots in the veins of the liver.    What are the signs or symptoms?  Signs and symptoms may include:  · A feeling of fullness in your abdomen. This is common.  · An increase in the size of your abdomen or your waist.  · Swelling in your legs.  · Swelling of the scrotum in men.  · Difficulty breathing.  · Abdominal pain.  · Sudden weight gain.    If the condition is mild, you may not have symptoms.  How is this diagnosed?  To make a diagnosis, your health care provider will:  · Ask about your medical history.  · Perform a physical exam.  · Order imaging tests, such as an ultrasound or CT scan of your abdomen.    How is this treated?  Treatment depends on the cause of the ascites. It may include:  · Taking a pill to make you urinate. This is called a water pill (diuretic pill).  · Strictly reducing your salt (sodium) intake. Salt can cause extra fluid to be kept in the body, and this makes ascites worse.  · Having a procedure to remove fluid from your abdomen (paracentesis).  · Having a procedure to transfer fluid from your abdomen into a vein.  · Having a procedure that connects two of the major veins within your liver and relieves pressure on your liver (TIPS procedure).    Ascites may go away or improve with treatment of the condition that caused it.  Follow these instructions at home:  · Keep track of your weight. To do this, weigh yourself at the same time every day and record your weight.  · Keep track of how much you drink and any changes in the amount you urinate.  · Follow any instructions that your health care provider gives  you about how much to drink.  · Try not to eat salty (high-sodium) foods.  · Take medicines only as directed by your health care provider.  · Keep all follow-up visits as directed by your health care provider. This is important.  · Report any changes in your health to your health care provider, especially if you develop new symptoms or your symptoms get worse.  Contact a health care provider if:  · Your gain more than 3 pounds in 3 days.  · Your abdominal size or your waist size increases.  · You have new swelling in your legs.  · The swelling in your legs gets worse.  Get help right away if:  · You develop a fever.  · You develop confusion.  · You develop new or worsening difficulty breathing.  · You develop new or worsening abdominal pain.  · You develop new or worsening swelling in the scrotum (in men).  This information is not intended to replace advice given to you by your health care provider. Make sure you discuss any questions you have with your health care provider.  Document Released: 05/25/2005 Document Revised: 10/02/2015 Document Reviewed: 12/22/2013  Elsevier Interactive Patient Education © 2017 [employer]

## 2016-07-12 NOTE — MAU Provider Note (Signed)
Patient Veronica Green a 36 year old [id number] at 12 weeks and 4 days here with complaints of abdominal swelling that started on Wednesday. She was seen in the [facility] ED and had 2.7 liters of fluid drained off her abdomen.  Her history is significant for ovarian abscess and she is scheduled to see Dr. Denman George tomorrow.  She denies any bleeding or unusual vaginal discharge.  History     CSN: [id number]  Arrival date and time: 07/12/16 1206   None     Chief Complaint  Patient presents with  . Abdominal Pain   Abdominal Pain  This is a recurrent problem. The current episode started in the past 7 days. The onset quality is gradual. The problem occurs constantly. The problem has been gradually worsening. The pain is located in the periumbilical region and generalized abdominal region. The pain is at a severity of 8/10. The pain is severe. The quality of the pain is aching. The abdominal pain does not radiate. Pertinent negatives include no anorexia, arthralgias, belching, constipation, diarrhea, dysuria, fever, flatus, frequency, headaches, hematochezia, hematuria, melena, myalgias, nausea, vomiting or weight loss. Nothing aggravates the pain. The pain is relieved by nothing. She has tried nothing for the symptoms.    OB History    Gravida Para Term Preterm AB Living   6 4 4  0 1 4   SAB TAB Ectopic Multiple Live Births   0 1 0 0 4      Past Medical History:  Diagnosis Date  . Anxiety   . Ascites   . Sickle cell trait ([place])   . Umbilical hernia     Past Surgical History:  Procedure Laterality Date  . DILATION AND CURETTAGE OF UTERUS      Family History  Problem Relation Age of Onset  . Cancer Maternal Grandmother   . Cancer Paternal Grandmother   . Asthma Mother   . Multiple sclerosis Sister   . Anesthesia problems Neg Hx   . Hypotension Neg Hx   . Malignant hyperthermia Neg Hx   . Pseudochol deficiency Neg Hx     Social History  Substance Use Topics  . Smoking status: Former  Smoker    Packs/day: 0.50    Types: Cigarettes    Quit date: 11/21/2015  . Smokeless tobacco: Never Used     Comment: unable to smoke last 2 wks  . Alcohol use No     Comment: occ    Allergies: No Known Allergies  Prescriptions Prior to Admission  Medication Sig Dispense Refill Last Dose  . Doxylamine-Pyridoxine (DICLEGIS) 10-10 MG TBEC Take 2 tablets by mouth at bedtime. If symptoms persist add one tablet every morning starting on day 3. If symptoms persist add 1 tablet every afternoon on day 4. (Patient not taking: Reported on 07/07/2016) 60 tablet 3 Not Taking at Unknown time  . ibuprofen (ADVIL,MOTRIN) 800 MG tablet Take 1 tablet (800 mg total) by mouth 3 (three) times daily. (Patient not taking: Reported on 07/12/2016) 21 tablet 0 Not Taking at Unknown time  . megestrol (MEGACE) 20 MG tablet Take 2 tablets (40 mg total) by mouth 2 (two) times daily. (Patient not taking: Reported on 12/17/2015) 160 tablet 0 Not Taking at Unknown time  . ondansetron (ZOFRAN ODT) 4 MG disintegrating tablet Take 1 tablet (4 mg total) by mouth every 8 (eight) hours as needed for nausea. (Patient not taking: Reported on 07/07/2016) 10 tablet 0 Not Taking at Unknown time  . ondansetron (ZOFRAN ODT) 4 MG  disintegrating tablet Take 1 tablet (4 mg total) by mouth every 8 (eight) hours as needed for nausea or vomiting. (Patient not taking: Reported on 07/07/2016) 20 tablet 1 Not Taking at Unknown time    Review of Systems  Constitutional: Negative for fever and weight loss.  HENT: Negative.   Eyes: Negative.   Respiratory: Negative.   Gastrointestinal: Positive for abdominal pain. Negative for anorexia, constipation, diarrhea, flatus, hematochezia, melena, nausea and vomiting.  Endocrine: Negative.   Genitourinary: Negative.  Negative for dysuria, frequency and hematuria.  Musculoskeletal: Negative for arthralgias and myalgias.  Allergic/Immunologic: Negative.   Neurological: Negative.  Negative for headaches.   Hematological: Negative.   Psychiatric/Behavioral: Negative.    Physical Exam   Blood pressure 108/79, pulse 88, temperature 97.6 F (36.4 C), temperature source Oral, resp. rate 19, height 5\' 4"  (1.626 m), weight 110 lb (49.9 kg), last menstrual period 04/15/2016, SpO2 100 %, unknown if currently breastfeeding.  Physical Exam  Constitutional: She is oriented to person, place, and time. She appears well-developed and well-nourished.  HENT:  Head: Normocephalic.  Neck: Normal range of motion.  Respiratory: Effort normal. No respiratory distress. She has no wheezes. She has no rales.  GI: She exhibits distension. She exhibits no mass. There is tenderness. There is no rebound and no guarding.  Musculoskeletal: Normal range of motion.  Neurological: She is alert and oriented to person, place, and time.  Skin: Skin is warm and dry.    MAU Course  Procedures  MDM -FHR is 165 -percocet for pain; patient feels that her pain is now a 6/10 after two percocets.  -Dr. Elonda Husky at the bedside; discussed with patient that we can offer her pain relief and an RX for pain, or she can transfer to [facility] to see the on-call gyn-onc MD. Patient has appointment with Dr. Denman George tomorrow.   Assessment and Plan   1. Other ascites     2. Patient to be discharged home with RX for percocet to hold off her pain until her appointment with Dr. Denman George tomorrow. Informed patient to return to the MAU or go to the [facility] ED if the patient experiences severely worsening abdominal pain or she has any vaginal bleeding.   Mervyn Skeeters Lonzell Dorris CNM 07/12/2016, 1:29 PM

## 2016-07-12 NOTE — MAU Note (Signed)
Dr. Elonda Husky in to see pt.

## 2016-07-13 ENCOUNTER — Ambulatory Visit (HOSPITAL_COMMUNITY)
Admission: RE | Admit: 2016-07-13 | Discharge: 2016-07-13 | Disposition: A | Discharge status: Home / Self Care | Payer: Medicaid Other | Source: Ambulatory Visit | Attending: Gynecologic Oncology | Admitting: Gynecologic Oncology

## 2016-07-13 ENCOUNTER — Telehealth: Payer: Self-pay

## 2016-07-13 ENCOUNTER — Ambulatory Visit: Payer: Medicaid Other | Attending: Gynecologic Oncology | Admitting: Gynecologic Oncology

## 2016-07-13 ENCOUNTER — Encounter: Payer: Self-pay | Admitting: Gynecologic Oncology

## 2016-07-13 ENCOUNTER — Other Ambulatory Visit (HOSPITAL_BASED_OUTPATIENT_CLINIC_OR_DEPARTMENT_OTHER): Payer: Medicaid Other

## 2016-07-13 VITALS — BP 109/76 | HR 79 | Temp 98.6°F | Resp 18 | Ht 64.0 in | Wt 111.7 lb

## 2016-07-13 DIAGNOSIS — O2511 Malnutrition in pregnancy, first trimester: Secondary | ICD-10-CM | POA: Diagnosis not present

## 2016-07-13 DIAGNOSIS — R188 Other ascites: Secondary | ICD-10-CM | POA: Insufficient documentation

## 2016-07-13 DIAGNOSIS — Z820 Family history of epilepsy and other diseases of the nervous system: Secondary | ICD-10-CM | POA: Diagnosis not present

## 2016-07-13 DIAGNOSIS — Z825 Family history of asthma and other chronic lower respiratory diseases: Secondary | ICD-10-CM | POA: Insufficient documentation

## 2016-07-13 DIAGNOSIS — N839 Noninflammatory disorder of ovary, fallopian tube and broad ligament, unspecified: Secondary | ICD-10-CM | POA: Insufficient documentation

## 2016-07-13 DIAGNOSIS — R1900 Intra-abdominal and pelvic swelling, mass and lump, unspecified site: Secondary | ICD-10-CM

## 2016-07-13 DIAGNOSIS — O99611 Diseases of the digestive system complicating pregnancy, first trimester: Secondary | ICD-10-CM | POA: Insufficient documentation

## 2016-07-13 DIAGNOSIS — Z3A12 12 weeks gestation of pregnancy: Secondary | ICD-10-CM | POA: Insufficient documentation

## 2016-07-13 DIAGNOSIS — Z809 Family history of malignant neoplasm, unspecified: Secondary | ICD-10-CM | POA: Diagnosis not present

## 2016-07-13 DIAGNOSIS — E43 Unspecified severe protein-calorie malnutrition: Secondary | ICD-10-CM | POA: Diagnosis not present

## 2016-07-13 DIAGNOSIS — Z3490 Encounter for supervision of normal pregnancy, unspecified, unspecified trimester: Secondary | ICD-10-CM | POA: Insufficient documentation

## 2016-07-13 DIAGNOSIS — Z87891 Personal history of nicotine dependence: Secondary | ICD-10-CM | POA: Diagnosis not present

## 2016-07-13 DIAGNOSIS — D573 Sickle-cell trait: Secondary | ICD-10-CM | POA: Insufficient documentation

## 2016-07-13 LAB — LACTATE DEHYDROGENASE: LDH: 139 U/L (ref 125–245)

## 2016-07-13 LAB — TOTAL BILIRUBIN, BODY FLUID: TOTBILIFLUID: 0.2 mg/dL

## 2016-07-13 LAB — CULTURE, BODY FLUID W GRAM STAIN -BOTTLE: Culture: NO GROWTH

## 2016-07-13 LAB — CEA (IN HOUSE-CHCC): CEA (CHCC-In House): 1.45 ng/mL (ref 0.00–5.00)

## 2016-07-13 LAB — CULTURE, BODY FLUID-BOTTLE

## 2016-07-13 NOTE — Addendum Note (Signed)
Addended by: Joylene John D on: 07/13/2016 01:56 PM   Modules accepted: Orders

## 2016-07-13 NOTE — Telephone Encounter (Signed)
[facility] MR# [id number]

## 2016-07-13 NOTE — Telephone Encounter (Signed)
Paracentesis today.  MRI of abd/pelvis  Thursday 07/16/16 at 5:00pm at [facility].  Friday 07/17/16 at 11:45 @ [facility], is pt's preoperative appointment and to meet Dr Janie Morning and Surgical date is 07/22/16 w/ Dr Janie Morning at [facility].

## 2016-07-13 NOTE — Procedures (Signed)
Ultrasound-guided therapeutic paracentesis performed yielding 2.4 liters of yellow colored fluid. No immediate complications.  Veronica Green E 2:00 PM 07/13/2016

## 2016-07-13 NOTE — Progress Notes (Signed)
Consult Note: Gyn-Onc  Consult was requested by Dr. Elly Modena for the evaluation of Veronica Green 36 y.o. female  CC:  Chief Complaint  Patient presents with  . pelvic mass and ascites    Assessment/Plan:  Veronica Green  is a 36 y.o.  year old who is 12 weeks and 4 days GA ([facility] 01/20/17) who has a complex 12cm ovarian mass and large volume ascites.  Paracentesis on 07/08/16 was benign. However, I am concerned about an underlying malignancy.   I am recommending surgical exploration with exploratory laparotomy, possible unilateral salpingo-oophorectomy, possible staging or debulking. The pregnancy is desired and therefore hysterectomy will not be performed, even in the event of pregnancy. I discussed that if the mass was unresectable, a biopsy would be taken and the likely treatment approach would be chemotherapy until delivery.  Given the complexity of her care, anticipated laparotomy in pregnancy and lack of obstetric services here at [facility] I believe she should have surgery scheduled at [facility] with one of my partners. We will facilitate that scheduling.  She has evidence of severe protein wasting malnutrition (Albumin 2.8) which may be secondary to nausea and emesis in pregnancy and anorexia and early satiety from the ascites and mass. I counseled Veronica Green about the importance of optimizing nutrition prior to a surgery.  We will obtain tumor markers today and have scheduled an MRI of the abdomen and pelvis to better characterize the mass and abdominal pathology.  We will schedule her for another therapeutic paracentesis today to alleviate the abdominal pressure.  HPI: Veronica Green is a 36 year old para 4 who is 12 weeks and 4 days GA ([facility] 01/20/17) is seen in consultation at the request of Dr Elly Modena for a 12cm complex pelvic mass in the setting of large volume ascites and severe malnutrition.   The patient first experienced an appreciable lump at her umbilicus (umbilical  hernia) in July, 2017. Throughout the fall of 2017 her abdomen began increasing in size.  On January 1st 2018 she began having nausea and emesis and distension of her abdomen. A urine pregnancy test was taken in the ED which was positive. In the ED notes there is no report of an [place] being performed to confirm IUP.  On January 27th 2018 she represented to the ED with headache and progressive severe distension of her abdomen.  [place] at that time conifrmed an IUP  With an [place] of 01/21/17. A large heterogeneously partially solid and partially cystic mass was seen within the pelvis extending into the lower abdomen measuring 12.2x12x13.5cm. It appeared to be separate from the uterus. There was peripheral vascularity evident within the lesion. There was also large volume ascites.   Abdominal paracentesis was scheduled and performed on 07/08/16 and revealed only mixed inflammatory cells. Albumin was 2.8, cultures were negative.  Her labs showed severe protein wasting malnutrition with an albumin of 2.8. She has normal/low LFT's.   Current Meds:  Outpatient Encounter Prescriptions as of 07/13/2016  Medication Sig  . oxyCODONE-acetaminophen (PERCOCET/ROXICET) 5-325 MG tablet Take 2 tablets by mouth every 3 (three) hours as needed for severe pain.  . Doxylamine-Pyridoxine (DICLEGIS) 10-10 MG TBEC Take 2 tablets by mouth at bedtime. If symptoms persist add one tablet every morning starting on day 3. If symptoms persist add 1 tablet every afternoon on day 4. (Patient not taking: Reported on 07/13/2016)   No facility-administered encounter medications on file as of 07/13/2016.     Allergy: No Known Allergies  Social Hx:   Social History   Social History  . Marital status: Single    Spouse name: N/A  . Number of children: N/A  . Years of education: N/A   Occupational History  . Not on file.   Social History Main Topics  . Smoking status: Former Smoker    Packs/day: 0.50    Types: Cigarettes    Quit date:  11/21/2015  . Smokeless tobacco: Never Used     Comment: unable to smoke last 2 wks  . Alcohol use No     Comment: occ  . Drug use: No  . Sexual activity: Yes    Birth control/ protection: None   Other Topics Concern  . Not on file   Social History Narrative  . No narrative on file    Past Surgical Hx:  Past Surgical History:  Procedure Laterality Date  . DILATION AND CURETTAGE OF UTERUS      Past Medical Hx:  Past Medical History:  Diagnosis Date  . Anxiety   . Ascites   . Sickle cell trait ([place])   . Umbilical hernia     Past Gynecological History:  SVD x 4 Patient's last menstrual period was 04/15/2016.  Family Hx:  Family History  Problem Relation Age of Onset  . Cancer Maternal Grandmother   . Cancer Paternal Grandmother   . Asthma Mother   . Multiple sclerosis Sister   . Anesthesia problems Neg Hx   . Hypotension Neg Hx   . Malignant hyperthermia Neg Hx   . Pseudochol deficiency Neg Hx     Review of Systems:  Constitutional  Feels nauseated, fatigued  ENT Normal appearing ears and nares bilaterally Skin/Breast  No rash, sores, jaundice, itching, dryness Cardiovascular  No chest pain, shortness of breath, or edema  Pulmonary  No cough or wheeze.  Gastro Intestinal  + anorexia and nausea and emesis. + abdominal distension  Genito Urinary  No frequency, urgency, dysuria, no bleeding Musculo Skeletal  No myalgia, arthralgia, joint swelling or pain  Neurologic  No weakness, numbness, change in gait,  Psychology  No depression, anxiety, insomnia.   Vitals:  Blood pressure 109/76, pulse 79, temperature 98.6 F (37 C), temperature source Oral, resp. rate 18, height 5\' 4"  (1.626 m), weight 111 lb 11.2 oz (50.7 kg), last menstrual period 04/15/2016, SpO2 100 %, unknown if currently breastfeeding.  Physical Exam: WD in NAD thin with sunken cheeks Neck  Supple NROM, without any enlargements.  Lymph Node Survey No cervical supraclavicular or  inguinal adenopathy Cardiovascular  Pulse normal rate, regularity and rhythm. S1 and S2 normal.  Lungs  Clear to auscultation bilateraly, without wheezes/crackles/rhonchi. Good air movement.  Skin  No rash/lesions/breakdown  Psychiatry  Alert and oriented to person, place, and time  Abdomen  Normoactive bowel sounds, abdomen soft but distended with a fluid wave present, non-tender and nonobese with protruding umbilical hernia (reducible) Back No CVA tenderness Genito Urinary  Vulva/vagina: Normal external female genitalia.   No lesions. No discharge or bleeding.  Bladder/urethra:  No lesions or masses, well supported bladder  Vagina: normal  Cervix: Normal appearing, no lesions.  Uterus: enlarged, difficult to discern what is pelvic mass and what is gravid pelvic uterus  Adnexa: difficult to discern discrete pelvic masses other than gravid uterus. Rectal  Good tone, no masses no cul de sac nodularity.  Extremities  No bilateral cyanosis, clubbing or edema.   Donaciano Eva, MD  07/13/2016, 12:17 PM

## 2016-07-13 NOTE — Patient Instructions (Addendum)
MRI schduled for Thursday 07/16/16 at 5:00 PM, please arrrive at 4:45 and DO NOT HAVE ANYTHING TO EAT 4 HOURS before the test.  Friday 07/17/16 at 11:45  @ [place], [facility] for your preoperative appointment and to meet Dr Janie Morning. Wednesday 07/22/16 will be your surgical date with Dr Janie Morning.     Magnetic Resonance Imaging Magnetic resonance imaging (MRI) is a painless test that takes pictures of the inside of your body. This test uses a strong magnet. This test does not use X-rays or radiation. BEFORE THE PROCEDURE  You will be asked to take off all metal. This includes:  Your watch, jewelry, and other metal items.  Some makeup may have very small bits of metal and may need to be taken off.  Braces and fillings normally are not a problem. PROCEDURE  You may be given earplugs or headphones to listen to music. The machine can be noisy.  You might get a shot (injection) with a dye (contrast material) to help the MRI take better pictures.  MRI is done in a tunnel-shaped scanner. You will lie on a table that slides into the tunnel-shaped scanner. Once inside, you will still be able to talk to the person doing the test.  You will be asked to hold very still. You will be told when you can shift position. You may have to wait a few minutes to make sure the images are readable. AFTER THE PROCEDURE  You may go back to your normal activities right away.  If you got a shot of dye, it will pass naturally through your body within a day.  Your doctor will talk to you about the results. This information is not intended to replace advice given to you by your health care provider. Make sure you discuss any questions you have with your health care provider. Document Released: 06/27/2010 Document Revised: 06/15/2014 Document Reviewed: 07/20/2013 Elsevier Interactive Patient Education  2017 [employer].

## 2016-07-14 ENCOUNTER — Encounter: Payer: Self-pay | Admitting: Obstetrics and Gynecology

## 2016-07-14 ENCOUNTER — Ambulatory Visit (INDEPENDENT_AMBULATORY_CARE_PROVIDER_SITE_OTHER): Payer: Medicaid Other | Admitting: Obstetrics and Gynecology

## 2016-07-14 ENCOUNTER — Telehealth: Payer: Self-pay

## 2016-07-14 ENCOUNTER — Other Ambulatory Visit (HOSPITAL_COMMUNITY)
Admission: RE | Admit: 2016-07-14 | Discharge: 2016-07-14 | Disposition: A | Discharge status: Home / Self Care | Payer: Medicaid Other | Source: Ambulatory Visit | Attending: Obstetrics and Gynecology | Admitting: Obstetrics and Gynecology

## 2016-07-14 VITALS — BP 100/65 | HR 93 | Temp 98.1°F | Wt 109.4 lb

## 2016-07-14 DIAGNOSIS — Z113 Encounter for screening for infections with a predominantly sexual mode of transmission: Secondary | ICD-10-CM | POA: Diagnosis present

## 2016-07-14 DIAGNOSIS — Z1151 Encounter for screening for human papillomavirus (HPV): Secondary | ICD-10-CM | POA: Insufficient documentation

## 2016-07-14 DIAGNOSIS — Z01419 Encounter for gynecological examination (general) (routine) without abnormal findings: Secondary | ICD-10-CM | POA: Diagnosis present

## 2016-07-14 DIAGNOSIS — R1900 Intra-abdominal and pelvic swelling, mass and lump, unspecified site: Secondary | ICD-10-CM

## 2016-07-14 DIAGNOSIS — O0991 Supervision of high risk pregnancy, unspecified, first trimester: Secondary | ICD-10-CM | POA: Diagnosis not present

## 2016-07-14 LAB — CA 125

## 2016-07-14 MED ORDER — PREPLUS 27-1 MG PO TABS: 1.0000 | ORAL_TABLET | Freq: Every day | ORAL | 13 refills | Status: DC

## 2016-07-14 NOTE — Telephone Encounter (Signed)
-----   Message from Gaspar Bidding sent at 07/13/2016  2:25 PM EST ----- Regarding: RE: preauth for  MRI  ASAP Referral authorized ----- Message ----- From: Daralene Milch, RN Sent: 07/13/2016  12:55 PM To: Gaspar Bidding Subject: preauth for  MRI  ASAP                         Need pre auth for MRI date 07/16/16 Thursday at 5:00pm For Pelvic mass, pt is pregnant.

## 2016-07-14 NOTE — Progress Notes (Signed)
Subjective:  Veronica Green is a 36 y.o. [id number] at [redacted]w[redacted]d being seen today for first prenatal visit.  Her prenatal care has been complicated by a pelvic mass with ascites. She has had paracentesis completed with benign fluid pathology. She has seen Dr. Denman George and is schedule for MRI this Thursday and preop consult with Dr Skeet Latch at [facility]. She is schedule for Exp Lap on 07/22/16 at [facility]. She is currently monitored for the following issues for this high-risk pregnancy and has Pelvic mass in female on her problem list.  Patient reports nausea and vomiting.  Contractions: Not present. Vag. Bleeding: None.   . Denies leaking of fluid.   The following portions of the patient's history were reviewed and updated as appropriate: allergies, current medications, past family history, past medical history, past social history, past surgical history and problem list. Problem list updated.  Objective:   Vitals:   07/14/16 0927  BP: 100/65  Pulse: 93  Temp: 98.1 F (36.7 C)  Weight: 109 lb 6.4 oz (49.6 kg)    Fetal Status: Fetal Heart Rate (bpm): 150         General:  Alert, oriented and cooperative. Patient is in no acute distress.  Skin: Skin is warm and dry. No rash noted.   Cardiovascular: Normal heart rate noted  Respiratory: Normal respiratory effort, no problems with respiration noted  Abdomen: Soft, gravid, appropriate for gestational age. Pain/Pressure: Absent   Distended  Pelvic:  Cervical exam performed        Extremities: Normal range of motion.  Edema: None  Mental Status: Normal mood and affect. Normal behavior. Normal judgment and thought content.  Breast Sym supple no masses nipple d/c or adenopathy  Urinalysis:      Assessment and Plan:  Pregnancy: [id number] at [redacted]w[redacted]d  1. Supervision of high risk pregnancy in first trimester Start PNV. [place] form for Ensure to assist with nutritional status  Pt desires pregnancy at this time but understands surgery will provide [place] more information to  allow [place] to discuss potential effects on pregnancy. Further discussion to be held after surgery and pathology returns - Cytology - PAP - Obstetric Panel, Including HIV - Culture, OB Urine - ToxASSURE Select 13 (MW), Urine - GC/Chlamydia probe amp ([place])not at [facility] - Hemoglobinopathy evaluation - Varicella zoster antibody, IgG - Prenatal Vit-Fe Fumarate-FA (PREPLUS) 27-1 MG TABS; Take 1 tablet by mouth daily.  Dispense: 30 tablet; Refill: 13  2. Pelvic mass in female As noted above  Preterm labor symptoms and general obstetric precautions including but not limited to vaginal bleeding, contractions, leaking of fluid and fetal movement were reviewed in detail with the patient. Please refer to After Visit Summary for other counseling recommendations.  Return in about 4 weeks (around 08/11/2016) for OB visit.   [name], MD

## 2016-07-14 NOTE — Patient Instructions (Signed)
First Trimester of Pregnancy The first trimester of pregnancy is from week 1 until the end of week 12 (months 1 through 3). A week after a sperm fertilizes an egg, the egg will implant on the wall of the uterus. This embryo will begin to develop into a baby. Genes from you and your partner are forming the baby. The female genes determine whether the baby is a boy or a girl. At 6-8 weeks, the eyes and face are formed, and the heartbeat can be seen on ultrasound. At the end of 12 weeks, all the baby's organs are formed.  Now that you are pregnant, you will want to do everything you can to have a healthy baby. Two of the most important things are to get good prenatal care and to follow your health care provider's instructions. Prenatal care is all the medical care you receive before the baby's birth. This care will help prevent, find, and treat any problems during the pregnancy and childbirth. BODY CHANGES Your body goes through many changes during pregnancy. The changes vary from woman to woman.   You may gain or lose a couple of pounds at first.  You may feel sick to your stomach (nauseous) and throw up (vomit). If the vomiting is uncontrollable, call your health care provider.  You may tire easily.  You may develop headaches that can be relieved by medicines approved by your health care provider.  You may urinate more often. Painful urination may mean you have a bladder infection.  You may develop heartburn as a result of your pregnancy.  You may develop constipation because certain hormones are causing the muscles that push waste through your intestines to slow down.  You may develop hemorrhoids or swollen, bulging veins (varicose veins).  Your breasts may begin to grow larger and become tender. Your nipples may stick out more, and the tissue that surrounds them (areola) may become darker.  Your gums may bleed and may be sensitive to brushing and flossing.  Dark spots or blotches (chloasma,  mask of pregnancy) may develop on your face. This will likely fade after the baby is born.  Your menstrual periods will stop.  You may have a loss of appetite.  You may develop cravings for certain kinds of food.  You may have changes in your emotions from day to day, such as being excited to be pregnant or being concerned that something may go wrong with the pregnancy and baby.  You may have more vivid and strange dreams.  You may have changes in your hair. These can include thickening of your hair, rapid growth, and changes in texture. Some women also have hair loss during or after pregnancy, or hair that feels dry or thin. Your hair will most likely return to normal after your baby is born. WHAT TO EXPECT AT YOUR PRENATAL VISITS During a routine prenatal visit:  You will be weighed to make sure you and the baby are growing normally.  Your blood pressure will be taken.  Your abdomen will be measured to track your baby's growth.  The fetal heartbeat will be listened to starting around week 10 or 12 of your pregnancy.  Test results from any previous visits will be discussed. Your health care provider may ask you:  How you are feeling.  If you are feeling the baby move.  If you have had any abnormal symptoms, such as leaking fluid, bleeding, severe headaches, or abdominal cramping.  If you are using any tobacco products,   including cigarettes, chewing tobacco, and electronic cigarettes.  If you have any questions. Other tests that may be performed during your first trimester include:  Blood tests to find your blood type and to check for the presence of any previous infections. They will also be used to check for low iron levels (anemia) and Rh antibodies. Later in the pregnancy, blood tests for diabetes will be done along with other tests if problems develop.  Urine tests to check for infections, diabetes, or protein in the urine.  An ultrasound to confirm the proper growth  and development of the baby.  An amniocentesis to check for possible genetic problems.  Fetal screens for spina bifida and Down syndrome.  You may need other tests to make sure you and the baby are doing well.  HIV (human immunodeficiency virus) testing. Routine prenatal testing includes screening for HIV, unless you choose not to have this test. HOME CARE INSTRUCTIONS  Medicines   Follow your health care provider's instructions regarding medicine use. Specific medicines may be either safe or unsafe to take during pregnancy.  Take your prenatal vitamins as directed.  If you develop constipation, try taking a stool softener if your health care provider approves. Diet   Eat regular, well-balanced meals. Choose a variety of foods, such as meat or vegetable-based protein, fish, milk and low-fat dairy products, vegetables, fruits, and whole grain breads and cereals. Your health care provider will help you determine the amount of weight gain that is right for you.  Avoid raw meat and uncooked cheese. These carry germs that can cause birth defects in the baby.  Eating four or five small meals rather than three large meals a day may help relieve nausea and vomiting. If you start to feel nauseous, eating a few soda crackers can be helpful. Drinking liquids between meals instead of during meals also seems to help nausea and vomiting.  If you develop constipation, eat more high-fiber foods, such as fresh vegetables or fruit and whole grains. Drink enough fluids to keep your urine clear or pale yellow. Activity and Exercise   Exercise only as directed by your health care provider. Exercising will help you:  Control your weight.  Stay in shape.  Be prepared for labor and delivery.  Experiencing pain or cramping in the lower abdomen or low back is a good sign that you should stop exercising. Check with your health care provider before continuing normal exercises.  Try to avoid standing for  long periods of time. Move your legs often if you must stand in one place for a long time.  Avoid heavy lifting.  Wear low-heeled shoes, and practice good posture.  You may continue to have sex unless your health care provider directs you otherwise. Relief of Pain or Discomfort   Wear a good support bra for breast tenderness.   Take warm sitz baths to soothe any pain or discomfort caused by hemorrhoids. Use hemorrhoid cream if your health care provider approves.   Rest with your legs elevated if you have leg cramps or low back pain.  If you develop varicose veins in your legs, wear support hose. Elevate your feet for 15 minutes, 3-4 times a day. Limit salt in your diet. Prenatal Care   Schedule your prenatal visits by the twelfth week of pregnancy. They are usually scheduled monthly at first, then more often in the last 2 months before delivery.  Write down your questions. Take them to your prenatal visits.  Keep all your prenatal  legs, wear support hose. Elevate your feet for 15 minutes, 3-4 times a day. Limit salt in your diet.  Prenatal Care  · Schedule your prenatal visits by the twelfth week of pregnancy. They are usually scheduled monthly at first, then more often in the last 2 months before delivery.  · Write down your questions. Take them to your prenatal visits.  · Keep all your prenatal visits as directed by your health care provider.  Safety  · Wear your seat belt at all times when driving.  · Make a list of emergency phone numbers, including numbers for family, friends, the hospital, and police and fire departments.  General Tips  · Ask your health care provider for a referral to a local prenatal education class. Begin classes no later than at the beginning of month 6 of your pregnancy.  · Ask for help if you have counseling or nutritional needs during pregnancy. Your health care provider can offer advice or refer you to specialists for help with various needs.  · Do not use hot tubs, steam rooms, or saunas.  · Do not douche or use tampons or scented sanitary pads.  · Do not cross your legs for long periods of time.  · Avoid cat litter boxes and soil used by cats. These carry germs that can cause birth defects in the baby and possibly loss of the fetus by miscarriage or stillbirth.   · Avoid all smoking, herbs, alcohol, and medicines not prescribed by your health care provider. Chemicals in these affect the formation and growth of the baby.  · Do not use any tobacco products, including cigarettes, chewing tobacco, and electronic cigarettes. If you need help quitting, ask your health care provider. You may receive counseling support and other resources to help you quit.  · Schedule a dentist appointment. At home, brush your teeth with a soft toothbrush and be gentle when you floss.  SEEK MEDICAL CARE IF:   · You have dizziness.  · You have mild pelvic cramps, pelvic pressure, or nagging pain in the abdominal area.  · You have persistent nausea, vomiting, or diarrhea.  · You have a bad smelling vaginal discharge.  · You have pain with urination.  · You notice increased swelling in your face, hands, legs, or ankles.  SEEK IMMEDIATE MEDICAL CARE IF:   · You have a fever.  · You are leaking fluid from your vagina.  · You have spotting or bleeding from your vagina.  · You have severe abdominal cramping or pain.  · You have rapid weight gain or loss.  · You vomit blood or material that looks like coffee grounds.  · You are exposed to German measles and have never had them.  · You are exposed to fifth disease or chickenpox.  · You develop a severe headache.  · You have shortness of breath.  · You have any kind of trauma, such as from a fall or a car accident.     This information is not intended to replace advice given to you by your health care provider. Make sure you discuss any questions you have with your health care provider.     Document Released: 05/19/2001 Document Revised: 06/15/2014 Document Reviewed: 04/04/2013  Elsevier Interactive Patient Education ©2017 [employer]

## 2016-07-14 NOTE — Telephone Encounter (Signed)
Received notice below that MRI was preauthorized.

## 2016-07-15 LAB — GC/CHLAMYDIA PROBE AMP (~~LOC~~) NOT AT ARMC
Chlamydia: NEGATIVE
NEISSERIA GONORRHEA: NEGATIVE

## 2016-07-16 ENCOUNTER — Ambulatory Visit (HOSPITAL_COMMUNITY)
Admission: RE | Admit: 2016-07-16 | Discharge: 2016-07-16 | Disposition: A | Discharge status: Home / Self Care | Payer: Medicaid Other | Source: Ambulatory Visit | Attending: Student | Admitting: Student

## 2016-07-16 ENCOUNTER — Other Ambulatory Visit (HOSPITAL_COMMUNITY): Payer: Medicaid Other

## 2016-07-16 ENCOUNTER — Ambulatory Visit (HOSPITAL_COMMUNITY): Admission: RE | Admit: 2016-07-16 | Payer: Medicaid Other | Source: Ambulatory Visit

## 2016-07-16 DIAGNOSIS — R1903 Right lower quadrant abdominal swelling, mass and lump: Secondary | ICD-10-CM

## 2016-07-16 LAB — OBSTETRIC PANEL, INCLUDING HIV
ANTIBODY SCREEN: NEGATIVE
BASOS: 0 %
Basophils Absolute: 0 10*3/uL (ref 0.0–0.2)
EOS (ABSOLUTE): 0.1 10*3/uL (ref 0.0–0.4)
EOS: 1 %
HEMATOCRIT: 37.9 % (ref 34.0–46.6)
HEMOGLOBIN: 12.6 g/dL (ref 11.1–15.9)
HEP B S AG: NEGATIVE
HIV SCREEN 4TH GENERATION: NONREACTIVE
IMMATURE GRANULOCYTES: 0 %
Immature Grans (Abs): 0 10*3/uL (ref 0.0–0.1)
LYMPHS ABS: 1.8 10*3/uL (ref 0.7–3.1)
Lymphs: 16 %
MCH: 30.7 pg (ref 26.6–33.0)
MCHC: 33.2 g/dL (ref 31.5–35.7)
MCV: 92 fL (ref 79–97)
Monocytes Absolute: 0.5 10*3/uL (ref 0.1–0.9)
Monocytes: 4 %
NEUTROS ABS: 8.6 10*3/uL — AB (ref 1.4–7.0)
Neutrophils: 79 %
Platelets: 358 10*3/uL (ref 150–379)
RBC: 4.1 x10E6/uL (ref 3.77–5.28)
RDW: 14.1 % (ref 12.3–15.4)
RH TYPE: POSITIVE
RPR: NONREACTIVE
Rubella Antibodies, IGG: 2.05 index (ref >0.99)
WBC: 11 10*3/uL — AB (ref 3.4–10.8)

## 2016-07-16 LAB — URINE CULTURE, OB REFLEX

## 2016-07-16 LAB — HEMOGLOBINOPATHY EVALUATION
HGB A: 58.3 % — AB (ref 96.4–98.8)
HGB C: 38.5 % — ABNORMAL HIGH
HGB S: 0 %
HGB VARIANT: 0 %
Hemoglobin A2 Quantitation: 3.2 % (ref 1.8–3.2)
Hemoglobin F Quantitation: 0 % (ref 0.0–2.0)

## 2016-07-16 LAB — CYTOLOGY - PAP
DIAGNOSIS: NEGATIVE
HPV (WINDOPATH): NOT DETECTED

## 2016-07-16 LAB — INHIBIN B: Inhibin B: 116.7 pg/mL

## 2016-07-16 LAB — CULTURE, OB URINE

## 2016-07-16 LAB — VARICELLA ZOSTER ANTIBODY, IGG: Varicella zoster IgG: 179 index (ref >165)

## 2016-07-17 ENCOUNTER — Ambulatory Visit (HOSPITAL_COMMUNITY): Admission: RE | Admit: 2016-07-17 | Payer: Medicaid Other | Source: Ambulatory Visit

## 2016-07-20 LAB — TOXASSURE SELECT 13 (MW), URINE

## 2016-08-04 ENCOUNTER — Encounter: Payer: Self-pay | Admitting: *Deleted

## 2016-08-10 DIAGNOSIS — O0990 Supervision of high risk pregnancy, unspecified, unspecified trimester: Secondary | ICD-10-CM | POA: Insufficient documentation

## 2016-08-11 ENCOUNTER — Ambulatory Visit (INDEPENDENT_AMBULATORY_CARE_PROVIDER_SITE_OTHER): Payer: Medicaid Other | Admitting: Obstetrics and Gynecology

## 2016-08-11 VITALS — BP 94/64 | HR 86 | Wt 106.0 lb

## 2016-08-11 DIAGNOSIS — Z3009 Encounter for other general counseling and advice on contraception: Secondary | ICD-10-CM

## 2016-08-11 DIAGNOSIS — O0990 Supervision of high risk pregnancy, unspecified, unspecified trimester: Secondary | ICD-10-CM

## 2016-08-11 DIAGNOSIS — O0992 Supervision of high risk pregnancy, unspecified, second trimester: Secondary | ICD-10-CM

## 2016-08-11 DIAGNOSIS — R1900 Intra-abdominal and pelvic swelling, mass and lump, unspecified site: Secondary | ICD-10-CM

## 2016-08-11 NOTE — Patient Instructions (Signed)

## 2016-08-11 NOTE — Progress Notes (Signed)
Subjective:  Veronica Green is a 36 y.o. [id number] at [redacted]w[redacted]d being seen today for ongoing prenatal care.  She is currently monitored for the following issues for this high-risk pregnancy and has Pelvic mass in female and Supervision of high risk pregnancy, antepartum on her problem list.  Patient reports no complaints.  Contractions: Not present. Vag. Bleeding: None.   . Denies leaking of fluid.   The following portions of the patient's history were reviewed and updated as appropriate: allergies, current medications, past family history, past medical history, past social history, past surgical history and problem list. Problem list updated.  Objective:   Vitals:   08/11/16 0912  BP: 94/64  Pulse: 86  Weight: 106 lb (48.1 kg)    Fetal Status: Fetal Heart Rate (bpm): 146         General:  Alert, oriented and cooperative. Patient is in no acute distress.  Skin: Skin is warm and dry. No rash noted.   Cardiovascular: Normal heart rate noted  Respiratory: Normal respiratory effort, no problems with respiration noted  Abdomen: Soft, gravid, appropriate for gestational age. Pain/Pressure: Absent     Pelvic:  Cervical exam deferred        Extremities: Normal range of motion.  Edema: None  Mental Status: Normal mood and affect. Normal behavior. Normal judgment and thought content.   Urinalysis: Urine Protein: Negative Urine Glucose: Negative  Assessment and Plan:  Pregnancy: [id number] at [redacted]w[redacted]d  1. Supervision of high risk pregnancy, antepartum  - AFP, Quad Screen - [place] MFM OB COMP + 14 WK; Future  2. Pelvic mass in female S/P Exp Lap with right oophorectomy for luteoma of pregnancy at [facility], Dr. Janie Morning F/U appt with her later this month   Preterm labor symptoms and general obstetric precautions including but not limited to vaginal bleeding, contractions, leaking of fluid and fetal movement were reviewed in detail with the patient. Please refer to After Visit Summary for other  counseling recommendations.  Return in about 4 weeks (around 09/08/2016) for OB visit.   [name], MD

## 2016-08-19 ENCOUNTER — Other Ambulatory Visit: Payer: Self-pay | Admitting: *Deleted

## 2016-08-19 DIAGNOSIS — O280 Abnormal hematological finding on antenatal screening of mother: Secondary | ICD-10-CM

## 2016-08-19 LAB — AFP, QUAD SCREEN
DIA Mom Value: 0.78
DIA Value (EIA): 168.02 pg/mL
DSR (BY AGE) 1 IN: 305
DSR (SECOND TRIMESTER) 1 IN: 2596
Gestational Age: 16.7 WEEKS
MSAFP MOM: 1.07
MSAFP: 52.5 ng/mL
MSHCG Mom: 0.3
MSHCG: 12677 m[IU]/mL
Maternal Age At EDD: 34.9 YEARS
Osb Risk: 10000
Test Results:: POSITIVE — AB
Tri 18 Risk At Term: 1:73 {titer}
WEIGHT: 110 [lb_av]
uE3 Mom: 0.6
uE3 Value: 0.65 ng/mL

## 2016-08-19 NOTE — Progress Notes (Signed)
Orders for MFM and Genetics have been entered for abnormal AFP screen.  Attempt to contact pt regarding results.  LM on VM to call office.

## 2016-08-26 ENCOUNTER — Encounter (HOSPITAL_COMMUNITY): Payer: Self-pay

## 2016-08-26 ENCOUNTER — Ambulatory Visit (HOSPITAL_COMMUNITY)
Admission: RE | Admit: 2016-08-26 | Discharge: 2016-08-26 | Disposition: A | Discharge status: Home / Self Care | Payer: Medicaid Other | Source: Ambulatory Visit | Attending: Obstetrics and Gynecology | Admitting: Obstetrics and Gynecology

## 2016-08-26 ENCOUNTER — Other Ambulatory Visit: Payer: Self-pay | Admitting: Obstetrics and Gynecology

## 2016-08-26 DIAGNOSIS — O3482 Maternal care for other abnormalities of pelvic organs, second trimester: Secondary | ICD-10-CM | POA: Insufficient documentation

## 2016-08-26 DIAGNOSIS — O283 Abnormal ultrasonic finding on antenatal screening of mother: Secondary | ICD-10-CM | POA: Diagnosis not present

## 2016-08-26 DIAGNOSIS — Z363 Encounter for antenatal screening for malformations: Secondary | ICD-10-CM | POA: Diagnosis not present

## 2016-08-26 DIAGNOSIS — Z3A19 19 weeks gestation of pregnancy: Secondary | ICD-10-CM

## 2016-08-26 DIAGNOSIS — O280 Abnormal hematological finding on antenatal screening of mother: Secondary | ICD-10-CM

## 2016-08-26 DIAGNOSIS — O352XX0 Maternal care for (suspected) hereditary disease in fetus, not applicable or unspecified: Secondary | ICD-10-CM

## 2016-08-26 DIAGNOSIS — O289 Unspecified abnormal findings on antenatal screening of mother: Secondary | ICD-10-CM

## 2016-08-26 DIAGNOSIS — O0990 Supervision of high risk pregnancy, unspecified, unspecified trimester: Secondary | ICD-10-CM

## 2016-08-26 DIAGNOSIS — Z3A18 18 weeks gestation of pregnancy: Secondary | ICD-10-CM | POA: Insufficient documentation

## 2016-08-27 DIAGNOSIS — O352XX0 Maternal care for (suspected) hereditary disease in fetus, not applicable or unspecified: Secondary | ICD-10-CM | POA: Insufficient documentation

## 2016-08-27 DIAGNOSIS — O289 Unspecified abnormal findings on antenatal screening of mother: Secondary | ICD-10-CM | POA: Insufficient documentation

## 2016-08-27 DIAGNOSIS — Z3A19 19 weeks gestation of pregnancy: Secondary | ICD-10-CM | POA: Insufficient documentation

## 2016-08-27 NOTE — Progress Notes (Signed)
Genetic Counseling  DOB: 1981/03/20 Referring Provider: [name], MD Appointment Date: 08/26/2016 Attending: Dr. Renella Cunas  Veronica Green was seen for genetic counseling because of an increased risk for fetal trisomy 18 based on a maternal serum Quad screen.  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  We reviewed chromosomes, nondisjunction, and the common features and poor prognosis of trisomy 82.  In addition, we reviewed the screen adjusted reduction in risks for Down syndrome and open neural tube defects.  We also discussed other explanations for a screen positive result including: a gestational dating error, differences in maternal metabolism, the presence of the luteoma of pregnancy and normal variation.  We reviewed other available screening options including noninvasive prenatal screening (NIPS)/cell free DNA (cfDNA) screening, and detailed ultrasound.  She was counseled that screening tests are used to modify a patient's a priori risk for aneuploidy, typically based on age. This estimate provides a pregnancy specific risk assessment. We reviewed the benefits and limitations of each option. Specifically, we discussed the conditions for which each test screens, the detection rates, and false positive rates of each. She was also counseled regarding diagnostic  testing via amniocentesis. We reviewed the approximate 1 in [phone] risk for complications from amniocentesis, including spontaneous pregnancy loss. We discussed the possible results that the tests might provide including: positive, negative, unanticipated, and no result. Finally, she was counseled regarding the cost of each option and potential out of pocket expenses.  A complete ultrasound was performed today. The ultrasound report will be sent under separate cover. There were no visualized fetal anomalies or markers suggestive of aneuploidy. After consideration of all the options, she elected to proceed with NIPS.  Those results will be available in 8-10 days.   Veronica Green had a luteoma of pregnancy removed at [redacted] weeks gestation.  She had elevated testosterone at that time, but reports no symptoms of virilization in herself.  We discussed that luteomas of pregnancy are hyperplastic masses of large lutein cells which regress after delivery.  Studies have show that they can secrete testosterone, dihydrotestosterone and androstenedione and female fetuses can be virilized.  Ultrasound suggested this is a female fetus, such that virilization would not be an issue, however, virilized female fetuses may appear female on prenatal ultrasound.  Veronica Green understands that NIPS is also able to evaluate fetal sex.    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  We discussed that hemoglobin C is due to a variant of the gene that makes the beta chain component of hemoglobin.  It is similar to hemoglobin S, which is associated with Sickle cell anemia. We discussed that when a baby inherits hemoglobin C from one parent and a hemoglobin variant from the other parent they will have a clinically significant hemoglobinopathy.  We reviewed autosomal recessive inheritance and the 1 in 4 (25%) chance with each  pregnancy to have a child with a clinically significant hemoglobinopathy, if the father of the baby  is also a carrier for a hemoglobin variant.  Veronica Green does not believe that the father of the baby has ever had screening for hemoglobin disorders, thus he would have the general population chance, of approximately 1 in 10, to be a carrier of a hemoglobin variant.  She was counseled that hemoglobinopathies are typically characterized by episodes of vaso-occlusive crisis and chronic anemia due to the tendency of red blood cells to become deformed under conditions of decreased oxygen tension.  The basic defects involve a change in each beta globin gene which results in the production of abnormal beta chains.  The presence of only these abnormal chains alters the configuration of the hemoglobin molecule causing the cells to obstruct blood flow in small vessels.  Ischemia of tissues and organs results.  Increased cell fragility with increased phagocytosis and splenic sequestration of the fragile cells produces anemia.  [place] disease is similar to, but tends to be milder than, sickle cell anemia.  Veronica Green will encourage the father of the baby to have a quantitative hemoglobin electrophoresis, and will notify the genetic counselor if it is abnormal.  Veronica Green  was provided with written information regarding cystic fibrosis (CF) and spinal muscular atrophy (SMA) including the carrier frequency, availability of carrier screening and prenatal diagnosis if indicated.  In addition, we discussed that CF and hemoglobinopathies are routinely screened for as part of the [place] newborn screening panel.  After further discussion, she declined screening for CF and SMA today.   Both family histories were reviewed and found to be contributory for hemoglobin C as discussed.  There was no other family history of genetic conditions, birth defects or intellectual disability. Without further information regarding the provided family history, an accurate genetic risk cannot be calculated. Further genetic counseling is warranted if more  information is obtained.  [name] denied exposure to environmental toxins or chemical agents. She denied the use of alcohol, tobacco or street drugs. She denied significant viral illnesses during the course of her pregnancy. Her medical and surgical histories were noncontributory.   I counseled Ms. Deblois for approximately 60 minutes regarding the above risks and available options.   Cam Hai, MS,  Certified Genetic Counselor

## 2016-09-02 ENCOUNTER — Other Ambulatory Visit: Payer: Self-pay

## 2016-09-03 ENCOUNTER — Telehealth (HOSPITAL_COMMUNITY): Payer: Self-pay | Admitting: Genetics

## 2016-09-03 NOTE — Telephone Encounter (Signed)
Called [name] to discuss her prenatal cell free DNA screening results.  Mrs. Veronica Green had Panorama testing through [facility].  Testing was offered because of an increased risk for Trisomy 17 and concern for virilization due to a maternal Luteoma of preganncy.   The patient was identified by name and DOB.  We reviewed that these are within normal limits, showing a less than 1 in 10,000 risk for trisomies 21, 18 and 13, and monosomy X (Turner syndrome).  In addition, the risk for triploidy and sex chromosome trisomies (47,XXX and 47,XXY) was also low risk. We reviewed that this testing identifies > 99% of pregnancies with trisomy 51, trisomy 53, sex chromosome trisomies (47,XXX and 47,XXY), and triploidy. The detection rate for trisomy 18 is 96%.  The detection rate for monosomy X is ~92%.  The false positive rate is <0.1% for all conditions. Testing was also consistent with female fetal sex.  The patient did wish to know fetal sex.  She understands that this testing does not identify all genetic conditions.  All questions were answered to her satisfaction, she was encouraged to call with additional questions or concerns.  Cam Hai, MS Certified Genetic Counselor

## 2016-09-08 ENCOUNTER — Encounter: Payer: Medicaid Other | Admitting: Obstetrics and Gynecology

## 2016-10-09 ENCOUNTER — Other Ambulatory Visit: Payer: Self-pay

## 2016-10-29 ENCOUNTER — Ambulatory Visit (INDEPENDENT_AMBULATORY_CARE_PROVIDER_SITE_OTHER): Payer: Medicaid Other | Admitting: Obstetrics & Gynecology

## 2016-10-29 ENCOUNTER — Encounter: Payer: Self-pay | Admitting: Obstetrics & Gynecology

## 2016-10-29 DIAGNOSIS — O0990 Supervision of high risk pregnancy, unspecified, unspecified trimester: Secondary | ICD-10-CM

## 2016-10-29 DIAGNOSIS — O0992 Supervision of high risk pregnancy, unspecified, second trimester: Secondary | ICD-10-CM | POA: Diagnosis not present

## 2016-10-29 DIAGNOSIS — R1900 Intra-abdominal and pelvic swelling, mass and lump, unspecified site: Secondary | ICD-10-CM

## 2016-10-29 NOTE — Patient Instructions (Signed)

## 2016-10-29 NOTE — Progress Notes (Signed)
Tried calling the patient 3x but tel# does not work. Patient needs GTT.

## 2016-10-29 NOTE — Progress Notes (Signed)
   PRENATAL VISIT NOTE  Subjective:  Veronica Green is a 36 y.o. [id number] at [redacted]w[redacted]d being seen today for ongoing prenatal care.  She is currently monitored for the following issues for this low-risk pregnancy and has Pelvic mass in female; Supervision of high risk pregnancy, antepartum; Unwanted fertility; [redacted] weeks gestation of pregnancy; Abnormal findings on antenatal screening; and Hereditary disease in family possibly affecting fetus, affecting management of mother, antepartum condition or complication, not applicable or unspecified fetus on her problem list.  Patient reports no complaints.  Contractions: Not present. Vag. Bleeding: None.  Movement: Present. Denies leaking of fluid.   The following portions of the patient's history were reviewed and updated as appropriate: allergies, current medications, past family history, past medical history, past social history, past surgical history and problem list. Problem list updated.  Objective:   Vitals:   10/29/16 1044  BP: 95/65  Pulse: 79  Weight: 124 lb 14.4 oz (56.7 kg)    Fetal Status: Fetal Heart Rate (bpm): 134   Movement: Present     General:  Alert, oriented and cooperative. Patient is in no acute distress.  Skin: Skin is warm and dry. No rash noted.   Cardiovascular: Normal heart rate noted  Respiratory: Normal respiratory effort, no problems with respiration noted  Abdomen: Soft, gravid, appropriate for gestational age. Pain/Pressure: Absent     Pelvic:  Cervical exam deferred        Extremities: Normal range of motion.  Edema: None  Mental Status: Normal mood and affect. Normal behavior. Normal judgment and thought content.   Assessment and Plan:  Pregnancy: [id number] at [redacted]w[redacted]d  1. Supervision of high risk pregnancy, antepartum Was not fasting for 28 week labs  - Glucose tolerance, 1 hour  2. Pelvic mass in female Removed 07/22/16  Preterm labor symptoms and general obstetric precautions including but not limited to vaginal  bleeding, contractions, leaking of fluid and fetal movement were reviewed in detail with the patient. Please refer to After Visit Summary for other counseling recommendations.  Return in about 2 weeks (around 11/12/2016).   Emeterio Reeve, MD

## 2016-10-30 LAB — GLUCOSE TOLERANCE, 1 HOUR: Glucose, 1Hr PP: 57 mg/dL — ABNORMAL LOW (ref 65–199)

## 2016-11-12 ENCOUNTER — Encounter: Payer: Self-pay | Admitting: Obstetrics

## 2016-11-12 ENCOUNTER — Ambulatory Visit (INDEPENDENT_AMBULATORY_CARE_PROVIDER_SITE_OTHER): Payer: Medicaid Other | Admitting: Obstetrics

## 2016-11-12 VITALS — BP 103/65 | HR 80 | Wt 129.1 lb

## 2016-11-12 DIAGNOSIS — Z3480 Encounter for supervision of other normal pregnancy, unspecified trimester: Secondary | ICD-10-CM

## 2016-11-12 DIAGNOSIS — Z3483 Encounter for supervision of other normal pregnancy, third trimester: Secondary | ICD-10-CM

## 2016-11-12 NOTE — Progress Notes (Signed)
Subjective:  Veronica Green is a 36 y.o. [id number] at [redacted]w[redacted]d being seen today for ongoing prenatal care.  She is currently monitored for the following issues for this low-risk pregnancy and has Pelvic mass in female; Supervision of high risk pregnancy, antepartum; Unwanted fertility; [redacted] weeks gestation of pregnancy; Abnormal findings on antenatal screening; and Hereditary disease in family possibly affecting fetus, affecting management of mother, antepartum condition or complication, not applicable or unspecified fetus on her problem list.  Patient reports no complaints.  Contractions: Irregular. Vag. Bleeding: None.  Movement: Present. Denies leaking of fluid.   The following portions of the patient's history were reviewed and updated as appropriate: allergies, current medications, past family history, past medical history, past social history, past surgical history and problem list. Problem list updated.  Objective:   Vitals:   11/12/16 1003  BP: 103/65  Pulse: 80  Weight: 129 lb 1.6 oz (58.6 kg)    Fetal Status: Fetal Heart Rate (bpm): 140   Movement: Present     General:  Alert, oriented and cooperative. Patient is in no acute distress.  Skin: Skin is warm and dry. No rash noted.   Cardiovascular: Normal heart rate noted  Respiratory: Normal respiratory effort, no problems with respiration noted  Abdomen: Soft, gravid, appropriate for gestational age. Pain/Pressure: Absent     Pelvic:  Cervical exam deferred        Extremities: Normal range of motion.  Edema: None  Mental Status: Normal mood and affect. Normal behavior. Normal judgment and thought content.   Urinalysis:      Assessment and Plan:  Pregnancy: [id number] at [redacted]w[redacted]d  There are no diagnoses linked to this encounter. Preterm labor symptoms and general obstetric precautions including but not limited to vaginal bleeding, contractions, leaking of fluid and fetal movement were reviewed in detail with the patient. Please refer to  After Visit Summary for other counseling recommendations.  Return in 2 weeks (on 11/26/2016) for ROB.   Shelly Bombard, MD  Patient ID: Veronica Green, female   DOB: 1980/07/05, 36 y.o.   MRN: [id number]

## 2016-11-26 ENCOUNTER — Encounter: Payer: Medicaid Other | Admitting: Obstetrics

## 2016-12-10 ENCOUNTER — Encounter: Payer: Medicaid Other | Admitting: Obstetrics

## 2016-12-18 ENCOUNTER — Ambulatory Visit (INDEPENDENT_AMBULATORY_CARE_PROVIDER_SITE_OTHER): Payer: Medicaid Other | Admitting: Obstetrics

## 2016-12-18 ENCOUNTER — Encounter: Payer: Self-pay | Admitting: Obstetrics

## 2016-12-18 VITALS — BP 95/65 | HR 81 | Wt 134.6 lb

## 2016-12-18 DIAGNOSIS — K219 Gastro-esophageal reflux disease without esophagitis: Secondary | ICD-10-CM

## 2016-12-18 DIAGNOSIS — O0990 Supervision of high risk pregnancy, unspecified, unspecified trimester: Secondary | ICD-10-CM

## 2016-12-18 DIAGNOSIS — O0993 Supervision of high risk pregnancy, unspecified, third trimester: Secondary | ICD-10-CM

## 2016-12-18 LAB — OB RESULTS CONSOLE GBS: GBS: NEGATIVE

## 2016-12-18 MED ORDER — OMEPRAZOLE 20 MG PO CPDR: 20.0000 mg | DELAYED_RELEASE_CAPSULE | Freq: Two times a day (BID) | ORAL | 5 refills | Status: DC

## 2016-12-18 NOTE — Progress Notes (Signed)
Patient is in the office and reports good fetal movement

## 2016-12-18 NOTE — Progress Notes (Signed)
Subjective:  Veronica Green is a 36 y.o. [id number] at [redacted]w[redacted]d being seen today for ongoing prenatal care.  She is currently monitored for the following issues for this low-risk pregnancy and has Pelvic mass in female; Supervision of high risk pregnancy, antepartum; Unwanted fertility; [redacted] weeks gestation of pregnancy; Abnormal findings on antenatal screening; and Hereditary disease in family possibly affecting fetus, affecting management of mother, antepartum condition or complication, not applicable or unspecified fetus on her problem list.  Patient reports heartburn.  Contractions: Irregular. Vag. Bleeding: None.  Movement: Present. Denies leaking of fluid.   The following portions of the patient's history were reviewed and updated as appropriate: allergies, current medications, past family history, past medical history, past social history, past surgical history and problem list. Problem list updated.  Objective:   Vitals:   12/18/16 1122  BP: 95/65  Pulse: 81  Weight: 134 lb 9.6 oz (61.1 kg)    Fetal Status: Fetal Heart Rate (bpm): 140   Movement: Present     General:  Alert, oriented and cooperative. Patient is in no acute distress.  Skin: Skin is warm and dry. No rash noted.   Cardiovascular: Normal heart rate noted  Respiratory: Normal respiratory effort, no problems with respiration noted  Abdomen: Soft, gravid, appropriate for gestational age. Pain/Pressure: Absent     Pelvic:  Cervical exam deferred        Extremities: Normal range of motion.  Edema: None  Mental Status: Normal mood and affect. Normal behavior. Normal judgment and thought content.   Urinalysis:      Assessment and Plan:  Pregnancy: [id number] at [redacted]w[redacted]d  1. Supervision of high risk pregnancy, antepartum Rx: - Strep Gp B NAA  2. GERD without esophagitis Rx: - omeprazole (PRILOSEC) 20 MG capsule; Take 1 capsule (20 mg total) by mouth 2 (two) times daily before a meal.  Dispense: 60 capsule; Refill: 5  Preterm  labor symptoms and general obstetric precautions including but not limited to vaginal bleeding, contractions, leaking of fluid and fetal movement were reviewed in detail with the patient. Please refer to After Visit Summary for other counseling recommendations.  Return in about 1 week (around 12/25/2016) for [place].   Shelly Bombard, MDPatient ID: Veronica Green, female   DOB: 1981-01-14, 36 y.o.   MRN: [id number]

## 2016-12-20 LAB — STREP GP B NAA: STREP GROUP B AG: NEGATIVE

## 2016-12-25 ENCOUNTER — Encounter: Payer: Medicaid Other | Admitting: Obstetrics

## 2016-12-28 ENCOUNTER — Encounter (HOSPITAL_COMMUNITY): Payer: Self-pay

## 2016-12-28 ENCOUNTER — Inpatient Hospital Stay (HOSPITAL_COMMUNITY)
Admission: AD | Admit: 2016-12-28 | Discharge: 2016-12-29 | Disposition: A | Discharge status: Home / Self Care | Payer: Medicaid Other | Source: Ambulatory Visit | Attending: Family Medicine | Admitting: Family Medicine

## 2016-12-28 DIAGNOSIS — O0990 Supervision of high risk pregnancy, unspecified, unspecified trimester: Secondary | ICD-10-CM

## 2016-12-28 DIAGNOSIS — Z87891 Personal history of nicotine dependence: Secondary | ICD-10-CM | POA: Insufficient documentation

## 2016-12-28 DIAGNOSIS — O99613 Diseases of the digestive system complicating pregnancy, third trimester: Secondary | ICD-10-CM | POA: Insufficient documentation

## 2016-12-28 DIAGNOSIS — Z3A36 36 weeks gestation of pregnancy: Secondary | ICD-10-CM | POA: Insufficient documentation

## 2016-12-28 DIAGNOSIS — K529 Noninfective gastroenteritis and colitis, unspecified: Secondary | ICD-10-CM | POA: Insufficient documentation

## 2016-12-28 MED ORDER — PROMETHAZINE HCL 25 MG/ML IJ SOLN
25.0000 mg | Freq: Once | INTRAVENOUS | Status: DC
  Filled 2016-12-28: qty 1

## 2016-12-28 NOTE — Progress Notes (Signed)
Pt up to bedside commode with c/o diarrhea.

## 2016-12-28 NOTE — Progress Notes (Signed)
Pt up on bedside commode. Pt having frequent diarrhea and unable to sit in bed. Monitor tracing maternal heart rate while pt sitting up.

## 2016-12-28 NOTE — MAU Note (Signed)
Pt c/o vomiting and diarrhea that started about 45 minutes ago. Pt states she started feeling contractions at the same time. Pt states the baby is moving normally. Pt denies bleeding and leaking of fluid.

## 2016-12-29 DIAGNOSIS — O9989 Other specified diseases and conditions complicating pregnancy, childbirth and the puerperium: Secondary | ICD-10-CM | POA: Diagnosis not present

## 2016-12-29 DIAGNOSIS — Z87891 Personal history of nicotine dependence: Secondary | ICD-10-CM | POA: Diagnosis not present

## 2016-12-29 DIAGNOSIS — K529 Noninfective gastroenteritis and colitis, unspecified: Secondary | ICD-10-CM | POA: Diagnosis not present

## 2016-12-29 DIAGNOSIS — Z3A36 36 weeks gestation of pregnancy: Secondary | ICD-10-CM | POA: Diagnosis not present

## 2016-12-29 DIAGNOSIS — O99613 Diseases of the digestive system complicating pregnancy, third trimester: Secondary | ICD-10-CM | POA: Diagnosis not present

## 2016-12-29 DIAGNOSIS — O212 Late vomiting of pregnancy: Secondary | ICD-10-CM | POA: Diagnosis present

## 2016-12-29 LAB — COMPREHENSIVE METABOLIC PANEL
ALBUMIN: 2.7 g/dL — AB (ref 3.5–5.0)
ALK PHOS: 106 U/L (ref 38–126)
ALT: 13 U/L — ABNORMAL LOW (ref 14–54)
AST: 25 U/L (ref 15–41)
Anion gap: 8 (ref 5–15)
BILIRUBIN TOTAL: 0.6 mg/dL (ref 0.3–1.2)
BUN: 8 mg/dL (ref 6–20)
CALCIUM: 7.9 mg/dL — AB (ref 8.9–10.3)
CO2: 20 mmol/L — ABNORMAL LOW (ref 22–32)
CREATININE: 0.41 mg/dL — AB (ref 0.44–1.00)
Chloride: 105 mmol/L (ref 101–111)
GFR calc Af Amer: >60 mL/min (ref >60)
GLUCOSE: 92 mg/dL (ref 65–99)
Potassium: 3.6 mmol/L (ref 3.5–5.1)
Sodium: 133 mmol/L — ABNORMAL LOW (ref 135–145)
TOTAL PROTEIN: 6 g/dL — AB (ref 6.5–8.1)

## 2016-12-29 LAB — CBC
HEMATOCRIT: 34.3 % — AB (ref 36.0–46.0)
HEMOGLOBIN: 12.4 g/dL (ref 12.0–15.0)
MCH: 31.2 pg (ref 26.0–34.0)
MCHC: 36.2 g/dL — ABNORMAL HIGH (ref 30.0–36.0)
MCV: 86.4 fL (ref 78.0–100.0)
Platelets: 253 10*3/uL (ref 150–400)
RBC: 3.97 MIL/uL (ref 3.87–5.11)
RDW: 14.7 % (ref 11.5–15.5)
WBC: 15.4 10*3/uL — AB (ref 4.0–10.5)

## 2016-12-29 MED ORDER — M.V.I. ADULT IV INJ
INJECTION | Freq: Once | INTRAVENOUS | Status: AC
  Administered 2016-12-29: 01:00:00 via INTRAVENOUS
  Filled 2016-12-29: qty 1000

## 2016-12-29 MED ORDER — SODIUM CHLORIDE 0.9 % IV SOLN
Freq: Once | INTRAVENOUS | Status: DC
  Filled 2016-12-29: qty 1000

## 2016-12-29 MED ORDER — ONDANSETRON HCL 4 MG PO TABS: 4.0000 mg | ORAL_TABLET | Freq: Three times a day (TID) | ORAL | 0 refills | Status: DC | PRN

## 2016-12-29 MED ORDER — ONDANSETRON HCL 40 MG/20ML IJ SOLN
8.0000 mg | Freq: Once | INTRAMUSCULAR | Status: AC
  Administered 2016-12-29: 8 mg via INTRAVENOUS
  Filled 2016-12-29: qty 4

## 2016-12-29 NOTE — MAU Provider Note (Signed)
Patient Veronica Green is a 36 y.o. [id number] At ,[redacted]w[redacted]d here with complaints of sudden onset of N/V and diarrhea that started at 10 pm this evening.  For dinner she had [employer] pizza; she can't remember what she had for breakfast or dinner last night.   Patient denies bleeding, LOF or decreased fetal movements.    History     CSN: [id number]  Arrival date and time: 12/28/16 2247   First Provider Initiated Contact with Patient 12/29/16 0037      Chief Complaint  Patient presents with  . Contractions  . Emesis  . Diarrhea   Emesis   This is a new problem. The current episode started today. The problem occurs 5 to 10 times per day. The problem has been unchanged. The emesis has an appearance of stomach contents. There has been no fever. Associated symptoms include diarrhea. Pertinent negatives include no chest pain, chills, coughing, fever, sweats or URI. She has tried nothing for the symptoms.  Diarrhea   This is a new problem. The current episode started today. The problem occurs 5 to 10 times per day. The problem has been unchanged. The stool consistency is described as watery. Associated symptoms include vomiting. Pertinent negatives include no chills, coughing, fever, sweats or URI. There are no known risk factors. She has tried nothing for the symptoms.    OB History    Gravida Para Term Preterm AB Living   6 4 4  0 1 4   SAB TAB Ectopic Multiple Live Births   0 1 0 0 4      Past Medical History:  Diagnosis Date  . Anxiety   . Ascites   . Sickle cell trait ([place])   . Umbilical hernia     Past Surgical History:  Procedure Laterality Date  . DILATION AND CURETTAGE OF UTERUS    . RIGHT OOPHORECTOMY  07/2016  . TUMOR REMOVAL  07/2016    Family History  Problem Relation Age of Onset  . Cancer Maternal Grandmother   . Cancer Paternal Grandmother   . Asthma Mother   . Multiple sclerosis Sister   . Anesthesia problems Neg Hx   . Hypotension Neg Hx   . Malignant  hyperthermia Neg Hx   . Pseudochol deficiency Neg Hx     Social History  Substance Use Topics  . Smoking status: Former Smoker    Packs/day: 0.50    Types: Cigarettes    Quit date: 11/21/2015  . Smokeless tobacco: Never Used     Comment: unable to smoke last 2 wks  . Alcohol use No     Comment: occ    Allergies: No Known Allergies  Prescriptions Prior to Admission  Medication Sig Dispense Refill Last Dose  . Doxylamine-Pyridoxine (DICLEGIS) 10-10 MG TBEC Take 2 tablets by mouth at bedtime. If symptoms persist add one tablet every morning starting on day 3. If symptoms persist add 1 tablet every afternoon on day 4. (Patient not taking: Reported on 07/13/2016) 60 tablet 3 Not Taking  . omeprazole (PRILOSEC) 20 MG capsule Take 1 capsule (20 mg total) by mouth 2 (two) times daily before a meal. 60 capsule 5   . Prenatal Vit-Fe Fumarate-FA (PREPLUS) 27-1 MG TABS Take 1 tablet by mouth daily. 30 tablet 13 Taking    Review of Systems  Constitutional: Negative for chills and fever.  Respiratory: Negative for cough.   Cardiovascular: Negative for chest pain.  Gastrointestinal: Positive for diarrhea and vomiting.  Genitourinary: Negative.   Musculoskeletal:  Negative.   Neurological: Negative.    Physical Exam   Blood pressure 105/60, pulse 79, temperature 98.4 F (36.9 C), temperature source Oral, resp. rate 18, height 5\' 4"  (1.626 m), weight 134 lb (60.8 kg), last menstrual period 04/15/2016, SpO2 98 %, unknown if currently breastfeeding.  Physical Exam  Constitutional: She is oriented to person, place, and time. She appears well-developed.  HENT:  Head: Normocephalic.  Neck: Normal range of motion.  Respiratory: Effort normal.  GI: Soft. She exhibits no distension and no mass. There is no tenderness. There is no rebound and no guarding.  Genitourinary: Vagina normal.  Genitourinary Comments: Cervix is closed, long, and thick  Musculoskeletal: Normal range of motion.   Neurological: She is alert and oriented to person, place, and time.  Skin: Skin is warm and dry.  Psychiatric: She has a normal mood and affect.    MAU Course  Procedures  MDM -IV fluids with MV and 8 mg of Zofran -CBC and CMP normal, no signs of acute infection -NST: 130 bpm, mod variability, present acel, neg decels (except when patient is sitting on bedside commode) and uterine irratability  Over the course of her 4 hour MAU stay, her N/V and diarrhea subsided. Patient feels well and desires to go home.   Assessment and Plan   1. Gastroenteritis   2. Supervision of high risk pregnancy, antepartum    2. Patient stable for discharge with RX for Zofran and recommendations for BRAT diet.  3. Patient to keep prenatal visit on 01-01-2017 4. Reviewed return precautions; all questions answered.   Mervyn Skeeters Leiah Giannotti CNM 12/29/2016, 3:09 AM

## 2016-12-29 NOTE — Discharge Instructions (Signed)
Food Choices to Help Relieve Diarrhea, Adult When you have diarrhea, the foods you eat and your eating habits are very important. Choosing the right foods and drinks can help:  Relieve diarrhea.  Replace lost fluids and nutrients.  Prevent dehydration.  What general guidelines should I follow? Relieving diarrhea  Choose foods with less than 2 g or .07 oz. of fiber per serving.  Limit fats to less than 8 tsp (38 g or 1.34 oz.) a day.  Avoid the following: ? Foods and beverages sweetened with high-fructose corn syrup, honey, or sugar alcohols such as xylitol, sorbitol, and mannitol. ? Foods that contain a lot of fat or sugar. ? Fried, greasy, or spicy foods. ? High-fiber grains, breads, and cereals. ? Raw fruits and vegetables.  Eat foods that are rich in probiotics. These foods include dairy products such as yogurt and fermented milk products. They help increase healthy bacteria in the stomach and intestines (gastrointestinal tract, or GI tract).  If you have lactose intolerance, avoid dairy products. These may make your diarrhea worse.  Take medicine to help stop diarrhea (antidiarrheal medicine) only as told by your health care provider. Replacing nutrients  Eat small meals or snacks every 3-4 hours.  Eat bland foods, such as white rice, toast, or baked potato, until your diarrhea starts to get better. Gradually reintroduce nutrient-rich foods as tolerated or as told by your health care provider. This includes: ? Well-cooked protein foods. ? Peeled, seeded, and soft-cooked fruits and vegetables. ? Low-fat dairy products.  Take vitamin and mineral supplements as told by your health care provider. Preventing dehydration   Start by sipping water or a special solution to prevent dehydration (oral rehydration solution, ORS). Urine that is clear or pale yellow means that you are getting enough fluid.  Try to drink at least 8-10 cups of fluid each day to help replace lost  fluids.  You may add other liquids in addition to water, such as clear juice or decaffeinated sports drinks, as tolerated or as told by your health care provider.  Avoid drinks with caffeine, such as coffee, tea, or soft drinks.  Avoid alcohol. What foods are recommended? The items listed may not be a complete list. Talk with your health care provider about what dietary choices are best for you. Grains White rice. White, French, or pita breads (fresh or toasted), including plain rolls, buns, or bagels. White pasta. Saltine, soda, or graham crackers. Pretzels. Low-fiber cereal. Cooked cereals made with water (such as cornmeal, farina, or cream cereals). Plain muffins. Matzo. Melba toast. Zwieback. Vegetables Potatoes (without the skin). Most well-cooked and canned vegetables without skins or seeds. Tender lettuce. Fruits Apple sauce. Fruits canned in juice. Cooked apricots, cherries, grapefruit, peaches, pears, or plums. Fresh bananas and cantaloupe. Meats and other protein foods Baked or boiled chicken. Eggs. Tofu. Fish. Seafood. Smooth nut butters. Ground or well-cooked tender beef, ham, veal, lamb, pork, or poultry. Dairy Plain yogurt, kefir, and unsweetened liquid yogurt. Lactose-free milk, buttermilk, skim milk, or soy milk. Low-fat or nonfat hard cheese. Beverages Water. Low-calorie sports drinks. Fruit juices without pulp. Strained tomato and vegetable juices. Decaffeinated teas. Sugar-free beverages not sweetened with sugar alcohols. Oral rehydration solutions, if approved by your health care provider. Seasoning and other foods Bouillon, broth, or soups made from recommended foods. What foods are not recommended? The items listed may not be a complete list. Talk with your health care provider about what dietary choices are best for you. Grains Whole grain, whole wheat,   bran, or rye breads, rolls, pastas, and crackers. Wild or Pallo rice. Whole grain or bran cereals. Barley. Oats and  oatmeal. Corn tortillas or taco shells. Granola. Popcorn. Vegetables Raw vegetables. Fried vegetables. Cabbage, broccoli, Brussels sprouts, artichokes, baked beans, beet greens, corn, kale, legumes, peas, sweet potatoes, and yams. Potato skins. Cooked spinach and cabbage. Fruits Dried fruit, including raisins and dates. Raw fruits. Stewed or dried prunes. Canned fruits with syrup. Meat and other protein foods Fried or fatty meats. Deli meats. Chunky nut butters. Nuts and seeds. Beans and lentils. Bacon. Hot dogs. Sausage. Dairy High-fat cheeses. Whole milk, chocolate milk, and beverages made with milk, such as milk shakes. Half-and-half. Cream. sour cream. Ice cream. Beverages Caffeinated beverages (such as coffee, tea, soda, or energy drinks). Alcoholic beverages. Fruit juices with pulp. Prune juice. Soft drinks sweetened with high-fructose corn syrup or sugar alcohols. High-calorie sports drinks. Fats and oils Butter. Cream sauces. Margarine. Salad oils. Plain salad dressings. Olives. Avocados. Mayonnaise. Sweets and desserts Sweet rolls, doughnuts, and sweet breads. Sugar-free desserts sweetened with sugar alcohols such as xylitol and sorbitol. Seasoning and other foods Honey. Hot sauce. Chili powder. Gravy. Cream-based or milk-based soups. Pancakes and waffles. Summary  When you have diarrhea, the foods you eat and your eating habits are very important.  Make sure you get at least 8-10 cups of fluid each day, or enough to keep your urine clear or pale yellow.  Eat bland foods and gradually reintroduce healthy, nutrient-rich foods as tolerated, or as told by your health care provider.  Avoid high-fiber, fried, greasy, or spicy foods. This information is not intended to replace advice given to you by your health care provider. Make sure you discuss any questions you have with your health care provider. Document Released: 08/15/2003 Document Revised: 05/22/2016 Document Reviewed:  05/22/2016 Elsevier Interactive Patient Education  2017 [employer]  

## 2017-01-01 ENCOUNTER — Ambulatory Visit (INDEPENDENT_AMBULATORY_CARE_PROVIDER_SITE_OTHER): Payer: Medicaid Other | Admitting: Obstetrics

## 2017-01-01 ENCOUNTER — Encounter: Payer: Self-pay | Admitting: Obstetrics

## 2017-01-01 VITALS — BP 101/67 | HR 78 | Wt 136.4 lb

## 2017-01-01 DIAGNOSIS — O0993 Supervision of high risk pregnancy, unspecified, third trimester: Secondary | ICD-10-CM

## 2017-01-01 DIAGNOSIS — O0990 Supervision of high risk pregnancy, unspecified, unspecified trimester: Secondary | ICD-10-CM

## 2017-01-01 NOTE — Progress Notes (Signed)
Patient reports no concerns today 

## 2017-01-01 NOTE — Progress Notes (Signed)
Subjective:  Veronica Green is a 36 y.o. [id number] at [redacted]w[redacted]d being seen today for ongoing prenatal care.  She is currently monitored for the following issues for this high-risk pregnancy and has Pelvic mass in female; Supervision of high risk pregnancy, antepartum; Unwanted fertility; [redacted] weeks gestation of pregnancy; Abnormal findings on antenatal screening; and Hereditary disease in family possibly affecting fetus, affecting management of mother, antepartum condition or complication, not applicable or unspecified fetus on her problem list.  Patient reports no complaints.  Contractions: Irregular. Vag. Bleeding: None.  Movement: Present. Denies leaking of fluid.   The following portions of the patient's history were reviewed and updated as appropriate: allergies, current medications, past family history, past medical history, past social history, past surgical history and problem list. Problem list updated.  Objective:   Vitals:   01/01/17 1026  BP: 101/67  Pulse: 78  Weight: 136 lb 6.4 oz (61.9 kg)    Fetal Status: Fetal Heart Rate (bpm): 140   Movement: Present     General:  Alert, oriented and cooperative. Patient is in no acute distress.  Skin: Skin is warm and dry. No rash noted.   Cardiovascular: Normal heart rate noted  Respiratory: Normal respiratory effort, no problems with respiration noted  Abdomen: Soft, gravid, appropriate for gestational age. Pain/Pressure: Present     Pelvic:  Cervical exam deferred        Extremities: Normal range of motion.  Edema: None  Mental Status: Normal mood and affect. Normal behavior. Normal judgment and thought content.   Urinalysis:      Assessment and Plan:  Pregnancy: U2P5361 at [redacted]w[redacted]d  1. Supervision of high risk pregnancy, antepartum   Term labor symptoms and general obstetric precautions including but not limited to vaginal bleeding, contractions, leaking of fluid and fetal movement were reviewed in detail with the patient. Please refer  to After Visit Summary for other counseling recommendations.  Return in about 1 week (around 01/08/2017) for ROB.   Shelly Bombard, MDPatient ID: Veronica Green, female   DOB: 1980/10/08, 36 y.o.   MRN: [id number]

## 2017-01-08 ENCOUNTER — Ambulatory Visit (INDEPENDENT_AMBULATORY_CARE_PROVIDER_SITE_OTHER): Payer: Medicaid Other | Admitting: Obstetrics

## 2017-01-08 ENCOUNTER — Encounter: Payer: Self-pay | Admitting: Obstetrics

## 2017-01-08 VITALS — BP 109/69 | HR 69 | Wt 136.0 lb

## 2017-01-08 DIAGNOSIS — Z09 Encounter for follow-up examination after completed treatment for conditions other than malignant neoplasm: Secondary | ICD-10-CM

## 2017-01-08 DIAGNOSIS — Z3009 Encounter for other general counseling and advice on contraception: Secondary | ICD-10-CM

## 2017-01-08 DIAGNOSIS — O0990 Supervision of high risk pregnancy, unspecified, unspecified trimester: Secondary | ICD-10-CM

## 2017-01-08 DIAGNOSIS — R1900 Intra-abdominal and pelvic swelling, mass and lump, unspecified site: Secondary | ICD-10-CM

## 2017-01-08 DIAGNOSIS — D270 Benign neoplasm of right ovary: Secondary | ICD-10-CM

## 2017-01-08 DIAGNOSIS — Z90721 Acquired absence of ovaries, unilateral: Secondary | ICD-10-CM

## 2017-01-08 DIAGNOSIS — O0993 Supervision of high risk pregnancy, unspecified, third trimester: Secondary | ICD-10-CM

## 2017-01-08 NOTE — Progress Notes (Signed)
Subjective:  Veronica Green is a 36 y.o. E2V3612 at [redacted]w[redacted]d being seen today for ongoing prenatal care.  She is currently monitored for the following issues for this high-risk pregnancy and has Pelvic mass in female; Supervision of high risk pregnancy, antepartum; Unwanted fertility; [redacted] weeks gestation of pregnancy; Abnormal findings on antenatal screening; and Hereditary disease in family possibly affecting fetus, affecting management of mother, antepartum condition or complication, not applicable or unspecified fetus on her problem list.  Patient reports occasional contractions.  Contractions: Irregular. Vag. Bleeding: None.  Movement: Present. Denies leaking of fluid.   The following portions of the patient's history were reviewed and updated as appropriate: allergies, current medications, past family history, past medical history, past social history, past surgical history and problem list. Problem list updated.  Objective:   Vitals:   01/08/17 1056  BP: 109/69  Pulse: 69  Weight: 136 lb (61.7 kg)    Fetal Status: Fetal Heart Rate (bpm): 140   Movement: Present     General:  Alert, oriented and cooperative. Patient is in no acute distress.  Skin: Skin is warm and dry. No rash noted.   Cardiovascular: Normal heart rate noted  Respiratory: Normal respiratory effort, no problems with respiration noted  Abdomen: Soft, gravid, appropriate for gestational age. Pain/Pressure: Present     Pelvic:  Cervical exam deferred        Extremities: Normal range of motion.  Edema: None  Mental Status: Normal mood and affect. Normal behavior. Normal judgment and thought content.   Urinalysis:      Assessment and Plan:  Pregnancy: A4S9753 at [redacted]w[redacted]d  1. Supervision of high risk pregnancy, antepartum  2. Unwanted fertility  3. Pelvic mass in female.  Large Luteoma of right ovary, with ascites.    4. S/P right oophorectomy at [redacted] weeks gestation, along with an umbilical hernia repair.   Term labor  symptoms and general obstetric precautions including but not limited to vaginal bleeding, contractions, leaking of fluid and fetal movement were reviewed in detail with the patient. Please refer to After Visit Summary for other counseling recommendations.  Return in about 1 week (around 01/15/2017) for [place].   Shelly Bombard, MD

## 2017-01-08 NOTE — Progress Notes (Signed)
Patient reports she is doing well- ready for labor

## 2017-01-09 ENCOUNTER — Encounter (HOSPITAL_COMMUNITY): Payer: Self-pay

## 2017-01-09 ENCOUNTER — Inpatient Hospital Stay (HOSPITAL_COMMUNITY)
Admission: AD | Admit: 2017-01-09 | Discharge: 2017-01-10 | DRG: 775 | Disposition: A | Discharge status: Home / Self Care | Payer: Medicaid Other | Source: Ambulatory Visit | Attending: Obstetrics and Gynecology | Admitting: Obstetrics and Gynecology

## 2017-01-09 DIAGNOSIS — O0990 Supervision of high risk pregnancy, unspecified, unspecified trimester: Secondary | ICD-10-CM

## 2017-01-09 DIAGNOSIS — Z87891 Personal history of nicotine dependence: Secondary | ICD-10-CM

## 2017-01-09 DIAGNOSIS — Z3A38 38 weeks gestation of pregnancy: Secondary | ICD-10-CM | POA: Diagnosis not present

## 2017-01-09 DIAGNOSIS — O9902 Anemia complicating childbirth: Principal | ICD-10-CM | POA: Diagnosis present

## 2017-01-09 DIAGNOSIS — Z3493 Encounter for supervision of normal pregnancy, unspecified, third trimester: Secondary | ICD-10-CM | POA: Diagnosis present

## 2017-01-09 DIAGNOSIS — D573 Sickle-cell trait: Secondary | ICD-10-CM | POA: Diagnosis present

## 2017-01-09 DIAGNOSIS — Z3A39 39 weeks gestation of pregnancy: Secondary | ICD-10-CM

## 2017-01-09 LAB — POCT FERN TEST: POCT FERN TEST: NEGATIVE

## 2017-01-09 LAB — RPR: RPR: NONREACTIVE

## 2017-01-09 LAB — CBC
HEMATOCRIT: 38.4 % (ref 36.0–46.0)
HEMOGLOBIN: 14.1 g/dL (ref 12.0–15.0)
MCH: 31.2 pg (ref 26.0–34.0)
MCHC: 36.7 g/dL — AB (ref 30.0–36.0)
MCV: 85 fL (ref 78.0–100.0)
Platelets: 310 10*3/uL (ref 150–400)
RBC: 4.52 MIL/uL (ref 3.87–5.11)
RDW: 14.5 % (ref 11.5–15.5)
WBC: 12.3 10*3/uL — AB (ref 4.0–10.5)

## 2017-01-09 LAB — TYPE AND SCREEN
ABO/RH(D): O POS
ANTIBODY SCREEN: NEGATIVE

## 2017-01-09 MED ORDER — WITCH HAZEL-GLYCERIN EX PADS: 1.0000 "application " | MEDICATED_PAD | CUTANEOUS | Status: DC | PRN

## 2017-01-09 MED ORDER — SOD CITRATE-CITRIC ACID 500-334 MG/5ML PO SOLN: 30.0000 mL | ORAL | Status: DC | PRN

## 2017-01-09 MED ORDER — ZOLPIDEM TARTRATE 5 MG PO TABS: 5.0000 mg | ORAL_TABLET | Freq: Every evening | ORAL | Status: DC | PRN

## 2017-01-09 MED ORDER — TETANUS-DIPHTH-ACELL PERTUSSIS 5-2.5-18.5 LF-MCG/0.5 IM SUSP: 0.5000 mL | Freq: Once | INTRAMUSCULAR | Status: DC

## 2017-01-09 MED ORDER — SENNOSIDES-DOCUSATE SODIUM 8.6-50 MG PO TABS
2.0000 | ORAL_TABLET | ORAL | Status: DC
  Administered 2017-01-09: 2 via ORAL
  Filled 2017-01-09: qty 2

## 2017-01-09 MED ORDER — SIMETHICONE 80 MG PO CHEW: 80.0000 mg | CHEWABLE_TABLET | ORAL | Status: DC | PRN

## 2017-01-09 MED ORDER — LACTATED RINGERS IV SOLN: INTRAVENOUS | Status: DC

## 2017-01-09 MED ORDER — ACETAMINOPHEN 325 MG PO TABS
650.0000 mg | ORAL_TABLET | ORAL | Status: DC | PRN
  Administered 2017-01-09: 650 mg via ORAL
  Filled 2017-01-09: qty 2

## 2017-01-09 MED ORDER — ACETAMINOPHEN 325 MG PO TABS: 650.0000 mg | ORAL_TABLET | ORAL | Status: DC | PRN

## 2017-01-09 MED ORDER — ONDANSETRON HCL 4 MG/2ML IJ SOLN
4.0000 mg | Freq: Four times a day (QID) | INTRAMUSCULAR | Status: DC | PRN
Start: 2017-01-09 — End: 2017-01-09

## 2017-01-09 MED ORDER — ONDANSETRON HCL 4 MG PO TABS: 4.0000 mg | ORAL_TABLET | ORAL | Status: DC | PRN

## 2017-01-09 MED ORDER — OXYCODONE-ACETAMINOPHEN 5-325 MG PO TABS
1.0000 | ORAL_TABLET | Freq: Four times a day (QID) | ORAL | Status: DC | PRN
  Administered 2017-01-09 – 2017-01-10 (×4): 1 via ORAL
  Filled 2017-01-09 (×4): qty 1

## 2017-01-09 MED ORDER — DIBUCAINE 1 % RE OINT: 1.0000 "application " | TOPICAL_OINTMENT | RECTAL | Status: DC | PRN

## 2017-01-09 MED ORDER — LACTATED RINGERS IV SOLN: 500.0000 mL | INTRAVENOUS | Status: DC | PRN

## 2017-01-09 MED ORDER — PRENATAL MULTIVITAMIN CH
1.0000 | ORAL_TABLET | Freq: Every day | ORAL | Status: DC
  Administered 2017-01-10: 1 via ORAL
  Filled 2017-01-09: qty 1

## 2017-01-09 MED ORDER — OXYCODONE-ACETAMINOPHEN 5-325 MG PO TABS: 2.0000 | ORAL_TABLET | ORAL | Status: DC | PRN

## 2017-01-09 MED ORDER — DIPHENHYDRAMINE HCL 25 MG PO CAPS: 25.0000 mg | ORAL_CAPSULE | Freq: Four times a day (QID) | ORAL | Status: DC | PRN

## 2017-01-09 MED ORDER — OXYTOCIN 40 UNITS IN LACTATED RINGERS INFUSION - SIMPLE MED
2.5000 [IU]/h | INTRAVENOUS | Status: DC
  Filled 2017-01-09: qty 1000

## 2017-01-09 MED ORDER — OXYTOCIN BOLUS FROM INFUSION
500.0000 mL | Freq: Once | INTRAVENOUS | Status: AC
  Administered 2017-01-09: 500 mL via INTRAVENOUS

## 2017-01-09 MED ORDER — LIDOCAINE HCL (PF) 1 % IJ SOLN
30.0000 mL | INTRAMUSCULAR | Status: DC | PRN
  Filled 2017-01-09: qty 30

## 2017-01-09 MED ORDER — COCONUT OIL OIL: 1.0000 "application " | TOPICAL_OIL | Status: DC | PRN

## 2017-01-09 MED ORDER — ONDANSETRON HCL 4 MG/2ML IJ SOLN: 4.0000 mg | INTRAMUSCULAR | Status: DC | PRN

## 2017-01-09 MED ORDER — IBUPROFEN 600 MG PO TABS
600.0000 mg | ORAL_TABLET | Freq: Four times a day (QID) | ORAL | Status: DC
  Administered 2017-01-09 – 2017-01-10 (×5): 600 mg via ORAL
  Filled 2017-01-09 (×5): qty 1

## 2017-01-09 MED ORDER — OXYCODONE-ACETAMINOPHEN 5-325 MG PO TABS: 1.0000 | ORAL_TABLET | ORAL | Status: DC | PRN

## 2017-01-09 MED ORDER — BENZOCAINE-MENTHOL 20-0.5 % EX AERO: 1.0000 "application " | INHALATION_SPRAY | CUTANEOUS | Status: DC | PRN

## 2017-01-09 MED ORDER — FLEET ENEMA 7-19 GM/118ML RE ENEM: 1.0000 | ENEMA | RECTAL | Status: DC | PRN

## 2017-01-09 NOTE — H&P (Signed)
Obstetric History and Physical  Genesee Nase is a 36 y.o. W7P7106 with IUP at [redacted]w[redacted]d presenting for advanced active labor. Patient states she has been having regular contractions, none vaginal bleeding, intact membranes, with active fetal movement.    Prenatal Course Source of Care: [place]  with onset of care at 12.6 weeks Dating: By LMP --->  Estimated Date of Delivery: 2/69/48 Pregnancy complications or risks: Patient Active Problem List   Diagnosis Date Noted  . Indication for care in labor or delivery 01/09/2017  . [redacted] weeks gestation of pregnancy   . Abnormal findings on antenatal screening   . Hereditary disease in family possibly affecting fetus, affecting management of mother, antepartum condition or complication, not applicable or unspecified fetus   . Unwanted fertility 08/11/2016  . Supervision of high risk pregnancy, antepartum 08/10/2016  . Pelvic mass in female 07/12/2016   She plans to breastfeed, plans to bottle feed She desires bilateral tubal ligation for postpartum contraception.   Prenatal labs and studies: ABO, Rh: O/Positive/-- (02/06 1414) Antibody: Negative (02/06 1414) Rubella: 2.05 (02/06 1414) RPR: Non Reactive (02/06 1414)  HBsAg: Negative (02/06 1414)  HIV:   Non-Reactive NIO:EVOJJKKX (07/13 1145) 1 hr Glucola  89 Genetic screening normal Anatomy US normal  Prenatal Transfer Tool  Maternal Diabetes: No Genetic Screening: Normal Maternal Ultrasounds/Referrals: Normal Fetal Ultrasounds or other Referrals:  None Maternal Substance Abuse:  No Significant Maternal Medications:  None Significant Maternal Lab Results: Lab values include: Group B Strep negative  Past Medical History:  Diagnosis Date  . Anxiety   . Ascites   . Sickle cell trait ([place])   . Umbilical hernia     Past Surgical History:  Procedure Laterality Date  . DILATION AND CURETTAGE OF UTERUS    . RIGHT OOPHORECTOMY  07/2016  . TUMOR REMOVAL  07/2016    OB History  Gravida  Para Term Preterm AB Living  6 4 4  0 1 4  SAB TAB Ectopic Multiple Live Births  0 1 0 0 4    # Outcome Date GA Lbr Len/2nd Weight Sex Delivery Anes PTL Lv  6 Current           5 Term 09/14/14 [redacted]w[redacted]d 12:30 / 00:14 7 lb 1.4 oz (3.215 kg) F Vag-Spont None  LIV  4 Term 05/06/11 [redacted]w[redacted]d 07:27 / 00:11 5 lb 14 oz (2.665 kg)  Vag-Spont None  LIV  3 Term 07/13/09     Vag-Spont  N LIV  2 TAB 2010 [redacted]w[redacted]d         1 Term 11/11/99     Vag-Spont  N LIV      Social History   Social History  . Marital status: Single    Spouse name: N/A  . Number of children: N/A  . Years of education: N/A   Social History Main Topics  . Smoking status: Former Smoker    Packs/day: 0.50    Types: Cigarettes    Quit date: 11/21/2015  . Smokeless tobacco: Never Used     Comment: unable to smoke last 2 wks  . Alcohol use No     Comment: occ  . Drug use: No  . Sexual activity: Yes    Birth control/ protection: None   Other Topics Concern  . None   Social History Narrative  . None    Family History  Problem Relation Age of Onset  . Cancer Maternal Grandmother   . Cancer Paternal Grandmother   . Asthma Mother   .  Multiple sclerosis Sister   . Anesthesia problems Neg Hx   . Hypotension Neg Hx   . Malignant hyperthermia Neg Hx   . Pseudochol deficiency Neg Hx     Prescriptions Prior to Admission  Medication Sig Dispense Refill Last Dose  . Doxylamine-Pyridoxine (DICLEGIS) 10-10 MG TBEC Take 2 tablets by mouth at bedtime. If symptoms persist add one tablet every morning starting on day 3. If symptoms persist add 1 tablet every afternoon on day 4. (Patient not taking: Reported on 07/13/2016) 60 tablet 3 Not Taking  . omeprazole (PRILOSEC) 20 MG capsule Take 1 capsule (20 mg total) by mouth 2 (two) times daily before a meal. 60 capsule 5 Taking  . ondansetron (ZOFRAN) 4 MG tablet Take 1 tablet (4 mg total) by mouth every 8 (eight) hours as needed for nausea or vomiting. (Patient not taking: Reported on  01/01/2017) 20 tablet 0 Not Taking  . Prenatal Vit-Fe Fumarate-FA (PREPLUS) 27-1 MG TABS Take 1 tablet by mouth daily. 30 tablet 13 Taking    No Known Allergies  Review of Systems: Negative except for what is mentioned in HPI.  Physical Exam: BP 122/78   Pulse 71   Temp 98.2 F (36.8 C) (Oral)   Resp 18   Ht 5\' 4"  (1.626 m)   Wt 136 lb (61.7 kg)   LMP 04/15/2016   SpO2 98%   BMI 23.34 kg/m  CONSTITUTIONAL: Well-developed, well-nourished female in no acute distress.  HENT:  Normocephalic, atraumatic, External right and left ear normal. Oropharynx is clear and moist EYES: Conjunctivae and EOM are normal. Pupils are equal, round, and reactive to light. No scleral icterus.  NECK: Normal range of motion, supple, no masses SKIN: Skin is warm and dry. No rash noted. Not diaphoretic. No erythema. No pallor. NEUROLOGIC: Alert and oriented to person, place, and time. Normal reflexes, muscle tone coordination. No cranial nerve deficit noted. PSYCHIATRIC: Normal mood and affect. Normal behavior. Normal judgment and thought content. CARDIOVASCULAR: Normal heart rate noted, regular rhythm RESPIRATORY: Effort and breath sounds normal, no problems with respiration noted ABDOMEN: Soft, nontender, nondistended, gravid. MUSCULOSKELETAL: Normal range of motion. No edema and no tenderness. 2+ distal pulses.  Dilation: 10 Dilation Complete Date: 01/09/17 Dilation Complete Time: 0749 Effacement (%): 100 Cervical Position: Middle Station: 0 Presentation: Vertex Exam by:: A Showfety RN Presentation: cephalic FHT:  Baseline rate 140 bpm   Variability moderate  Accelerations present   Decelerations variable Contractions: Every 2 mins   Pertinent Labs/Studies:   Results for orders placed or performed during the hospital encounter of 01/09/17 (from the past 24 hour(s))  POCT fern test     Status: None   Collection Time: 01/09/17  7:24 AM  Result Value Ref Range   POCT Fern Test Negative = intact  amniotic membranes   CBC     Status: Abnormal   Collection Time: 01/09/17  7:37 AM  Result Value Ref Range   WBC 12.3 (H) 4.0 - 10.5 K/uL   RBC 4.52 3.87 - 5.11 MIL/uL   Hemoglobin 14.1 12.0 - 15.0 g/dL   HCT 38.4 36.0 - 46.0 %   MCV 85.0 78.0 - 100.0 fL   MCH 31.2 26.0 - 34.0 pg   MCHC 36.7 (H) 30.0 - 36.0 g/dL   RDW 14.5 11.5 - 15.5 %   Platelets 310 150 - 400 K/uL    Assessment : Daleen Steinhaus is a 36 y.o. [id number] at [redacted]w[redacted]d being admitted for active labor.  Plan: Labor: Expectant management  FWB: Reassuring fetal heart tracing.  GBS negative Delivery plan: Hopeful for vaginal delivery  Luiz Blare, DO [facility], [place] 01/09/2017, 8:14 AM

## 2017-01-09 NOTE — Lactation Note (Signed)
This note was copied from a baby's chart. Lactation Consultation Note  Patient Name: Boy Ellissa Ayo [id number] Date: 01/09/2017 Reason for consult: Initial assessment    With this experienced breast feeding mom, and her term baby, now 37 hours old, and latched deeply when I walked in the room. Mom had baby latched in cradle hold,with strong, rhythmic suckles and visible swallows. Mom doe not plan on Bf after she returns to work. This is her 5th baby, and she always begin BF, and then switches to formula/ Mom knows to call for questions/conerns. Brief review of lactation services done.   Maternal Data Formula Feeding for Exclusion: Yes Has patient been taught Hand Expression?: Yes Does the patient have breastfeeding experience prior to this delivery?: Yes  Feeding Feeding Type: Breast Fed Length of feed: 15 min  LATCH Score Latch: Grasps breast easily, tongue down, lips flanged, rhythmical sucking.  Audible Swallowing: Spontaneous and intermittent  Type of Nipple: Everted at rest and after stimulation  Comfort (Breast/Nipple): Soft / non-tender  Hold (Positioning): No assistance needed to correctly position infant at breast.  LATCH Score: 10  Interventions Interventions: Breast feeding basics reviewed  Lactation Tools Discussed/Used     Consult Status Consult Status: Follow-up Date: 01/10/17 Follow-up type: In-patient    Tonna Corner 01/09/2017, 12:18 PM

## 2017-01-09 NOTE — MAU Note (Signed)
Patient brought directly from lobby to mau room.  Patient reporting contractions every 2-5 minutes. Started around 2am. ?LOF; clear; states was vomiting and unsure if urinated on self or if its her water +FM +bloody show

## 2017-01-10 MED ORDER — IBUPROFEN 600 MG PO TABS
600.0000 mg | ORAL_TABLET | Freq: Four times a day (QID) | ORAL | 0 refills | Status: DC
Start: 2017-01-10 — End: 2018-09-27

## 2017-01-10 NOTE — Discharge Summary (Signed)
OB Discharge Summary  Patient Name: Veronica Green DOB: 1981/04/20 MRN: [id number]  Date of admission: 01/09/2017 Delivering MD: Windell Moment   Date of discharge: 01/10/2017  Admitting diagnosis: 72WKS WATER BROKE CONTRACTION 2-4MINS  Intrauterine pregnancy: [redacted]w[redacted]d     Secondary diagnosis:Active Problems:   Indication for care in labor or delivery  Additional problems:none     Discharge diagnosis: Term Pregnancy Delivered                                                                     Post partum procedures:none  Augmentation: none  Complications: None  Hospital course:  Onset of Labor With Vaginal Delivery     36 y.o. yo [id number] at [redacted]w[redacted]d was admitted in Active Labor on 01/09/2017. Patient had an uncomplicated labor course as follows:  Membrane Rupture Time/Date: 7:57 AM ,01/09/2017   Intrapartum Procedures: Episiotomy: None [1]                                         Lacerations:  None [1]  Patient had a delivery of a Viable infant. 01/09/2017  Information for the patient's newborn:  Cerita, Rabelo [[id number]]  Delivery Method: Vaginal, Spontaneous Delivery (Filed from Delivery Summary)    Pateint had an uncomplicated postpartum course.  She is ambulating, tolerating a regular diet, passing flatus, and urinating well. Patient is discharged home in stable condition on 01/10/17.   Physical exam  Vitals:   01/09/17 1030 01/09/17 1430 01/09/17 2234 01/10/17 0459  BP: 104/71 97/63 110/70 109/69  Pulse: 69 62 67 63  Resp: 18 18 18 18   Temp: 98.2 F (36.8 C) (!) 97.5 F (36.4 C) 98.4 F (36.9 C) 98 F (36.7 C)  TempSrc: Oral Oral Oral Oral  SpO2: 100% 100%    Weight:      Height:       General: alert, cooperative and no distress Lochia: appropriate Uterine Fundus: firm Incision: N/A DVT Evaluation: No evidence of DVT seen on physical exam. Labs: Lab Results  Component Value Date   WBC 12.3 (H) 01/09/2017   HGB 14.1 01/09/2017   HCT 38.4 01/09/2017   MCV  85.0 01/09/2017   PLT 310 01/09/2017   CMP Latest Ref Rng & Units 12/29/2016  Glucose 65 - 99 mg/dL 92  BUN 6 - 20 mg/dL 8  Creatinine 0.44 - 1.00 mg/dL 0.41(L)  Sodium 135 - 145 mmol/L 133(L)  Potassium 3.5 - 5.1 mmol/L 3.6  Chloride 101 - 111 mmol/L 105  CO2 22 - 32 mmol/L 20(L)  Calcium 8.9 - 10.3 mg/dL 7.9(L)  Total Protein 6.5 - 8.1 g/dL 6.0(L)  Total Bilirubin 0.3 - 1.2 mg/dL 0.6  Alkaline Phos 38 - 126 U/L 106  AST 15 - 41 U/L 25  ALT 14 - 54 U/L 13(L)    Discharge instruction: per After Visit Summary and "Baby and Me Booklet".  After Visit Meds:  Allergies as of 01/10/2017   No Known Allergies     Medication List    STOP taking these medications   Doxylamine-Pyridoxine 10-10 MG Tbec Commonly known as:  DICLEGIS   ondansetron  4 MG tablet Commonly known as:  ZOFRAN     TAKE these medications   ibuprofen 600 MG tablet Commonly known as:  ADVIL,MOTRIN Take 1 tablet (600 mg total) by mouth every 6 (six) hours.   omeprazole 20 MG capsule Commonly known as:  PRILOSEC Take 1 capsule (20 mg total) by mouth 2 (two) times daily before a meal.   PREPLUS 27-1 MG Tabs Take 1 tablet by mouth daily.       Diet: routine diet  Activity: Advance as tolerated. Pelvic rest for 6 weeks.   Outpatient follow up:6 weeks Follow up Appt:Future Appointments Date Time Provider [place]  01/15/2017 9:45 AM Shelly Bombard, MD [place] None   Follow up visit: No Follow-up on file.  Postpartum contraception: Depo Provera  Newborn Data: Live born female  Birth Weight: 6 lb 7.7 oz (2940 g) APGAR: 9, 9  Baby Feeding: Breast Disposition:home with mother   01/10/2017 Koren Shiver, CNM

## 2017-01-15 ENCOUNTER — Encounter: Payer: Medicaid Other | Admitting: Obstetrics

## 2017-02-04 NOTE — Progress Notes (Signed)
Post discharge chart review completed.  

## 2017-02-05 ENCOUNTER — Encounter: Payer: Self-pay | Admitting: Obstetrics

## 2017-02-05 ENCOUNTER — Ambulatory Visit (INDEPENDENT_AMBULATORY_CARE_PROVIDER_SITE_OTHER): Payer: Medicaid Other | Admitting: Obstetrics

## 2017-02-05 VITALS — BP 102/69 | HR 79 | Wt 119.0 lb

## 2017-02-05 DIAGNOSIS — Z3042 Encounter for surveillance of injectable contraceptive: Secondary | ICD-10-CM | POA: Diagnosis not present

## 2017-02-05 DIAGNOSIS — Z30013 Encounter for initial prescription of injectable contraceptive: Secondary | ICD-10-CM

## 2017-02-05 DIAGNOSIS — Z3009 Encounter for other general counseling and advice on contraception: Secondary | ICD-10-CM

## 2017-02-05 MED ORDER — MEDROXYPROGESTERONE ACETATE 150 MG/ML IM SUSP
150.0000 mg | Freq: Once | INTRAMUSCULAR | Status: AC
  Administered 2017-02-05: 150 mg via INTRAMUSCULAR

## 2017-02-05 MED ORDER — MEDROXYPROGESTERONE ACETATE 150 MG/ML IM SUSP: 150.0000 mg | INTRAMUSCULAR | 4 refills | Status: DC

## 2017-02-05 NOTE — Progress Notes (Signed)
Post Partum Exam  Veronica Green is a 36 y.o. S9P5300 female who presents for a postpartum visit. She is 3 weeks postpartum following a spontaneous vaginal delivery. I have fully reviewed the prenatal and intrapartum course. The delivery was at 16 gestational weeks.  Anesthesia: none. Postpartum course has been normal. Baby's course has been normal. Baby is feeding by bottle - Similac Advance. Bleeding no bleeding. Bowel function is normal. Bladder function is normal. Patient is not sexually active. Contraception method is abstinence. Postpartum depression screening:neg  The following portions of the patient's history were reviewed and updated as appropriate: allergies, current medications, past family history, past medical history, past social history, past surgical history and problem list.  Review of Systems A comprehensive review of systems was negative.    Objective:  Bottle feeding  Assessment:   1. Postpartum care following vaginal delivery - doing well  2. Encounter for other general counseling and advice on contraception - wants Depo  3. Encounter for initial prescription of injectable contraceptive Rx: - medroxyPROGESTERone (DEPO-PROVERA) 150 MG/ML injection; Inject 1 mL (150 mg total) into the muscle every 3 (three) months.  Dispense: 1 mL; Refill: 4  Plan:   1. Contraception: Depo-Provera injections 2. Depo Provera Rx 3. Follow up in: 3 weeks or as needed.

## 2017-02-05 NOTE — Patient Instructions (Addendum)
Postpartum Depression and Baby Blues The postpartum period begins right after the birth of a baby. During this time, there is often a great amount of joy and excitement. It is also a time of many changes in the life of the parents. Regardless of how many times a mother gives birth, each child brings new challenges and dynamics to the family. It is not unusual to have feelings of excitement along with confusing shifts in moods, emotions, and thoughts. All mothers are at risk of developing postpartum depression or the "baby blues." These mood changes can occur right after giving birth, or they may occur many months after giving birth. The baby blues or postpartum depression can be mild or severe. Additionally, postpartum depression can go away rather quickly, or it can be a long-term condition. What are the causes? Raised hormone levels and the rapid drop in those levels are thought to be a main cause of postpartum depression and the baby blues. A number of hormones change during and after pregnancy. Estrogen and progesterone usually decrease right after the delivery of your baby. The levels of thyroid hormone and various cortisol steroids also rapidly drop. Other factors that play a role in these mood changes include major life events and genetics. What increases the risk? If you have any of the following risks for the baby blues or postpartum depression, know what symptoms to watch out for during the postpartum period. Risk factors that may increase the likelihood of getting the baby blues or postpartum depression include:  Having a personal or family history of depression.  Having depression while being pregnant.  Having premenstrual mood issues or mood issues related to oral contraceptives.  Having a lot of life stress.  Having marital conflict.  Lacking a social support network.  Having a baby with special needs.  Having health problems, such as diabetes.  What are the signs or  symptoms? Symptoms of baby blues include:  Brief changes in mood, such as going from extreme happiness to sadness.  Decreased concentration.  Difficulty sleeping.  Crying spells, tearfulness.  Irritability.  Anxiety.  Symptoms of postpartum depression typically begin within the first month after giving birth. These symptoms include:  Difficulty sleeping or excessive sleepiness.  Marked weight loss.  Agitation.  Feelings of worthlessness.  Lack of interest in activity or food.  Postpartum psychosis is a very serious condition and can be dangerous. Fortunately, it is rare. Displaying any of the following symptoms is cause for immediate medical attention. Symptoms of postpartum psychosis include:  Hallucinations and delusions.  Bizarre or disorganized behavior.  Confusion or disorientation.  How is this diagnosed? A diagnosis is made by an evaluation of your symptoms. There are no medical or lab tests that lead to a diagnosis, but there are various questionnaires that a health care provider may use to identify those with the baby blues, postpartum depression, or psychosis. Often, a screening tool called the [place] Postnatal Depression Scale is used to diagnose depression in the postpartum period. How is this treated? The baby blues usually goes away on its own in 1-2 weeks. Social support is often all that is needed. You will be encouraged to get adequate sleep and rest. Occasionally, you may be given medicines to help you sleep. Postpartum depression requires treatment because it can last several months or longer if it is not treated. Treatment may include individual or group therapy, medicine, or both to address any social, physiological, and psychological factors that may play a role in the  requires treatment because it can last several months or longer if it is not treated. Treatment may include individual or group therapy, medicine, or both to address any social, physiological, and psychological factors that may play a role in the depression. Regular exercise, a healthy diet, rest, and social support may also be strongly recommended.  Postpartum psychosis is more serious and needs treatment right away.  Hospitalization is often needed.  Follow these instructions at home:   Get as much rest as you can. Nap when the baby sleeps.   Exercise regularly. Some women find yoga and walking to be beneficial.   Eat a balanced and nourishing diet.   Do little things that you enjoy. Have a cup of tea, take a bubble bath, read your favorite magazine, or listen to your favorite music.   Avoid alcohol.   Ask for help with household chores, cooking, grocery shopping, or running errands as needed. Do not try to do everything.   Talk to people close to you about how you are feeling. Get support from your partner, family members, friends, or other new moms.   Try to stay positive in how you think. Think about the things you are grateful for.   Do not spend a lot of time alone.   Only take over-the-counter or prescription medicine as directed by your health care provider.   Keep all your postpartum appointments.   Let your health care provider know if you have any concerns.  Contact a health care provider if:  You are having a reaction to or problems with your medicine.  Get help right away if:   You have suicidal feelings.   You think you may harm the baby or someone else.  This information is not intended to replace advice given to you by your health care provider. Make sure you discuss any questions you have with your health care provider.  Document Released: 02/27/2004 Document Revised: 10/31/2015 Document Reviewed: 03/06/2013  Elsevier Interactive Patient Education  2017 [employer]  Postpartum Care After Vaginal Delivery  The period of time right after you deliver your newborn is called the postpartum period.  What kind of medical care will I receive?   You may continue to receive fluids and medicines through an IV tube inserted into one of your veins.   If an incision was made near your vagina (episiotomy) or if you had some vaginal tearing during delivery, cold compresses may be placed on your episiotomy or  your tear. This helps to reduce pain and swelling.   You may be given a squirt bottle to use when you go to the bathroom. You may use this until you are comfortable wiping as usual. To use the squirt bottle, follow these steps:  ? Before you urinate, fill the squirt bottle with warm water. Do not use hot water.  ? After you urinate, while you are sitting on the toilet, use the squirt bottle to rinse the area around your urethra and vaginal opening. This rinses away any urine and blood.  ? You may do this instead of wiping. As you start healing, you may use the squirt bottle before wiping yourself. Make sure to wipe gently.  ? Fill the squirt bottle with clean water every time you use the bathroom.   You will be given sanitary pads to wear.  How can I expect to feel?   You may not feel the need to urinate for several hours after delivery.   You will have   some soreness and pain in your abdomen and vagina.   If you are breastfeeding, you may have uterine contractions every time you breastfeed for up to several weeks postpartum. Uterine contractions help your uterus return to its normal size.   It is normal to have vaginal bleeding (lochia) after delivery. The amount and appearance of lochia is often similar to a menstrual period in the first week after delivery. It will gradually decrease over the next few weeks to a dry, yellow-Graff discharge. For most women, lochia stops completely by 6-8 weeks after delivery. Vaginal bleeding can vary from woman to woman.   Within the first few days after delivery, you may have breast engorgement. This is when your breasts feel heavy, full, and uncomfortable. Your breasts may also throb and feel hard, tightly stretched, warm, and tender. After this occurs, you may have milk leaking from your breasts.Your health care provider can help you relieve discomfort due to breast engorgement. Breast engorgement should go away within a few days.   You may feel more sad or worried  than normal due to hormonal changes after delivery. These feelings should not last more than a few days. If these feelings do not go away after several days, speak with your health care provider.  How should I care for myself?   Tell your health care provider if you have pain or discomfort.   Drink enough water to keep your urine clear or pale yellow.   Wash your hands thoroughly with soap and water for at least 20 seconds after changing your sanitary pads, after using the toilet, and before holding or feeding your baby.   If you are not breastfeeding, avoid touching your breasts a lot. Doing this can make your breasts produce more milk.   If you become weak or lightheaded, or you feel like you might faint, ask for help before:  ? Getting out of bed.  ? Showering.   Change your sanitary pads frequently. Watch for any changes in your flow, such as a sudden increase in volume, a change in color, the passing of large blood clots. If you pass a blood clot from your vagina, save it to show to your health care provider. Do not flush blood clots down the toilet without having your health care provider look at them.   Make sure that all your vaccinations are up to date. This can help protect you and your baby from getting certain diseases. You may need to have immunizations done before you leave the hospital.   If desired, talk with your health care provider about methods of family planning or birth control (contraception).  How can I start bonding with my baby?  Spending as much time as possible with your baby is very important. During this time, you and your baby can get to know each other and develop a bond. Having your baby stay with you in your room (rooming in) can give you time to get to know your baby. Rooming in can also help you become comfortable caring for your baby. Breastfeeding can also help you bond with your baby.  How can I plan for returning home with my baby?   Make sure that you have a car seat  installed in your vehicle.  ? Your car seat should be checked by a certified car seat installer to make sure that it is installed safely.  ? Make sure that your baby fits into the car seat safely.   Ask your health care

## 2017-02-05 NOTE — Addendum Note (Signed)
Addended by: Valli Glance F on: 02/05/2017 11:35 AM   Modules accepted: Orders

## 2017-02-26 ENCOUNTER — Ambulatory Visit: Payer: Self-pay | Admitting: Obstetrics

## 2017-04-26 ENCOUNTER — Ambulatory Visit: Payer: Self-pay

## 2017-04-27 ENCOUNTER — Telehealth: Payer: Self-pay | Admitting: *Deleted

## 2017-04-27 NOTE — Telephone Encounter (Signed)
Lft msg for patient to call back and reschedule or let us know her plans.

## 2017-06-19 ENCOUNTER — Other Ambulatory Visit: Payer: Self-pay

## 2017-06-19 ENCOUNTER — Ambulatory Visit (HOSPITAL_COMMUNITY)
Admission: EM | Admit: 2017-06-19 | Discharge: 2017-06-19 | Disposition: A | Discharge status: Home / Self Care | Payer: Self-pay | Attending: Family Medicine | Admitting: Family Medicine

## 2017-06-19 ENCOUNTER — Encounter (HOSPITAL_COMMUNITY): Payer: Self-pay | Admitting: Emergency Medicine

## 2017-06-19 DIAGNOSIS — Z3202 Encounter for pregnancy test, result negative: Secondary | ICD-10-CM

## 2017-06-19 DIAGNOSIS — Z202 Contact with and (suspected) exposure to infections with a predominantly sexual mode of transmission: Secondary | ICD-10-CM

## 2017-06-19 DIAGNOSIS — R102 Pelvic and perineal pain: Secondary | ICD-10-CM

## 2017-06-19 DIAGNOSIS — Z87891 Personal history of nicotine dependence: Secondary | ICD-10-CM | POA: Insufficient documentation

## 2017-06-19 DIAGNOSIS — R1030 Lower abdominal pain, unspecified: Secondary | ICD-10-CM

## 2017-06-19 LAB — POCT URINALYSIS DIP (DEVICE)
GLUCOSE, UA: NEGATIVE mg/dL
KETONES UR: 40 mg/dL — AB
Leukocytes, UA: NEGATIVE
Nitrite: NEGATIVE
PH: 6 (ref 5.0–8.0)
PROTEIN: NEGATIVE mg/dL
UROBILINOGEN UA: 1 mg/dL (ref 0.0–1.0)

## 2017-06-19 LAB — POCT PREGNANCY, URINE: Preg Test, Ur: NEGATIVE

## 2017-06-19 MED ORDER — METRONIDAZOLE 500 MG PO TABS: 500.0000 mg | ORAL_TABLET | Freq: Two times a day (BID) | ORAL | 0 refills | Status: DC

## 2017-06-19 NOTE — ED Provider Notes (Signed)
[facility]   [id number] 06/19/17 Arrival Time: 1407   SUBJECTIVE:  Veronica Green is a 37 y.o. female who presents to the urgent care with complaint of Abdominal pain onset 2 weeks ago.  Pain in lower abdominal area, crampy feeling.  Denies discharge .minimal pain with urination.    Delivered 5 months ago.  Due for DepoProvera shot last month but could not afford it.  Partner recently incarcerated but admitted being treated for trichamonas and infidelity.   Past Medical History:  Diagnosis Date  . Anxiety   . Ascites   . Sickle cell trait ([place])   . Umbilical hernia    Family History  Problem Relation Age of Onset  . Cancer Maternal Grandmother   . Cancer Paternal Grandmother   . Asthma Mother   . Multiple sclerosis Sister   . Anesthesia problems Neg Hx   . Hypotension Neg Hx   . Malignant hyperthermia Neg Hx   . Pseudochol deficiency Neg Hx    Social History   Socioeconomic History  . Marital status: Single    Spouse name: Not on file  . Number of children: Not on file  . Years of education: Not on file  . Highest education level: Not on file  Social Needs  . Financial resource strain: Not on file  . Food insecurity - worry: Not on file  . Food insecurity - inability: Not on file  . Transportation needs - medical: Not on file  . Transportation needs - non-medical: Not on file  Occupational History  . Not on file  Tobacco Use  . Smoking status: Former Smoker    Packs/day: 0.50    Types: Cigarettes    Last attempt to quit: 11/21/2015    Years since quitting: 1.5  . Smokeless tobacco: Never Used  . Tobacco comment: unable to smoke last 2 wks  Substance and Sexual Activity  . Alcohol use: No    Comment: occ  . Drug use: No  . Sexual activity: Not Currently    Partners: Male    Birth control/protection: Abstinence  Other Topics Concern  . Not on file  Social History Narrative  . Not on file   No outpatient medications have been marked as taking  for the 06/19/17 encounter [facility] Encounter).   No Known Allergies    ROS: As per HPI, remainder of ROS negative.   OBJECTIVE:   Vitals:   06/19/17 1445  BP: 102/74  Pulse: 72  Resp: 18  Temp: 98.1 F (36.7 C)  TempSrc: Oral  SpO2: 99%     General appearance: alert; no distress Eyes: PERRL; EOMI; conjunctiva normal HENT: normocephalic; atraumatic; TMs normal, canal normal, external ears normal without trauma; nasal mucosa normal; oral mucosa normal Neck: supple Lungs: clear to auscultation bilaterally Heart: regular rate and rhythm Abdomen: soft, non-tender; bowel sounds normal; no masses or organomegaly; no guarding or rebound tenderness Back: no CVA tenderness Extremities: no cyanosis or edema; symmetrical with no gross deformities Skin: warm and dry Neurologic: normal gait; grossly normal Psychological: alert and cooperative; normal mood and affect      Labs:  Results for orders placed or performed during the hospital encounter of 06/19/17  POCT urinalysis dip (device)  Result Value Ref Range   Glucose, UA NEGATIVE NEGATIVE mg/dL   Bilirubin Urine SMALL (A) NEGATIVE   Ketones, ur 40 (A) NEGATIVE mg/dL   Specific Gravity, Urine >=1.030 1.005 - 1.030   Hgb urine dipstick TRACE (A) NEGATIVE   pH  6.0 5.0 - 8.0   Protein, ur NEGATIVE NEGATIVE mg/dL   Urobilinogen, UA 1.0 0.0 - 1.0 mg/dL   Nitrite NEGATIVE NEGATIVE   Leukocytes, UA NEGATIVE NEGATIVE  Pregnancy, urine POC  Result Value Ref Range   Preg Test, Ur NEGATIVE NEGATIVE    Labs Reviewed  POCT URINALYSIS DIP (DEVICE) - Abnormal; Notable for the following components:      Result Value   Bilirubin Urine SMALL (*)    Ketones, ur 40 (*)    Hgb urine dipstick TRACE (*)    All other components within normal limits  POCT PREGNANCY, URINE  URINE CYTOLOGY ANCILLARY ONLY    No results found.     ASSESSMENT & PLAN:  1. Pelvic pain in female   2. Exposure to STD     Meds ordered this  encounter  Medications  . metroNIDAZOLE (FLAGYL) 500 MG tablet    Sig: Take 1 tablet (500 mg total) by mouth 2 (two) times daily.    Dispense:  14 tablet    Refill:  0    Reviewed expectations re: course of current medical issues. Questions answered. Outlined signs and symptoms indicating need for more acute intervention. Patient verbalized understanding. After Visit Summary given.    Procedures:      Robyn Haber, MD 06/19/17 1514

## 2017-06-19 NOTE — Discharge Instructions (Signed)
We are treating you for trichamonas because of the exposure.   No sign of urinary infection, but trichamonas can give these symptoms

## 2017-06-19 NOTE — ED Triage Notes (Signed)
Abdominal pain onset 2 weeks ago.  Pain in lower abdominal area, crampy feeling.  Denies discharge .minimal pain with urination.

## 2017-06-19 NOTE — ED Notes (Signed)
Patient sent to bathroom to obtain a dirty and clean specimen.

## 2017-06-21 LAB — URINE CYTOLOGY ANCILLARY ONLY
Chlamydia: NEGATIVE
Neisseria Gonorrhea: NEGATIVE
Trichomonas: NEGATIVE

## 2017-07-10 ENCOUNTER — Encounter (HOSPITAL_COMMUNITY): Payer: Self-pay | Admitting: Nurse Practitioner

## 2017-07-10 ENCOUNTER — Emergency Department (HOSPITAL_COMMUNITY)
Admission: EM | Admit: 2017-07-10 | Discharge: 2017-07-10 | Disposition: A | Discharge status: Home / Self Care | Payer: Self-pay | Attending: Emergency Medicine | Admitting: Emergency Medicine

## 2017-07-10 DIAGNOSIS — R1033 Periumbilical pain: Secondary | ICD-10-CM | POA: Insufficient documentation

## 2017-07-10 DIAGNOSIS — R1030 Lower abdominal pain, unspecified: Secondary | ICD-10-CM | POA: Insufficient documentation

## 2017-07-10 DIAGNOSIS — Z87891 Personal history of nicotine dependence: Secondary | ICD-10-CM | POA: Insufficient documentation

## 2017-07-10 DIAGNOSIS — R112 Nausea with vomiting, unspecified: Secondary | ICD-10-CM | POA: Insufficient documentation

## 2017-07-10 LAB — CBC WITH DIFFERENTIAL/PLATELET
BASOS ABS: 0 10*3/uL (ref 0.0–0.1)
Basophils Relative: 0 %
Eosinophils Absolute: 0 10*3/uL (ref 0.0–0.7)
Eosinophils Relative: 0 %
HEMATOCRIT: 39.8 % (ref 36.0–46.0)
HEMOGLOBIN: 14.6 g/dL (ref 12.0–15.0)
LYMPHS PCT: 9 %
Lymphs Abs: 1.6 10*3/uL (ref 0.7–4.0)
MCH: 31.3 pg (ref 26.0–34.0)
MCHC: 36.7 g/dL — ABNORMAL HIGH (ref 30.0–36.0)
MCV: 85.4 fL (ref 78.0–100.0)
MONOS PCT: 2 %
Monocytes Absolute: 0.3 10*3/uL (ref 0.1–1.0)
NEUTROS ABS: 15.5 10*3/uL — AB (ref 1.7–7.7)
NEUTROS PCT: 89 %
Platelets: 341 10*3/uL (ref 150–400)
RBC: 4.66 MIL/uL (ref 3.87–5.11)
RDW: 12.4 % (ref 11.5–15.5)
WBC: 17.4 10*3/uL — AB (ref 4.0–10.5)

## 2017-07-10 LAB — URINALYSIS, ROUTINE W REFLEX MICROSCOPIC
BACTERIA UA: NONE SEEN
BILIRUBIN URINE: NEGATIVE
Glucose, UA: NEGATIVE mg/dL
HGB URINE DIPSTICK: NEGATIVE
KETONES UR: 80 mg/dL — AB
Leukocytes, UA: NEGATIVE
NITRITE: NEGATIVE
Protein, ur: 30 mg/dL — AB
Specific Gravity, Urine: 1.028 (ref 1.005–1.030)
pH: 5 (ref 5.0–8.0)

## 2017-07-10 LAB — COMPREHENSIVE METABOLIC PANEL
ALT: 22 U/L (ref 14–54)
ANION GAP: 13 (ref 5–15)
AST: 36 U/L (ref 15–41)
Albumin: 4.8 g/dL (ref 3.5–5.0)
Alkaline Phosphatase: 51 U/L (ref 38–126)
BUN: 15 mg/dL (ref 6–20)
CHLORIDE: 108 mmol/L (ref 101–111)
CO2: 20 mmol/L — ABNORMAL LOW (ref 22–32)
Calcium: 9 mg/dL (ref 8.9–10.3)
Creatinine, Ser: 0.64 mg/dL (ref 0.44–1.00)
GFR calc Af Amer: >60 mL/min (ref >60)
Glucose, Bld: 109 mg/dL — ABNORMAL HIGH (ref 65–99)
POTASSIUM: 3.1 mmol/L — AB (ref 3.5–5.1)
Sodium: 141 mmol/L (ref 135–145)
Total Bilirubin: 0.8 mg/dL (ref 0.3–1.2)
Total Protein: 7.8 g/dL (ref 6.5–8.1)

## 2017-07-10 LAB — WET PREP, GENITAL
Clue Cells Wet Prep HPF POC: NONE SEEN
Sperm: NONE SEEN
TRICH WET PREP: NONE SEEN
YEAST WET PREP: NONE SEEN

## 2017-07-10 LAB — LIPASE, BLOOD: LIPASE: 19 U/L (ref 11–51)

## 2017-07-10 LAB — I-STAT BETA HCG BLOOD, ED (MC, WL, AP ONLY)

## 2017-07-10 MED ORDER — ONDANSETRON HCL 4 MG/2ML IJ SOLN
4.0000 mg | Freq: Once | INTRAMUSCULAR | Status: AC
  Administered 2017-07-10: 4 mg via INTRAVENOUS
  Filled 2017-07-10: qty 2

## 2017-07-10 MED ORDER — POTASSIUM CHLORIDE CRYS ER 20 MEQ PO TBCR
40.0000 meq | EXTENDED_RELEASE_TABLET | Freq: Once | ORAL | Status: AC
  Administered 2017-07-10: 40 meq via ORAL
  Filled 2017-07-10: qty 2

## 2017-07-10 MED ORDER — ONDANSETRON 4 MG PO TBDP: 4.0000 mg | ORAL_TABLET | Freq: Three times a day (TID) | ORAL | 0 refills | Status: DC | PRN

## 2017-07-10 MED ORDER — CAPSAICIN 0.025 % EX CREA
TOPICAL_CREAM | Freq: Once | CUTANEOUS | Status: AC
  Administered 2017-07-10: 18:00:00 via TOPICAL
  Filled 2017-07-10: qty 60

## 2017-07-10 MED ORDER — MORPHINE SULFATE (PF) 4 MG/ML IV SOLN
4.0000 mg | Freq: Once | INTRAVENOUS | Status: AC
  Administered 2017-07-10: 4 mg via INTRAVENOUS
  Filled 2017-07-10: qty 1

## 2017-07-10 MED ORDER — SODIUM CHLORIDE 0.9 % IV BOLUS (SEPSIS)
1000.0000 mL | Freq: Once | INTRAVENOUS | Status: AC
Start: 2017-07-10 — End: 2017-07-10
  Administered 2017-07-10: 1000 mL via INTRAVENOUS

## 2017-07-10 NOTE — ED Notes (Signed)
ED Provider at bedside. 

## 2017-07-10 NOTE — ED Notes (Signed)
Patient reports burning and redness to abdomen where cream was applied. PA made aware.

## 2017-07-10 NOTE — ED Triage Notes (Signed)
Pt is presented by medics actively vomiting, c/o abdominal pain, onset this morning.

## 2017-07-10 NOTE — ED Notes (Signed)
Patient given ice chips. 

## 2017-07-10 NOTE — ED Provider Notes (Signed)
[facility] DEPT Provider Note   CSN: [id number] Arrival date & time: 07/10/17  9794     History   Chief Complaint Chief Complaint  Patient presents with  . N/V/D  . Abdominal Pain    HPI Veronica Green is a 37 y.o. female is with history of recent uncomplicated vaginal delivery, right ovarian mass after oophorectomy here for evaluation of sudden onset, 10/10, constant, nonradiating, sharp lower abdominal pain onset when waking up this morning. Aggravating factors include palpation and "everything". No alleviating factors. Associated symptoms include nausea, nonbilious, nonbloody emesis, chills. He states the light in her room is making the pain worse. She had Brendolyn Patty for dinner late last night. She denies fevers, chest pain, shortness of breath, cough, cold symptoms, urinary symptoms, abnormal vaginal bleeding or discharge. Last menstrual period 1 month ago as expected. No history of PUD, IBD, IBS, diverticulitis. Denies heavy EtOH use or NSAID use. States she sometimes smokes marijuana but "I know it's not bad". Does not know when the last time she smoked marijuana was. No recent antibiotics.   HPI  Past Medical History:  Diagnosis Date  . Anxiety   . Ascites   . Sickle cell trait ([place])   . Umbilical hernia     Patient Active Problem List   Diagnosis Date Noted  . Indication for care in labor or delivery 01/09/2017  . [redacted] weeks gestation of pregnancy   . Abnormal findings on antenatal screening   . Hereditary disease in family possibly affecting fetus, affecting management of mother, antepartum condition or complication, not applicable or unspecified fetus   . Unwanted fertility 08/11/2016  . Supervision of high risk pregnancy, antepartum 08/10/2016  . Pelvic mass in female 07/12/2016    Past Surgical History:  Procedure Laterality Date  . DILATION AND CURETTAGE OF UTERUS    . RIGHT OOPHORECTOMY  07/2016  . TUMOR REMOVAL  07/2016    OB  History    Gravida Para Term Preterm AB Living   6 5 5  0 1 5   SAB TAB Ectopic Multiple Live Births   0 1 0 0 5       Home Medications    Prior to Admission medications   Medication Sig Start Date End Date Taking? Authorizing Provider  ibuprofen (ADVIL,MOTRIN) 600 MG tablet Take 1 tablet (600 mg total) by mouth every 6 (six) hours. 01/10/17   Keitha Butte, CNM  metroNIDAZOLE (FLAGYL) 500 MG tablet Take 1 tablet (500 mg total) by mouth 2 (two) times daily. 06/19/17   Robyn Haber, MD  ondansetron (ZOFRAN ODT) 4 MG disintegrating tablet Take 1 tablet (4 mg total) by mouth every 8 (eight) hours as needed for nausea or vomiting. 07/10/17   Kinnie Feil, PA-C  Prenatal Vit-Fe Fumarate-FA (PREPLUS) 27-1 MG TABS Take 1 tablet by mouth daily. 07/14/16   [name], MD    Family History Family History  Problem Relation Age of Onset  . Cancer Maternal Grandmother   . Cancer Paternal Grandmother   . Asthma Mother   . Multiple sclerosis Sister   . Anesthesia problems Neg Hx   . Hypotension Neg Hx   . Malignant hyperthermia Neg Hx   . Pseudochol deficiency Neg Hx     Social History Social History   Tobacco Use  . Smoking status: Former Smoker    Packs/day: 0.50    Types: Cigarettes    Last attempt to quit: 11/21/2015    Years since quitting:  1.6  . Smokeless tobacco: Never Used  . Tobacco comment: unable to smoke last 2 wks  Substance Use Topics  . Alcohol use: No    Comment: occ  . Drug use: No     Allergies   Patient has no known allergies.   Review of Systems Review of Systems  Constitutional: Positive for appetite change and chills.  Gastrointestinal: Positive for abdominal pain, nausea and vomiting.     Physical Exam Updated Vital Signs BP 98/69   Pulse (!) 56   Temp 98.2 F (36.8 C) (Oral)   Resp 18   SpO2 99%   Physical Exam  Constitutional: She is oriented to person, place, and time. She appears well-developed and well-nourished. No  distress.  NAD. Keeps eyes closed during exam.   HENT:  Head: Normocephalic and atraumatic.  Right Ear: External ear normal.  Left Ear: External ear normal.  Nose: Nose normal.  Moist mucous membranes  Eyes: Conjunctivae and EOM are normal. No scleral icterus.  Neck: Normal range of motion. Neck supple.  Cardiovascular: Normal rate, regular rhythm and normal heart sounds.  No murmur heard. Pulmonary/Chest: Effort normal and breath sounds normal. She has no wheezes.  Abdominal: Soft. Normal appearance. There is tenderness in the periumbilical area, suprapubic area and left lower quadrant.  Negative murphy's, mcburney's. No CVAT. No G/R/R.  Genitourinary:  Genitourinary Comments:  External genitalia normal without erythema, edema, tenderness, discharge or lesions.  No groin lymphadenopathy.  Vaginal mucosa, vault and cervix normal, pink without discharge or lesions.  No CMT. Non palpable adnexa.  Musculoskeletal: Normal range of motion. She exhibits no deformity.  Neurological: She is alert and oriented to person, place, and time.  Skin: Skin is warm and dry. Capillary refill takes less than 2 seconds.  Psychiatric: She has a normal mood and affect. Her behavior is normal. Judgment and thought content normal.  Nursing note and vitals reviewed.    ED Treatments / Results  Labs (all labs ordered are listed, but only abnormal results are displayed) Labs Reviewed  WET PREP, GENITAL - Abnormal; Notable for the following components:      Result Value   WBC, Wet Prep HPF POC FEW (*)    All other components within normal limits  COMPREHENSIVE METABOLIC PANEL - Abnormal; Notable for the following components:   Potassium 3.1 (*)    CO2 20 (*)    Glucose, Bld 109 (*)    All other components within normal limits  CBC WITH DIFFERENTIAL/PLATELET - Abnormal; Notable for the following components:   WBC 17.4 (*)    MCHC 36.7 (*)    Neutro Abs 15.5 (*)    All other components within normal  limits  URINALYSIS, ROUTINE W REFLEX MICROSCOPIC - Abnormal; Notable for the following components:   Ketones, ur 80 (*)    Protein, ur 30 (*)    Squamous Epithelial / LPF 0-5 (*)    All other components within normal limits  LIPASE, BLOOD  I-STAT BETA HCG BLOOD, ED ([facility], [facility], [facility] ONLY)  GC/CHLAMYDIA PROBE AMP ([place]) NOT AT [facility]    EKG  EKG Interpretation None       Radiology No results found.  Procedures Procedures (including critical care time)  Medications Ordered in ED Medications  ondansetron (ZOFRAN) injection 4 mg (4 mg Intravenous Given 07/10/17 1630)  morphine 4 MG/ML injection 4 mg (4 mg Intravenous Given 07/10/17 1630)  sodium chloride 0.9 % bolus 1,000 mL (0 mLs Intravenous Stopped 07/10/17 1740)  capsaicin (ZOSTRIX) 0.025 % cream ( Topical Given 07/10/17 1736)  potassium chloride SA (K-DUR,KLOR-CON) CR tablet 40 mEq (40 mEq Oral Given 07/10/17 1823)     Initial Impression / Assessment and Plan / ED Course  I have reviewed the triage vital signs and the nursing notes.  Pertinent labs & imaging results that were available during my care of the patient were reviewed by me and considered in my medical decision making (see chart for details).  Clinical Course as of Jul 10 2000  Sat Jul 10, 2017  1717 Reevaluated patient after her medicines, nausea has resolved, abdominal pain much improved. Pending labs.  [CG]  2000 Repeat abdominal exam benigng. Negative murphy's, mcburney's.   [CG]    Clinical Course User Index [CG] Kinnie Feil, PA-C    High suspicion for viral gastroenteritis. Also considering appendicitis, diverticulitis although less likely. She is vague about her use of marijuana, possibly marijuana-induced hyperemesis. No peritoneal signs on exam. No CVA tenderness. Suspicion is low for SBO, ovarian torsion. We'll get lab work, pelvic exam, and reassess.  Final Clinical Impressions(s) / ED Diagnoses   Pelvic exam as above, reassuring. Labs remarkable  for leukocytosis, hypokalemia, ketones in urine likely from mild dehydration/n/v. She was given 1 L IVF and oral hydration. No episodes of emesis in ED. She did not require repeat analgesia and repeat abdominal exam x 2 reassuring. Likely viral gastroenteritis. Will d/c with symptomatic management. Discussed return preacutions. Pt agreeable.  Final diagnoses:  Nausea and vomiting in adult    ED Discharge Orders        Ordered    ondansetron (ZOFRAN ODT) 4 MG disintegrating tablet  Every 8 hours PRN     07/10/17 1957       Arlean Hopping 07/10/17 Riley Lam, MD 07/10/17 [phone]

## 2017-07-10 NOTE — Discharge Instructions (Signed)
You were mildly dehydrated, otherwise your labs were normal. I suspect your symptoms were from recent food or virus.   Continue to drink fluids. Use zofran for nausea. Return for fevers, continued vomiting despite nausea medication, worsening abdominal pain, bloody diarrhea.

## 2017-07-12 LAB — GC/CHLAMYDIA PROBE AMP (~~LOC~~) NOT AT ARMC
CHLAMYDIA, DNA PROBE: NEGATIVE
NEISSERIA GONORRHEA: NEGATIVE

## 2018-09-27 ENCOUNTER — Encounter (HOSPITAL_COMMUNITY): Payer: Self-pay | Admitting: *Deleted

## 2018-09-27 ENCOUNTER — Other Ambulatory Visit: Payer: Self-pay

## 2018-09-27 ENCOUNTER — Inpatient Hospital Stay (HOSPITAL_COMMUNITY)
Admission: AD | Admit: 2018-09-27 | Discharge: 2018-09-27 | Disposition: A | Discharge status: Home / Self Care | Payer: Self-pay | Attending: Obstetrics and Gynecology | Admitting: Obstetrics and Gynecology

## 2018-09-27 DIAGNOSIS — Z809 Family history of malignant neoplasm, unspecified: Secondary | ICD-10-CM | POA: Insufficient documentation

## 2018-09-27 DIAGNOSIS — F419 Anxiety disorder, unspecified: Secondary | ICD-10-CM | POA: Insufficient documentation

## 2018-09-27 DIAGNOSIS — R824 Acetonuria: Secondary | ICD-10-CM | POA: Insufficient documentation

## 2018-09-27 DIAGNOSIS — Z87891 Personal history of nicotine dependence: Secondary | ICD-10-CM | POA: Insufficient documentation

## 2018-09-27 DIAGNOSIS — Z3A01 Less than 8 weeks gestation of pregnancy: Secondary | ICD-10-CM | POA: Insufficient documentation

## 2018-09-27 DIAGNOSIS — D573 Sickle-cell trait: Secondary | ICD-10-CM | POA: Insufficient documentation

## 2018-09-27 DIAGNOSIS — Z792 Long term (current) use of antibiotics: Secondary | ICD-10-CM | POA: Insufficient documentation

## 2018-09-27 DIAGNOSIS — O219 Vomiting of pregnancy, unspecified: Secondary | ICD-10-CM | POA: Insufficient documentation

## 2018-09-27 DIAGNOSIS — O9989 Other specified diseases and conditions complicating pregnancy, childbirth and the puerperium: Secondary | ICD-10-CM | POA: Insufficient documentation

## 2018-09-27 DIAGNOSIS — O99341 Other mental disorders complicating pregnancy, first trimester: Secondary | ICD-10-CM | POA: Insufficient documentation

## 2018-09-27 LAB — URINALYSIS, ROUTINE W REFLEX MICROSCOPIC
Bilirubin Urine: NEGATIVE
Glucose, UA: NEGATIVE mg/dL
Ketones, ur: 80 mg/dL — AB
Nitrite: NEGATIVE
Protein, ur: 30 mg/dL — AB
Specific Gravity, Urine: 1.025 (ref 1.005–1.030)
pH: 5 (ref 5.0–8.0)

## 2018-09-27 LAB — POCT PREGNANCY, URINE: Preg Test, Ur: POSITIVE — AB

## 2018-09-27 MED ORDER — LACTATED RINGERS IV BOLUS
1000.0000 mL | Freq: Once | INTRAVENOUS | Status: AC
  Administered 2018-09-27: 1000 mL via INTRAVENOUS

## 2018-09-27 MED ORDER — LACTATED RINGERS IV SOLN
Freq: Once | INTRAVENOUS | Status: AC
  Administered 2018-09-27: 14:00:00 via INTRAVENOUS
  Filled 2018-09-27: qty 5

## 2018-09-27 MED ORDER — PREPLUS 27-1 MG PO TABS: 1.0000 | ORAL_TABLET | Freq: Every day | ORAL | 13 refills | Status: DC

## 2018-09-27 MED ORDER — DOXYLAMINE SUCCINATE (SLEEP) 25 MG PO TABS: 25.0000 mg | ORAL_TABLET | Freq: Four times a day (QID) | ORAL | 2 refills | Status: DC | PRN

## 2018-09-27 MED ORDER — PYRIDOXINE HCL 25 MG PO TABS
25.0000 mg | ORAL_TABLET | Freq: Three times a day (TID) | ORAL | 0 refills | Status: DC
Start: 2018-09-27 — End: 2019-05-17

## 2018-09-27 MED ORDER — METOCLOPRAMIDE HCL 5 MG/ML IJ SOLN
10.0000 mg | Freq: Once | INTRAMUSCULAR | Status: AC
  Administered 2018-09-27: 13:00:00 10 mg via INTRAVENOUS
  Filled 2018-09-27: qty 2

## 2018-09-27 NOTE — MAU Note (Signed)
Been vomiting for the last 7 days.  Feeling a little dehydrated. Has not done a HPT, missed her menstrual

## 2018-09-27 NOTE — MAU Provider Note (Signed)
History     CSN: [id number]  Arrival date and time: 09/27/18 1119   First Provider Initiated Contact with Patient 09/27/18 1157      Chief Complaint  Patient presents with  . Emesis  . Possible Pregnancy   HPI Veronica Green is a 38 y.o. [id number] at [redacted]w[redacted]d who presents to MAU with chief complaint of "almost continuous" nausea and vomiting in early pregnancy. This is a new problem, onset one week ago. Patient cannot remember when she last tolerate solid food. She has not taken a home pregnancy test but reports her cycle is late.  She denies bleeding, abdominal pain, urinary symptoms, fever or recent illness.  OB History    Gravida  7   Para  5   Term  5   Preterm  0   AB  1   Living  5     SAB  0   TAB  1   Ectopic  0   Multiple  0   Live Births  5           Past Medical History:  Diagnosis Date  . Anxiety   . Ascites   . Sickle cell trait ([place])   . Umbilical hernia     Past Surgical History:  Procedure Laterality Date  . DILATION AND CURETTAGE OF UTERUS    . HERNIA REPAIR    . RIGHT OOPHORECTOMY  07/2016  . TUMOR REMOVAL  07/2016    Family History  Problem Relation Age of Onset  . Cancer Maternal Grandmother   . Cancer Paternal Grandmother   . Asthma Mother   . Multiple sclerosis Sister   . Anesthesia problems Neg Hx   . Hypotension Neg Hx   . Malignant hyperthermia Neg Hx   . Pseudochol deficiency Neg Hx     Social History   Tobacco Use  . Smoking status: Former Smoker    Packs/day: 0.50    Types: Cigarettes    Last attempt to quit: 11/21/2015    Years since quitting: 2.8  . Smokeless tobacco: Never Used  . Tobacco comment: unable to smoke last 2 wks  Substance Use Topics  . Alcohol use: No    Comment: occ  . Drug use: No    Allergies: No Known Allergies  Medications Prior to Admission  Medication Sig Dispense Refill Last Dose  . ibuprofen (ADVIL,MOTRIN) 600 MG tablet Take 1 tablet (600 mg total) by mouth every 6 (six) hours.  30 tablet 0 Unknown at Unknown time  . metroNIDAZOLE (FLAGYL) 500 MG tablet Take 1 tablet (500 mg total) by mouth 2 (two) times daily. 14 tablet 0   . ondansetron (ZOFRAN ODT) 4 MG disintegrating tablet Take 1 tablet (4 mg total) by mouth every 8 (eight) hours as needed for nausea or vomiting. 20 tablet 0   . Prenatal Vit-Fe Fumarate-FA (PREPLUS) 27-1 MG TABS Take 1 tablet by mouth daily. 30 tablet 13 Unknown at Unknown time    Review of Systems  Constitutional: Negative for activity change and appetite change.  Gastrointestinal: Positive for nausea and vomiting. Negative for abdominal pain.  Genitourinary: Negative for difficulty urinating, dyspareunia and flank pain.  Musculoskeletal: Negative for back pain.  Neurological: Negative for dizziness, syncope, weakness and headaches.  All other systems reviewed and are negative.  Physical Exam   Blood pressure 124/76, pulse 86, temperature 98.3 F (36.8 C), temperature source Oral, resp. rate 16, weight 48.3 kg, last menstrual period 08/29/2018, SpO2 100 %, not currently breastfeeding.  Physical Exam  Nursing note and vitals reviewed. Constitutional: She is oriented to person, place, and time. She appears well-developed and well-nourished.  Cardiovascular: Normal rate.  Respiratory: Effort normal.  GI: Soft. She exhibits no distension. There is no abdominal tenderness. There is no rebound, no guarding and no CVA tenderness.  Genitourinary:    Genitourinary Comments: Not evaluated based on chief complaint   Neurological: She is alert and oriented to person, place, and time.  Skin: Skin is warm and dry.  Psychiatric: She has a normal mood and affect. Her behavior is normal. Judgment and thought content normal.   MAU Course/MDM  Procedures  Patient Vitals for the past 24 hrs:  BP Temp Temp src Pulse Resp SpO2 Weight  09/27/18 1458 104/64 - - 88 18 100 % -  09/27/18 1126 124/76 98.3 F (36.8 C) Oral 86 16 100 % 48.3 kg    Results for  orders placed or performed during the hospital encounter of 09/27/18 (from the past 24 hour(s))  Pregnancy, urine POC     Status: Abnormal   Collection Time: 09/27/18 11:32 AM  Result Value Ref Range   Preg Test, Ur POSITIVE (A) NEGATIVE  Urinalysis, Routine w reflex microscopic     Status: Abnormal   Collection Time: 09/27/18 11:34 AM  Result Value Ref Range   Color, Urine YELLOW YELLOW   APPearance HAZY (A) CLEAR   Specific Gravity, Urine 1.025 1.005 - 1.030   pH 5.0 5.0 - 8.0   Glucose, UA NEGATIVE NEGATIVE mg/dL   Hgb urine dipstick SMALL (A) NEGATIVE   Bilirubin Urine NEGATIVE NEGATIVE   Ketones, ur 80 (A) NEGATIVE mg/dL   Protein, ur 30 (A) NEGATIVE mg/dL   Nitrite NEGATIVE NEGATIVE   Leukocytes,Ua TRACE (A) NEGATIVE   RBC / HPF 6-10 0 - 5 RBC/hpf   WBC, UA 6-10 0 - 5 WBC/hpf   Bacteria, UA FEW (A) NONE SEEN   Squamous Epithelial / LPF 0-5 0 - 5   Mucus PRESENT    Meds ordered this encounter  Medications  . metoCLOPramide (REGLAN) injection 10 mg  . lactated ringers bolus 1,000 mL  . multivitamins adult (INFUVITE ADULT) 10 mL in lactated ringers 1,000 mL infusion  . pyridOXINE (VITAMIN B-6) 25 MG tablet    Sig: Take 1 tablet (25 mg total) by mouth every 8 (eight) hours.    Dispense:  30 tablet    Refill:  0    Order Specific Question:   Supervising Provider    Answer:   Donnamae Jude [[id number]]  . doxylamine, Sleep, (UNISOM) 25 MG tablet    Sig: Take 1 tablet (25 mg total) by mouth 4 (four) times daily as needed (nausea and vomiting).    Dispense:  30 tablet    Refill:  2    Order Specific Question:   Supervising Provider    Answer:   Donnamae Jude [[id number]]  . Prenatal Vit-Fe Fumarate-FA (PREPLUS) 27-1 MG TABS    Sig: Take 1 tablet by mouth daily.    Dispense:  30 tablet    Refill:  13    Order Specific Question:   Supervising Provider    Answer:   Donnamae Jude [[id number]]   Assessment and Plan  --38 y.o. [id number] at [redacted]w[redacted]d by LMP --Ketonuria, s/p 2L IV fluids --N/V in  early pregnancy. Tolerating PO prior to discharge --Discharge home in stable condition, return to MAU for worsening acuity  F/U: Patient to establish prenatal care around 11  weeks [place], CNM 09/27/2018, 3:25 PM

## 2018-09-27 NOTE — Discharge Instructions (Signed)
Morning Sickness    Morning sickness is when you feel sick to your stomach (nauseous) during pregnancy. You may feel sick to your stomach and throw up (vomit). You may feel sick in the morning, but you can feel this way at any time of day. Some women feel very sick to their stomach and cannot stop throwing up (hyperemesis gravidarum).  Follow these instructions at home:  Medicines   Take over-the-counter and prescription medicines only as told by your doctor. Do not take any medicines until you talk with your doctor about them first.   Taking multivitamins before getting pregnant can stop or lessen the harshness of morning sickness.  Eating and drinking   Eat dry toast or crackers before getting out of bed.   Eat 5 or 6 small meals a day.   Eat dry and bland foods like rice and baked potatoes.   Do not eat greasy, fatty, or spicy foods.   Have someone cook for you if the smell of food causes you to feel sick or throw up.   If you feel sick to your stomach after taking prenatal vitamins, take them at night or with a snack.   Eat protein when you need a snack. Nuts, yogurt, and cheese are good choices.   Drink fluids throughout the day.   Try ginger ale made with real ginger, ginger tea made from fresh grated ginger, or ginger candies.  General instructions   Do not use any products that have nicotine or tobacco in them, such as cigarettes and e-cigarettes. If you need help quitting, ask your doctor.   Use an air purifier to keep the air in your house free of smells.   Get lots of fresh air.   Try to avoid smells that make you feel sick.   Try:  ? Wearing a bracelet that is used for seasickness (acupressure wristband).  ? Going to a doctor who puts thin needles into certain body points (acupuncture) to improve how you feel.  Contact a doctor if:   You need medicine to feel better.   You feel dizzy or light-headed.   You are losing weight.  Get help right away if:   You feel very sick to your  stomach and cannot stop throwing up.   You pass out (faint).   You have very bad pain in your belly.  Summary   Morning sickness is when you feel sick to your stomach (nauseous) during pregnancy.   You may feel sick in the morning, but you can feel this way at any time of day.   Making some changes to what you eat may help your symptoms go away.  This information is not intended to replace advice given to you by your health care provider. Make sure you discuss any questions you have with your health care provider.  Document Released: 07/02/2004 Document Revised: 06/25/2016 Document Reviewed: 06/25/2016  Elsevier Interactive Patient Education  2019 [employer]

## 2018-09-28 LAB — CULTURE, OB URINE

## 2018-10-07 ENCOUNTER — Inpatient Hospital Stay (HOSPITAL_COMMUNITY)
Admission: AD | Admit: 2018-10-07 | Discharge: 2018-10-07 | Disposition: A | Discharge status: Home / Self Care | Payer: Self-pay | Attending: Obstetrics and Gynecology | Admitting: Obstetrics and Gynecology

## 2018-10-07 ENCOUNTER — Other Ambulatory Visit: Payer: Self-pay

## 2018-10-07 ENCOUNTER — Encounter (HOSPITAL_COMMUNITY): Payer: Self-pay | Admitting: *Deleted

## 2018-10-07 DIAGNOSIS — Z3A01 Less than 8 weeks gestation of pregnancy: Secondary | ICD-10-CM

## 2018-10-07 DIAGNOSIS — Z87891 Personal history of nicotine dependence: Secondary | ICD-10-CM | POA: Insufficient documentation

## 2018-10-07 DIAGNOSIS — O219 Vomiting of pregnancy, unspecified: Secondary | ICD-10-CM

## 2018-10-07 DIAGNOSIS — F1291 Cannabis use, unspecified, in remission: Secondary | ICD-10-CM

## 2018-10-07 DIAGNOSIS — O99011 Anemia complicating pregnancy, first trimester: Secondary | ICD-10-CM | POA: Insufficient documentation

## 2018-10-07 DIAGNOSIS — R112 Nausea with vomiting, unspecified: Secondary | ICD-10-CM | POA: Insufficient documentation

## 2018-10-07 DIAGNOSIS — Z87898 Personal history of other specified conditions: Secondary | ICD-10-CM

## 2018-10-07 DIAGNOSIS — Z79899 Other long term (current) drug therapy: Secondary | ICD-10-CM | POA: Insufficient documentation

## 2018-10-07 DIAGNOSIS — R109 Unspecified abdominal pain: Secondary | ICD-10-CM | POA: Insufficient documentation

## 2018-10-07 DIAGNOSIS — O9989 Other specified diseases and conditions complicating pregnancy, childbirth and the puerperium: Secondary | ICD-10-CM | POA: Insufficient documentation

## 2018-10-07 DIAGNOSIS — D573 Sickle-cell trait: Secondary | ICD-10-CM | POA: Insufficient documentation

## 2018-10-07 DIAGNOSIS — Z809 Family history of malignant neoplasm, unspecified: Secondary | ICD-10-CM | POA: Insufficient documentation

## 2018-10-07 MED ORDER — PANTOPRAZOLE SODIUM 40 MG IV SOLR
40.0000 mg | Freq: Once | INTRAVENOUS | Status: AC
  Administered 2018-10-07: 13:00:00 40 mg via INTRAVENOUS
  Filled 2018-10-07: qty 40

## 2018-10-07 MED ORDER — M.V.I. ADULT IV INJ
Freq: Once | INTRAVENOUS | Status: AC
  Administered 2018-10-07: 14:00:00 via INTRAVENOUS
  Filled 2018-10-07 (×2): qty 10

## 2018-10-07 MED ORDER — LACTATED RINGERS IV BOLUS
1000.0000 mL | Freq: Once | INTRAVENOUS | Status: AC
  Administered 2018-10-07: 12:00:00 1000 mL via INTRAVENOUS

## 2018-10-07 MED ORDER — ONDANSETRON 4 MG PO TBDP
8.0000 mg | ORAL_TABLET | Freq: Once | ORAL | Status: DC
  Filled 2018-10-07: qty 2

## 2018-10-07 MED ORDER — METOCLOPRAMIDE HCL 5 MG/ML IJ SOLN
5.0000 mg | Freq: Once | INTRAMUSCULAR | Status: AC
  Administered 2018-10-07: 14:00:00 5 mg via INTRAVENOUS
  Filled 2018-10-07: qty 2

## 2018-10-07 MED ORDER — SCOPOLAMINE 1 MG/3DAYS TD PT72
1.0000 | MEDICATED_PATCH | Freq: Once | TRANSDERMAL | Status: DC
  Administered 2018-10-07: 14:00:00 1.5 mg via TRANSDERMAL
  Filled 2018-10-07: qty 1

## 2018-10-07 MED ORDER — PROMETHAZINE HCL 25 MG/ML IJ SOLN
25.0000 mg | Freq: Once | INTRAMUSCULAR | Status: AC
  Administered 2018-10-07: 13:00:00 25 mg via INTRAVENOUS
  Filled 2018-10-07: qty 1

## 2018-10-07 MED ORDER — SCOPOLAMINE 1 MG/3DAYS TD PT72: 1.0000 | MEDICATED_PATCH | TRANSDERMAL | 1 refills | Status: DC

## 2018-10-07 MED ORDER — PROMETHAZINE HCL 25 MG RE SUPP: 25.0000 mg | Freq: Four times a day (QID) | RECTAL | 1 refills | Status: DC | PRN

## 2018-10-07 MED ORDER — FAMOTIDINE IN NACL 20-0.9 MG/50ML-% IV SOLN
20.0000 mg | Freq: Once | INTRAVENOUS | Status: DC
  Filled 2018-10-07: qty 50

## 2018-10-07 NOTE — MAU Note (Signed)
Pt arrived via EMS with n/v and abdominal pain for past few weeks.  Pt is [redacted] wks pregnant.

## 2018-10-07 NOTE — MAU Provider Note (Signed)
History     CSN: [id number]  Arrival date and time: 10/07/18 1136   First Provider Initiated Contact with Patient 10/07/18 1232      Chief Complaint  Patient presents with  . Abdominal Pain  . Nausea   Veronica Green is a 38 y.o. [id number] at [redacted]w[redacted]d who presents for Abdominal Pain and Nausea.  Patient states she has been "throwing up since I left here the last time" and confirms that that was April 21st.  She states she is unable to sleep and is now having burning in her chest from all the vomiting.  Patient states she is "taking whatever they gave me last time and it doesn't help."  Patient states she tried to take the medication "the other night," but was unable to keep it down.       OB History    Gravida  7   Para  5   Term  5   Preterm  0   AB  1   Living  5     SAB  0   TAB  1   Ectopic  0   Multiple  0   Live Births  5           Past Medical History:  Diagnosis Date  . Anxiety   . Ascites   . Sickle cell trait ([place])   . Umbilical hernia     Past Surgical History:  Procedure Laterality Date  . DILATION AND CURETTAGE OF UTERUS    . HERNIA REPAIR    . RIGHT OOPHORECTOMY  07/2016  . TUMOR REMOVAL  07/2016    Family History  Problem Relation Age of Onset  . Cancer Maternal Grandmother   . Cancer Paternal Grandmother   . Asthma Mother   . Multiple sclerosis Sister   . Anesthesia problems Neg Hx   . Hypotension Neg Hx   . Malignant hyperthermia Neg Hx   . Pseudochol deficiency Neg Hx     Social History   Tobacco Use  . Smoking status: Former Smoker    Packs/day: 0.50    Types: Cigarettes    Last attempt to quit: 11/21/2015    Years since quitting: 2.8  . Smokeless tobacco: Never Used  . Tobacco comment: unable to smoke last 2 wks  Substance Use Topics  . Alcohol use: No    Comment: occ  . Drug use: No    Allergies: No Known Allergies  Medications Prior to Admission  Medication Sig Dispense Refill Last Dose  . doxylamine, Sleep,  (UNISOM) 25 MG tablet Take 1 tablet (25 mg total) by mouth 4 (four) times daily as needed (nausea and vomiting). 30 tablet 2   . Prenatal Vit-Fe Fumarate-FA (PREPLUS) 27-1 MG TABS Take 1 tablet by mouth daily. 30 tablet 13   . pyridOXINE (VITAMIN B-6) 25 MG tablet Take 1 tablet (25 mg total) by mouth every 8 (eight) hours. 30 tablet 0     Review of Systems  Constitutional: Negative for chills and fever.   Physical Exam   Blood pressure 118/84, pulse 84, temperature 98.3 F (36.8 C), temperature source Oral, last menstrual period 08/29/2018, SpO2 100 %, not currently breastfeeding.  Physical Exam  Constitutional: She is oriented to person, place, and time. She appears well-developed and well-nourished. She appears distressed.  HENT:  Head: Normocephalic and atraumatic.  Eyes: Conjunctivae are normal.  Neck: Normal range of motion.  Cardiovascular: Normal rate.  Respiratory: Effort normal.  GI: Soft. There is no  abdominal tenderness.  Musculoskeletal: Normal range of motion.  Neurological: She is oriented to person, place, and time.  Skin: Skin is warm and dry.  Psychiatric: She has a normal mood and affect. Her behavior is normal.    MAU Course  Procedures No results found for this or any previous visit (from the past 24 hour(s)).  MDM Start IV IV Bolus f/b  Antiemetics PPI Assessment and Plan  38 year old [id number] at 5.4 weeks N/V  -Start IV with LR Bolus F/B MVI -Give 40mg  Protonix IV -Give Phenergan 25mg  IV -Will monitor and reassess  Follow Up (1:59 PM)  -Patient reports no improvement in symptoms. -Has not yet received MV infusion. -Will give IV Reglan and scopolamine patch now. -Patient able to get up and walk to scale for weight. *45.3kg today and 48.263 on September 27, 2018; ~6lb weight loss.  Follow Up (2:54 PM)  -In room to reassess, patient asleep. -MVI continues to infuse. -Provider will return to reassess.   Follow Up (4:03 PM)  -Infusion  complete and patient agreeable to ice chips.  -Patient given ice chips and now reports that she feels as bad as when she first arrived. -Discussed how N/V will occur throughout the first trimester and current goal is to avoid continued weight loss.   -Patient does admit to marijuana usage prior to pregnancy. -Discussed cannabinoid hyperemesis and expectations for improvement in symptoms including medication compliance, small frequent meals, and proper hydration. -Discussed usage of meal replacements as tolerated.  -Encouraged utilization of family members to assist with other children. -Encouraged to schedule for initiation of [facility]; patient plans to go to [facility]. -Patient offered and declines Zofran ODT dosing. -Rx for Phenergan 25mg  Suppository -Rx for Scopolamine patch -Discharged to home   [name] MSN, CNM 10/07/2018, 12:32 PM

## 2018-10-07 NOTE — Discharge Instructions (Signed)
Cannabinoid Hyperemesis Syndrome °Cannabinoid hyperemesis syndrome (CHS) is a condition that causes repeated nausea, vomiting, and abdominal pain after long-term (chronic) use of marijuana (cannabis). People with CHS typically use marijuana 3-5 times a day for many years before they have symptoms, although it is possible to develop CHS with as little as 1 use per day. °Symptoms of CHS may be mild at first but can get worse and more frequent. In some cases, CHS may cause vomiting many times a day, which can lead to weight loss and dehydration. CHS may go away and come back many times (recur). People may not have symptoms or may otherwise be healthy in between CHS attacks. °What are the causes? °The exact cause of this condition is not known. Long-term use of marijuana may over-stimulate certain proteins in the brain that react with chemicals in marijuana (cannabinoid receptors). This over-stimulation may cause CHS. °What are the signs or symptoms? °Symptoms of this condition are often mild during the first few attacks, but they can get worse over time. Symptoms may include: °· Frequent nausea, especially early in the morning. °· Vomiting. °· Abdominal pain. °Taking several hot showers throughout the day can also be a sign of this condition. People with CHS may do this because it relieves symptoms. °How is this diagnosed? °This condition may be diagnosed based on: °· Your symptoms and medical history, including any drug use. °· A physical exam. °You may have tests done to rule out other problems. These tests may include: °· Blood tests. °· Urine tests. °· Imaging tests, such as an X-ray or CT scan. °How is this treated? °Treatment for this condition involves stopping marijuana use. Your health care provider may recommend: °· A drug rehabilitation program, if you have trouble stopping marijuana use. °· Medicines for nausea. °· Hot showers to help relieve symptoms. °Certain creams that contain a substance called  capsaicin may improve symptoms when applied to the abdomen. Ask your health care provider before starting any medicines or other treatments. °Severe nausea and vomiting may require you to stay at the hospital. You may need IV fluids to prevent or treat dehydration. You may also need certain medicines that must be given at the hospital. °Follow these instructions at home: °During an attack ° °· Stay in bed and rest in a dark, quiet room. °· Take anti-nausea medicine as told by your health care provider. °· Try taking hot showers to relieve your symptoms. °After an attack °· Drink small amounts of clear fluids slowly. Gradually add more. °· Once you are able to eat without vomiting, eat soft foods in small amounts every 3-4 hours. °General instructions ° °· Do not use any products that contain marijuana.If you need help quitting, ask your health care provider for resources and treatment options. °· Drink enough fluid to keep your urine pale yellow. Avoid drinking fluids that have a lot of sugar or caffeine, such as coffee and soda. °· Take and apply over-the-counter and prescription medicines only as told by your health care provider. Ask your health care provider before starting any new medicines or treatments. °· Keep all follow-up visits as told by your health care provider. This is important. °Contact a health care provider if: °· Your symptoms get worse. °· You cannot drink fluids without vomiting. °· You have pain and trouble swallowing after an attack. °Get help right away if: °· You cannot stop vomiting. °· You have blood in your vomit or your vomit looks like coffee grounds. °· You have   severe abdominal pain.  You have stools that are bloody or black, or stools that look like tar.  You have symptoms of dehydration, such as: ? Sunken eyes. ? Inability to make tears. ? Cracked lips. ? Dry mouth. ? Decreased urine production. ? Weakness. ? Sleepiness. ? Fainting. Summary  Cannabinoid hyperemesis  syndrome (CHS) is a condition that causes repeated nausea, vomiting, and abdominal pain after long-term use of marijuana.  People with CHS typically use marijuana 3-5 times a day for many years before they have symptoms, although it is possible to develop CHS with as little as 1 use per day.  Treatment for this condition involves stopping marijuana use. Hot showers and capsaicin creams may also help relieve symptoms. Ask your health care provider before starting any medicines or other treatments.  Your health care provider may prescribe medicines to help with nausea.  Get help right away if you have signs of dehydration, such as dry mouth, decreased urine production, or weakness. This information is not intended to replace advice given to you by your health care provider. Make sure you discuss any questions you have with your health care provider. Document Released: 09/02/2016 Document Revised: 09/02/2016 Document Reviewed: 09/02/2016 Elsevier Interactive Patient Education  2019 [employer].

## 2018-10-09 ENCOUNTER — Inpatient Hospital Stay (HOSPITAL_COMMUNITY): Payer: Self-pay

## 2018-10-09 ENCOUNTER — Other Ambulatory Visit: Payer: Self-pay

## 2018-10-09 ENCOUNTER — Encounter (HOSPITAL_COMMUNITY): Payer: Self-pay

## 2018-10-09 ENCOUNTER — Inpatient Hospital Stay (HOSPITAL_COMMUNITY)
Admission: AD | Admit: 2018-10-09 | Discharge: 2018-10-10 | Disposition: A | Discharge status: Home / Self Care | Payer: Self-pay | Attending: Family Medicine | Admitting: Family Medicine

## 2018-10-09 DIAGNOSIS — R1084 Generalized abdominal pain: Secondary | ICD-10-CM | POA: Insufficient documentation

## 2018-10-09 DIAGNOSIS — O26891 Other specified pregnancy related conditions, first trimester: Secondary | ICD-10-CM | POA: Insufficient documentation

## 2018-10-09 DIAGNOSIS — Z79899 Other long term (current) drug therapy: Secondary | ICD-10-CM | POA: Insufficient documentation

## 2018-10-09 DIAGNOSIS — Z3491 Encounter for supervision of normal pregnancy, unspecified, first trimester: Secondary | ICD-10-CM

## 2018-10-09 DIAGNOSIS — O210 Mild hyperemesis gravidarum: Secondary | ICD-10-CM | POA: Insufficient documentation

## 2018-10-09 DIAGNOSIS — Z3A01 Less than 8 weeks gestation of pregnancy: Secondary | ICD-10-CM

## 2018-10-09 DIAGNOSIS — Z87891 Personal history of nicotine dependence: Secondary | ICD-10-CM | POA: Insufficient documentation

## 2018-10-09 DIAGNOSIS — Z3A09 9 weeks gestation of pregnancy: Secondary | ICD-10-CM | POA: Insufficient documentation

## 2018-10-09 DIAGNOSIS — R109 Unspecified abdominal pain: Secondary | ICD-10-CM

## 2018-10-09 DIAGNOSIS — D573 Sickle-cell trait: Secondary | ICD-10-CM | POA: Insufficient documentation

## 2018-10-09 HISTORY — DX: Acquired absence of ovaries, unilateral: Z90.721

## 2018-10-09 LAB — URINALYSIS, ROUTINE W REFLEX MICROSCOPIC
Bilirubin Urine: NEGATIVE
Glucose, UA: NEGATIVE mg/dL
Hgb urine dipstick: NEGATIVE
Ketones, ur: 20 mg/dL — AB
Nitrite: NEGATIVE
Protein, ur: 100 mg/dL — AB
Specific Gravity, Urine: 1.028 (ref 1.005–1.030)
pH: 7 (ref 5.0–8.0)

## 2018-10-09 LAB — CBC
HCT: UNDETERMINED % (ref 36.0–46.0)
Hemoglobin: 15.2 g/dL — ABNORMAL HIGH (ref 12.0–15.0)
MCH: UNDETERMINED pg (ref 26.0–34.0)
MCHC: UNDETERMINED g/dL (ref 30.0–36.0)
MCV: UNDETERMINED fL (ref 80.0–100.0)
Platelets: 351 10*3/uL (ref 150–400)
RBC: UNDETERMINED MIL/uL (ref 3.87–5.11)
RDW: UNDETERMINED % (ref 11.5–15.5)
WBC: 14.8 10*3/uL — ABNORMAL HIGH (ref 4.0–10.5)
nRBC: 0 % (ref 0.0–0.2)

## 2018-10-09 LAB — BASIC METABOLIC PANEL
Anion gap: 14 (ref 5–15)
BUN: 8 mg/dL (ref 6–20)
CO2: 22 mmol/L (ref 22–32)
Calcium: 9.3 mg/dL (ref 8.9–10.3)
Chloride: 95 mmol/L — ABNORMAL LOW (ref 98–111)
Creatinine, Ser: 0.61 mg/dL (ref 0.44–1.00)
GFR calc Af Amer: >60 mL/min (ref >60)
GFR calc non Af Amer: >60 mL/min (ref >60)
Glucose, Bld: 111 mg/dL — ABNORMAL HIGH (ref 70–99)
Potassium: 2.9 mmol/L — ABNORMAL LOW (ref 3.5–5.1)
Sodium: 131 mmol/L — ABNORMAL LOW (ref 135–145)

## 2018-10-09 LAB — HCG, QUANTITATIVE, PREGNANCY: hCG, Beta Chain, Quant, S: 122723 m[IU]/mL — ABNORMAL HIGH (ref <5)

## 2018-10-09 MED ORDER — M.V.I. ADULT IV INJ
Freq: Once | INTRAVENOUS | Status: AC
  Administered 2018-10-09: 22:00:00 via INTRAVENOUS
  Filled 2018-10-09: qty 10

## 2018-10-09 MED ORDER — LACTATED RINGERS IV BOLUS
1000.0000 mL | Freq: Once | INTRAVENOUS | Status: AC
  Administered 2018-10-09: 21:00:00 1000 mL via INTRAVENOUS

## 2018-10-09 MED ORDER — PROMETHAZINE HCL 25 MG/ML IJ SOLN
25.0000 mg | Freq: Once | INTRAMUSCULAR | Status: AC
  Administered 2018-10-09: 21:00:00 25 mg via INTRAVENOUS
  Filled 2018-10-09: qty 1

## 2018-10-09 MED ORDER — POTASSIUM CHLORIDE 10 MEQ/100ML IV SOLN
10.0000 meq | INTRAVENOUS | Status: AC
  Administered 2018-10-09 (×2): 10 meq via INTRAVENOUS
  Filled 2018-10-09 (×2): qty 100

## 2018-10-09 MED ORDER — FAMOTIDINE IN NACL 20-0.9 MG/50ML-% IV SOLN
20.0000 mg | Freq: Once | INTRAVENOUS | Status: AC
Start: 2018-10-09 — End: 2018-10-09
  Administered 2018-10-09: 21:00:00 20 mg via INTRAVENOUS
  Filled 2018-10-09: qty 50

## 2018-10-09 MED ORDER — LACTATED RINGERS IV BOLUS
1000.0000 mL | Freq: Once | INTRAVENOUS | Status: AC
  Administered 2018-10-10: 1000 mL via INTRAVENOUS

## 2018-10-09 NOTE — MAU Provider Note (Signed)
Chief Complaint: Vomiting and Abdominal Pain   First Provider Initiated Contact with Patient 10/09/18 2003     SUBJECTIVE HPI: Veronica Green is a 38 y.o. [id number] at [redacted]w[redacted]d who presents to Maternity Admissions reporting n/v. Was in MAU on 5/1 with same complaint. Was sent home with scopolamine patch applied & rx for phenergan suppositories in addition to the unisom/vit b6 she already had at home. Reports she can't keep down the B6 & was unable to get the phenergan rx due to cost. She took the scop patch off yesterday b/c she felt like she was "taking too much medication".  States she has vomited "too many times" today. Tried to eat bread & drink juice at noon but couldn't keep it down. Last smoked marijuana 2 days ago.  Also reports generalized abdominal pain. Denies vaginal bleeding.   Location: abdomen Quality: cramping Severity: 10/10 on pain scale Duration: 3 days Timing: constant Modifying factors: worse with vomiting Associated signs and symptoms: n/v  Past Medical History:  Diagnosis Date  . Anxiety   . Ascites   . H/O unilateral oophorectomy 1.5 yrs ago   right   . Sickle cell trait ([place])   . Umbilical hernia    OB History  Gravida Para Term Preterm AB Living  7 5 5  0 1 5  SAB TAB Ectopic Multiple Live Births  0 1 0 0 5    # Outcome Date GA Lbr Len/2nd Weight Sex Delivery Anes PTL Lv  7 Current           6 Term 01/09/17 [redacted]w[redacted]d 05:49 / 00:12 2940 g M Vag-Spont None  LIV  5 Term 09/14/14 [redacted]w[redacted]d 12:30 / 00:14 3215 g F Vag-Spont None  LIV  4 Term 05/06/11 [redacted]w[redacted]d 07:27 / 00:11 2665 g  Vag-Spont None  LIV  3 Term 07/13/09     Vag-Spont  N LIV  2 TAB 2010 [redacted]w[redacted]d         1 Term 11/11/99     Vag-Spont  N LIV   Past Surgical History:  Procedure Laterality Date  . DILATION AND CURETTAGE OF UTERUS    . HERNIA REPAIR    . RIGHT OOPHORECTOMY  07/2016  . TUMOR REMOVAL  07/2016   Social History   Socioeconomic History  . Marital status: Single    Spouse name: Not on file  .  Number of children: Not on file  . Years of education: Not on file  . Highest education level: Not on file  Occupational History  . Not on file  Social Needs  . Financial resource strain: Not on file  . Food insecurity:    Worry: Not on file    Inability: Not on file  . Transportation needs:    Medical: Not on file    Non-medical: Not on file  Tobacco Use  . Smoking status: Former Smoker    Packs/day: 0.50    Types: Cigarettes    Last attempt to quit: 11/21/2015    Years since quitting: 2.8  . Smokeless tobacco: Never Used  . Tobacco comment: unable to smoke last 2 wks  Substance and Sexual Activity  . Alcohol use: No    Comment: occ  . Drug use: Yes    Types: Marijuana    Comment: last used x 2 days   . Sexual activity: Not Currently    Partners: Male    Birth control/protection: Abstinence, None  Lifestyle  . Physical activity:    Days per week: Not on  file    Minutes per session: Not on file  . Stress: Not on file  Relationships  . Social connections:    Talks on phone: Not on file    Gets together: Not on file    Attends religious service: Not on file    Active member of club or organization: Not on file    Attends meetings of clubs or organizations: Not on file    Relationship status: Not on file  . Intimate partner violence:    Fear of current or ex partner: Not on file    Emotionally abused: Not on file    Physically abused: Not on file    Forced sexual activity: Not on file  Other Topics Concern  . Not on file  Social History Narrative  . Not on file   Family History  Problem Relation Age of Onset  . Cancer Maternal Grandmother   . Cancer Paternal Grandmother   . Asthma Mother   . Multiple sclerosis Sister   . Anesthesia problems Neg Hx   . Hypotension Neg Hx   . Malignant hyperthermia Neg Hx   . Pseudochol deficiency Neg Hx    No current facility-administered medications on file prior to encounter.    Current Outpatient Medications on File  Prior to Encounter  Medication Sig Dispense Refill  . doxylamine, Sleep, (UNISOM) 25 MG tablet Take 1 tablet (25 mg total) by mouth 4 (four) times daily as needed (nausea and vomiting). 30 tablet 2  . Prenatal Vit-Fe Fumarate-FA (PREPLUS) 27-1 MG TABS Take 1 tablet by mouth daily. 30 tablet 13  . promethazine (PHENERGAN) 25 MG suppository Place 1 suppository (25 mg total) rectally every 6 (six) hours as needed for nausea or vomiting. 12 each 1  . pyridOXINE (VITAMIN B-6) 25 MG tablet Take 1 tablet (25 mg total) by mouth every 8 (eight) hours. 30 tablet 0  . scopolamine (TRANSDERM-SCOP) 1 MG/3DAYS Place 1 patch (1.5 mg total) onto the skin every 3 (three) days. 3 patch 1   No Known Allergies  I have reviewed patient's Past Medical Hx, Surgical Hx, Family Hx, Social Hx, medications and allergies.   Review of Systems  Constitutional: Negative.   Gastrointestinal: Positive for abdominal pain, nausea and vomiting. Negative for blood in stool, constipation and diarrhea.  Genitourinary: Negative.   Neurological: Positive for headaches.    OBJECTIVE Patient Vitals for the past 24 hrs:  BP Temp Temp src Pulse Resp SpO2 Weight  10/10/18 0005 (!) 98/59 - - 68 16 100 % -  10/09/18 2344 - 98.5 F (36.9 C) Oral - - - -  10/09/18 2040 95/63 - - 70 - - -  10/09/18 2025 - - - - - - 43.4 kg  10/09/18 1920 92/72 99.6 F (37.6 C) Oral 89 16 100 % -   Constitutional: Well-developed, well-nourished female in no acute distress.  Cardiovascular: normal rate & rhythm, no murmur Respiratory: normal rate and effort. Lung sounds clear throughout GI: TTP throughout lower abdomen. Abd soft with BS throughout. No guarding or rebound.  MS: Extremities nontender, no edema, normal ROM Neurologic: Alert and oriented x 4.     LAB RESULTS Results for orders placed or performed during the hospital encounter of 10/09/18 (from the past 24 hour(s))  Basic metabolic panel     Status: Abnormal   Collection Time:  10/09/18  8:13 PM  Result Value Ref Range   Sodium 131 (L) 135 - 145 mmol/L   Potassium 2.9 (L) 3.5 -  5.1 mmol/L   Chloride 95 (L) 98 - 111 mmol/L   CO2 22 22 - 32 mmol/L   Glucose, Bld 111 (H) 70 - 99 mg/dL   BUN 8 6 - 20 mg/dL   Creatinine, Ser 0.61 0.44 - 1.00 mg/dL   Calcium 9.3 8.9 - 10.3 mg/dL   GFR calc non Af Amer >60 >60 mL/min   GFR calc Af Amer >60 >60 mL/min   Anion gap 14 5 - 15  CBC     Status: Abnormal   Collection Time: 10/09/18  8:13 PM  Result Value Ref Range   WBC 14.8 (H) 4.0 - 10.5 K/uL   RBC Unable to determine due to a cold agglutinin 3.87 - 5.11 MIL/uL   Hemoglobin 15.2 (H) 12.0 - 15.0 g/dL   HCT Unable to determine due to a cold agglutinin 36.0 - 46.0 %   MCV Unable to determine due to a cold agglutinin 80.0 - 100.0 fL   MCH Unable to determine due to a cold agglutinin 26.0 - 34.0 pg   MCHC Unable to determine due to a cold agglutinin 30.0 - 36.0 g/dL   RDW Unable to determine due to a cold agglutinin 11.5 - 15.5 %   Platelets 351 150 - 400 K/uL   nRBC 0.0 0.0 - 0.2 %  hCG, quantitative, pregnancy     Status: Abnormal   Collection Time: 10/09/18  8:13 PM  Result Value Ref Range   hCG, Beta Chain, Quant, S 122,723 (H) <5 mIU/mL  Urinalysis, Routine w reflex microscopic     Status: Abnormal   Collection Time: 10/09/18  8:22 PM  Result Value Ref Range   Color, Urine AMBER (A) YELLOW   APPearance CLOUDY (A) CLEAR   Specific Gravity, Urine 1.028 1.005 - 1.030   pH 7.0 5.0 - 8.0   Glucose, UA NEGATIVE NEGATIVE mg/dL   Hgb urine dipstick NEGATIVE NEGATIVE   Bilirubin Urine NEGATIVE NEGATIVE   Ketones, ur 20 (A) NEGATIVE mg/dL   Protein, ur 100 (A) NEGATIVE mg/dL   Nitrite NEGATIVE NEGATIVE   Leukocytes,Ua SMALL (A) NEGATIVE   RBC / HPF 11-20 0 - 5 RBC/hpf   WBC, UA 11-20 0 - 5 WBC/hpf   Bacteria, UA FEW (A) NONE SEEN   Squamous Epithelial / LPF 21-50 0 - 5   Mucus PRESENT     IMAGING US Ob Transvaginal  Result Date: 10/09/2018 CLINICAL DATA:  38  y/o F; abdominal pain and vomiting. Beta hCG pending. EXAM: TRANSVAGINAL OB ULTRASOUND TECHNIQUE: Transvaginal ultrasound was performed for complete evaluation of the gestation as well as the maternal uterus, adnexal regions, and pelvic cul-de-sac. COMPARISON:  07/05/2016 pelvic ultrasound. FINDINGS: Intrauterine gestational sac: Single Yolk sac:  Visualized. Embryo:  Visualized. Cardiac Activity: Visualized. Heart Rate: 169 bpm CRL: 23.4 mm   9 w 0 d                  [place] EDC: 05/14/2019 Subchorionic hemorrhage:  None visualized. Maternal uterus/adnexae: Right ovary not visualized. Left ovary corpus luteum, left ovary measures 3.2 x 1.5 x 1.9 cm. Retroverted uterus. No pelvic free fluid. IMPRESSION: Single live intrauterine pregnancy with estimated gestational age of [redacted] weeks and 0 days. Electronically Signed   By: Kristine Garbe M.D.   On: 10/09/2018 21:18    MAU COURSE Orders Placed This Encounter  Procedures  . US OB Transvaginal  . Urinalysis, Routine w reflex microscopic  . Basic metabolic panel  . CBC  . hCG,  quantitative, pregnancy  . Daily weights  . Discharge patient   Meds ordered this encounter  Medications  . FOLLOWED BY Linked Order Group   . lactated ringers bolus 1,000 mL   . multivitamins adult (INFUVITE ADULT) 10 mL in dextrose 5% lactated ringers 1,000 mL infusion  . famotidine (PEPCID) IVPB 20 mg premix  . promethazine (PHENERGAN) injection 25 mg  . potassium chloride 10 mEq in 100 mL IVPB  . lactated ringers bolus 1,000 mL  . promethazine (PHENERGAN) injection 12.5 mg  . promethazine (PHENERGAN) 25 MG tablet    Sig: Take 1 tablet (25 mg total) by mouth every 6 (six) hours as needed for nausea or vomiting.    Dispense:  30 tablet    Refill:  0    Order Specific Question:   Supervising Provider    Answer:   Donnamae Jude [[id number]]    MDM +UPT UA, CBC, ABO/Rh, quant hCG, and [place] today to rule out ectopic pregnancy Ultrasound shows live IUP measuring [redacted]w[redacted]d, EDD  updated.   IV fluids given, 1 liter of LR followed by MVI in D5LR. Phenergan 25 mg & pepcid 20 mg given IV Potassium down to 2.9, potassium run x 2 given while in MAU  Weight down 4.85 kg since 4/21. Pt not vomiting while in MAU. Will discharge home with rx for phenergan tablets as patient can't afford the suppositories.   ASSESSMENT 1. Normal IUP (intrauterine pregnancy) on prenatal ultrasound, first trimester   2. Abdominal pain during pregnancy in first trimester   3. Hyperemesis arising during pregnancy     PLAN Discharge home in stable condition. Discussed reasons to return to MAU Rx phenergan   Allergies as of 10/10/2018   No Known Allergies     Medication List    STOP taking these medications   promethazine 25 MG suppository Commonly known as:  PHENERGAN Replaced by:  promethazine 25 MG tablet     TAKE these medications   doxylamine (Sleep) 25 MG tablet Commonly known as:  UNISOM Take 1 tablet (25 mg total) by mouth 4 (four) times daily as needed (nausea and vomiting).   PrePLUS 27-1 MG Tabs Take 1 tablet by mouth daily.   promethazine 25 MG tablet Commonly known as:  PHENERGAN Take 1 tablet (25 mg total) by mouth every 6 (six) hours as needed for nausea or vomiting. Replaces:  promethazine 25 MG suppository   pyridOXINE 25 MG tablet Commonly known as:  VITAMIN B-6 Take 1 tablet (25 mg total) by mouth every 8 (eight) hours.   scopolamine 1 MG/3DAYS Commonly known as:  TRANSDERM-SCOP Place 1 patch (1.5 mg total) onto the skin every 3 (three) days.        Jorje Guild, NP 10/10/2018  1:23 AM

## 2018-10-09 NOTE — MAU Note (Addendum)
By EMS, presents with constant vomiting since beginning of pregnancy without improvement. Pt with abd pain 9/10. No vag bldg or LOF. C/o nausea, no diarrhea.    Gilmer Mor RN

## 2018-10-10 MED ORDER — PROMETHAZINE HCL 25 MG/ML IJ SOLN
12.5000 mg | Freq: Once | INTRAMUSCULAR | Status: AC
  Administered 2018-10-10: 01:00:00 12.5 mg via INTRAVENOUS
  Filled 2018-10-10: qty 1

## 2018-10-10 MED ORDER — PROMETHAZINE HCL 25 MG PO TABS: 25.0000 mg | ORAL_TABLET | Freq: Four times a day (QID) | ORAL | 0 refills | Status: DC | PRN

## 2018-10-10 NOTE — Discharge Instructions (Signed)
Hyperemesis Gravidarum Hyperemesis gravidarum is a severe form of nausea and vomiting that happens during pregnancy. Hyperemesis is worse than morning sickness. It may cause you to have nausea or vomiting all day for many days. It may keep you from eating and drinking enough food and liquids, which can lead to dehydration, malnutrition, and weight loss. Hyperemesis usually occurs during the first half (the first 20 weeks) of pregnancy. It often goes away once a woman is in her second half of pregnancy. However, sometimes hyperemesis continues through an entire pregnancy. What are the causes? The cause of this condition is not known. It may be related to changes in chemicals (hormones) in the body during pregnancy, such as the high level of pregnancy hormone (human chorionic gonadotropin) or the increase in the female sex hormone (estrogen). What are the signs or symptoms? Symptoms of this condition include:  Nausea that does not go away.  Vomiting that does not allow you to keep any food down.  Weight loss.  Body fluid loss (dehydration).  Having no desire to eat, or not liking food that you have previously enjoyed. How is this diagnosed? This condition may be diagnosed based on:  A physical exam.  Your medical history.  Your symptoms.  Blood tests.  Urine tests. How is this treated? This condition is managed by controlling symptoms. This may include:  Following an eating plan. This can help lessen nausea and vomiting.  Taking prescription medicines. An eating plan and medicines are often used together to help control symptoms. If medicines do not help relieve nausea and vomiting, you may need to receive fluids through an IV at the hospital. Follow these instructions at home: Eating and drinking   Avoid the following: ? Drinking fluids with meals. Try not to drink anything during the 30 minutes before and after your meals. ? Drinking more than 1 cup of fluid at a  time. ? Eating foods that trigger your symptoms. These may include spicy foods, coffee, high-fat foods, very sweet foods, and acidic foods. ? Skipping meals. Nausea can be more intense on an empty stomach. If you cannot tolerate food, do not force it. Try sucking on ice chips or other frozen items and make up for missed calories later. ? Lying down within 2 hours after eating. ? Being exposed to environmental triggers. These may include food smells, smoky rooms, closed spaces, rooms with strong smells, warm or humid places, overly loud and noisy rooms, and rooms with motion or flickering lights. Try eating meals in a well-ventilated area that is free of strong smells. ? Quick and sudden changes in your movement. ? Taking iron pills and multivitamins that contain iron. If you take prescription iron pills, do not stop taking them unless your health care provider approves. ? Preparing food. The smell of food can spoil your appetite or trigger nausea.  To help relieve your symptoms: ? Listen to your body. Everyone is different and has different preferences. Find what works best for you. ? Eat and drink slowly. ? Eat 5-6 small meals daily instead of 3 large meals. Eating small meals and snacks can help you avoid an empty stomach. ? In the morning, before getting out of bed, eat a couple of crackers to avoid moving around on an empty stomach. ? Try eating starchy foods as these are usually tolerated well. Examples include cereal, toast, bread, potatoes, pasta, rice, and pretzels. ? Include at least 1 serving of protein with your meals and snacks. Protein options include  least 1 serving of protein with your meals and snacks. Protein options include lean meats, poultry, seafood, beans, nuts, nut butters, eggs, cheese, and yogurt.  Try eating a protein-rich snack before bed. Examples of a protein-rick snack include cheese and crackers or a peanut butter sandwich made with 1 slice of whole-wheat bread and 1 tsp (5 g) of peanut butter.  Eat or suck on things that have ginger in them. It may help relieve nausea. Add  tsp ground ginger to hot tea or  choose ginger tea.  Try drinking 100% fruit juice or an electrolyte drink. An electrolyte drink contains sodium, potassium, and chloride.  Drink fluids that are cold, clear, and carbonated or sour. Examples include lemonade, ginger ale, lemon-lime soda, ice water, and sparkling water.  Brush your teeth or use a mouth rinse after meals.  Talk with your health care provider about starting a supplement of vitamin B6.  General instructions  Take over-the-counter and prescription medicines only as told by your health care provider.  Follow instructions from your health care provider about eating or drinking restrictions.  Continue to take your prenatal vitamins as told by your health care provider. If you are having trouble taking your prenatal vitamins, talk with your health care provider about different options.  Keep all follow-up and pre-birth (prenatal) visits as told by your health care provider. This is important.  Contact a health care provider if:  You have pain in your abdomen.  You have a severe headache.  You have vision problems.  You are losing weight.  You feel weak or dizzy.  Get help right away if:  You cannot drink fluids without vomiting.  You vomit blood.  You have constant nausea and vomiting.  You are very weak.  You faint.  You have a fever and your symptoms suddenly get worse.  Summary  Hyperemesis gravidarum is a severe form of nausea and vomiting that happens during pregnancy.  Making some changes to your eating habits may help relieve nausea and vomiting.  This condition may be managed with medicine.  If medicines do not help relieve nausea and vomiting, you may need to receive fluids through an IV at the hospital.  This information is not intended to replace advice given to you by your health care provider. Make sure you discuss any questions you have with your health care provider.  Document Released: 05/25/2005 Document Revised: 06/14/2017 Document Reviewed: 01/22/2016  [employer]vier Interactive  Patient Education  2019 [employer]

## 2018-10-11 LAB — CULTURE, OB URINE: Culture: 20000 — AB

## 2019-04-12 ENCOUNTER — Other Ambulatory Visit: Payer: Self-pay

## 2019-04-12 ENCOUNTER — Encounter (HOSPITAL_COMMUNITY): Payer: Self-pay

## 2019-04-12 ENCOUNTER — Ambulatory Visit (HOSPITAL_COMMUNITY)
Admission: EM | Admit: 2019-04-12 | Discharge: 2019-04-12 | Disposition: A | Discharge status: Home / Self Care | Payer: Medicaid Other | Attending: Family Medicine | Admitting: Family Medicine

## 2019-04-12 DIAGNOSIS — N898 Other specified noninflammatory disorders of vagina: Secondary | ICD-10-CM | POA: Insufficient documentation

## 2019-04-12 DIAGNOSIS — Z3201 Encounter for pregnancy test, result positive: Secondary | ICD-10-CM | POA: Insufficient documentation

## 2019-04-12 LAB — POCT URINALYSIS DIP (DEVICE)
Glucose, UA: NEGATIVE mg/dL
Ketones, ur: 40 mg/dL — AB
Leukocytes,Ua: NEGATIVE
Nitrite: NEGATIVE
Protein, ur: 30 mg/dL — AB
Specific Gravity, Urine: 1.025 (ref 1.005–1.030)
Urobilinogen, UA: 1 mg/dL (ref 0.0–1.0)
pH: 6 (ref 5.0–8.0)

## 2019-04-12 LAB — POCT PREGNANCY, URINE: Preg Test, Ur: POSITIVE — AB

## 2019-04-12 MED ORDER — LIDOCAINE HCL (PF) 1 % IJ SOLN
INTRAMUSCULAR | Status: AC
  Filled 2019-04-12: qty 2

## 2019-04-12 MED ORDER — AZITHROMYCIN 250 MG PO TABS
ORAL_TABLET | ORAL | Status: AC
  Filled 2019-04-12: qty 4

## 2019-04-12 MED ORDER — CEFTRIAXONE SODIUM 250 MG IJ SOLR
250.0000 mg | Freq: Once | INTRAMUSCULAR | Status: AC
  Administered 2019-04-12: 250 mg via INTRAMUSCULAR

## 2019-04-12 MED ORDER — PREPLUS 27-1 MG PO TABS: 1.0000 | ORAL_TABLET | Freq: Every day | ORAL | 0 refills | Status: DC

## 2019-04-12 MED ORDER — AZITHROMYCIN 250 MG PO TABS
1000.0000 mg | ORAL_TABLET | Freq: Once | ORAL | Status: AC
  Administered 2019-04-12: 1000 mg via ORAL

## 2019-04-12 MED ORDER — CEFTRIAXONE SODIUM 250 MG IJ SOLR
INTRAMUSCULAR | Status: AC
  Filled 2019-04-12: qty 250

## 2019-04-12 NOTE — Discharge Instructions (Addendum)
You have been given the following medications today for treatment of suspected gonorrhea and/or chlamydia:  cefTRIAXone (ROCEPHIN) injection 250 mg azithromycin (ZITHROMAX) tablet 1,000 mg  Even though we have treated you today, we have sent testing for sexually transmitted infections. We will notify you of any positive results once they are received. If required, we will prescribe any medications you might need.  Please refrain from all sexual activity for at least the next seven days.

## 2019-04-12 NOTE — ED Triage Notes (Signed)
Pt states she has vaginal discharge. Pt states she has some cramping and back pain. X 3 days. Pt states she took a home pregnancy test and it was positive.

## 2019-04-12 NOTE — ED Provider Notes (Addendum)
[place]   [id number] 04/12/19 Arrival Time: [id number]  ASSESSMENT & PLAN:  1. Vaginal discharge   2. Positive pregnancy test     UPT: positive. No s/s of PID. Benign abdominal exam. No indications for urgent abdominal/pelvic imaging at this time. Discussed.  Follow-up Information    Schedule an appointment as soon as possible for a visit  with [facility].   Specialty: Obstetrics and Gynecology Contact information: [address] [place], [place] [id number] [address] [place] [phone]         Meds ordered this encounter  Medications  . Prenatal Vit-Fe Fumarate-FA (PREPLUS) 27-1 MG TABS    Sig: Take 1 tablet by mouth daily.    Dispense:  30 tablet    Refill:  0  . cefTRIAXone (ROCEPHIN) injection 250 mg  . azithromycin (ZITHROMAX) tablet 1,000 mg     Discharge Instructions     You have been given the following medications today for treatment of suspected gonorrhea and/or chlamydia:  cefTRIAXone (ROCEPHIN) injection 250 mg azithromycin (ZITHROMAX) tablet 1,000 mg  Even though we have treated you today, we have sent testing for sexually transmitted infections. We will notify you of any positive results once they are received. If required, we will prescribe any medications you might need.  Please refrain from all sexual activity for at least the next seven days.      Reviewed expectations re: course of current medical issues. Questions answered. Outlined signs and symptoms indicating need for more acute intervention. Patient verbalized understanding. After Visit Summary given.   SUBJECTIVE: History from: patient. Veronica Green is a 38 y.o. female who presents with complaint of intermittent pelvic "cramping" with associated vaginal discharge described as "yellow-like"; without vaginal bleeding. No specific abdominal pain reported. Onset gradual, first noted 2-3 d ago. Cramping does not  wake her at night. Patient's last menstrual period was 01/29/2019. Reports positive home pregnancy test today. Symptoms are unchanged since beginning. Fever: absent. Aggravating factors: have not been identified. Alleviating factors: include Tylenol with some help. Associated symptoms: mild fatigue. She denies arthralgias, belching, constipation, diarrhea, dysuria, headache and sweats. Appetite: normal. PO intake: normal with mild nausea; no emesis. Ambulatory without assistance. Bowel movements: have not significantly changed; last bowel movement within the past 1-2 days and without blood. Reports that she is sexually active with single female partner; questions if he has other sexual partners; without regular condom use.  OB History    Gravida  7   Para  5   Term  5   Preterm  0   AB  1   Living  5     SAB  0   TAB  1   Ectopic  0   Multiple  0   Live Births  5          Past Surgical History:  Procedure Laterality Date  . DILATION AND CURETTAGE OF UTERUS    . HERNIA REPAIR    . RIGHT OOPHORECTOMY  07/2016  . TUMOR REMOVAL  07/2016    ROS: As per HPI. All other systems negative.  OBJECTIVE:  Vitals:   04/12/19 1010 04/12/19 1014  BP:  105/60  Pulse:  79  Resp:  16  Temp:  98.5 F (36.9 C)  SpO2:  100%  Weight: 49.9 kg     General appearance: alert, oriented, no acute distress HEENT: [place]; AT; oropharynx moist Lungs: clear to auscultation bilaterally; unlabored respirations Heart: regular rate and  rhythm Abdomen: soft; without distention; no specific tenderness to palpation; normal bowel sounds; without masses or organomegaly; without guarding or rebound tenderness GU: deferred Back: without CVA tenderness; FROM at waist Extremities: without LE edema; symmetrical; without gross deformities Skin: warm and dry Neurologic: normal gait Psychological: alert and cooperative; normal mood and affect  Labs: Results for orders placed or performed during the  hospital encounter of 04/12/19  POCT urinalysis dip (device)  Result Value Ref Range   Glucose, UA NEGATIVE NEGATIVE mg/dL   Bilirubin Urine SMALL (A) NEGATIVE   Ketones, ur 40 (A) NEGATIVE mg/dL   Specific Gravity, Urine 1.025 1.005 - 1.030   Hgb urine dipstick TRACE (A) NEGATIVE   pH 6.0 5.0 - 8.0   Protein, ur 30 (A) NEGATIVE mg/dL   Urobilinogen, UA 1.0 0.0 - 1.0 mg/dL   Nitrite NEGATIVE NEGATIVE   Leukocytes,Ua NEGATIVE NEGATIVE  Pregnancy, urine POC  Result Value Ref Range   Preg Test, Ur POSITIVE (A) NEGATIVE   Labs Reviewed  POC URINE PREG, ED  CERVICOVAGINAL ANCILLARY ONLY     No Known Allergies                                             Past Medical History:  Diagnosis Date  . Anxiety   . Ascites   . H/O unilateral oophorectomy 1.5 yrs ago   right   . Sickle cell trait ([place])   . Umbilical hernia    Social History   Socioeconomic History  . Marital status: Single    Spouse name: Not on file  . Number of children: Not on file  . Years of education: Not on file  . Highest education level: Not on file  Occupational History  . Not on file  Social Needs  . Financial resource strain: Not on file  . Food insecurity    Worry: Not on file    Inability: Not on file  . Transportation needs    Medical: Not on file    Non-medical: Not on file  Tobacco Use  . Smoking status: Former Smoker    Packs/day: 0.50    Types: Cigarettes    Quit date: 11/21/2015    Years since quitting: 3.3  . Smokeless tobacco: Never Used  . Tobacco comment: unable to smoke last 2 wks  Substance and Sexual Activity  . Alcohol use: No    Comment: occ  . Drug use: Yes    Types: Marijuana    Comment: last used x 2 days   . Sexual activity: Not Currently    Partners: Male    Birth control/protection: Abstinence, None  Lifestyle  . Physical activity    Days per week: Not on file    Minutes per session: Not on file  . Stress: Not on file  Relationships  . Social Product manager on phone: Not on file    Gets together: Not on file    Attends religious service: Not on file    Active member of club or organization: Not on file    Attends meetings of clubs or organizations: Not on file    Relationship status: Not on file  . Intimate partner violence    Fear of current or ex partner: Not on file    Emotionally abused: Not on file    Physically abused: Not on file  Forced sexual activity: Not on file  Other Topics Concern  . Not on file  Social History Narrative  . Not on file   Family History  Problem Relation Age of Onset  . Cancer Maternal Grandmother   . Cancer Paternal Grandmother   . Asthma Mother   . Multiple sclerosis Sister   . Anesthesia problems Neg Hx   . Hypotension Neg Hx   . Malignant hyperthermia Neg Hx   . Pseudochol deficiency Neg Hx      Vanessa Kick, MD 04/12/19 1146    Vanessa Kick, MD 04/12/19 1146

## 2019-04-13 ENCOUNTER — Inpatient Hospital Stay (HOSPITAL_COMMUNITY): Payer: Medicaid Other

## 2019-04-13 ENCOUNTER — Encounter (HOSPITAL_COMMUNITY): Payer: Self-pay | Admitting: *Deleted

## 2019-04-13 ENCOUNTER — Other Ambulatory Visit: Payer: Self-pay | Admitting: Obstetrics and Gynecology

## 2019-04-13 ENCOUNTER — Encounter (HOSPITAL_BASED_OUTPATIENT_CLINIC_OR_DEPARTMENT_OTHER): Payer: Self-pay | Admitting: *Deleted

## 2019-04-13 ENCOUNTER — Other Ambulatory Visit (HOSPITAL_COMMUNITY): Payer: Self-pay | Admitting: Obstetrics and Gynecology

## 2019-04-13 ENCOUNTER — Inpatient Hospital Stay (HOSPITAL_COMMUNITY)
Admission: AD | Admit: 2019-04-13 | Discharge: 2019-04-13 | Disposition: A | Discharge status: Home / Self Care | Payer: Medicaid Other | Attending: Obstetrics & Gynecology | Admitting: Obstetrics & Gynecology

## 2019-04-13 ENCOUNTER — Other Ambulatory Visit: Payer: Self-pay

## 2019-04-13 DIAGNOSIS — Z3A11 11 weeks gestation of pregnancy: Secondary | ICD-10-CM | POA: Diagnosis not present

## 2019-04-13 DIAGNOSIS — R109 Unspecified abdominal pain: Secondary | ICD-10-CM | POA: Insufficient documentation

## 2019-04-13 DIAGNOSIS — N939 Abnormal uterine and vaginal bleeding, unspecified: Secondary | ICD-10-CM

## 2019-04-13 DIAGNOSIS — O99891 Other specified diseases and conditions complicating pregnancy: Secondary | ICD-10-CM | POA: Insufficient documentation

## 2019-04-13 DIAGNOSIS — Z87891 Personal history of nicotine dependence: Secondary | ICD-10-CM | POA: Insufficient documentation

## 2019-04-13 DIAGNOSIS — Z90721 Acquired absence of ovaries, unilateral: Secondary | ICD-10-CM | POA: Diagnosis not present

## 2019-04-13 DIAGNOSIS — Z79899 Other long term (current) drug therapy: Secondary | ICD-10-CM | POA: Diagnosis not present

## 2019-04-13 DIAGNOSIS — Z809 Family history of malignant neoplasm, unspecified: Secondary | ICD-10-CM | POA: Diagnosis not present

## 2019-04-13 DIAGNOSIS — O209 Hemorrhage in early pregnancy, unspecified: Secondary | ICD-10-CM | POA: Diagnosis present

## 2019-04-13 DIAGNOSIS — O021 Missed abortion: Secondary | ICD-10-CM

## 2019-04-13 LAB — CBC
HCT: 35.2 % — ABNORMAL LOW (ref 36.0–46.0)
Hemoglobin: 13.2 g/dL (ref 12.0–15.0)
MCH: 32 pg (ref 26.0–34.0)
MCHC: 37.5 g/dL — ABNORMAL HIGH (ref 30.0–36.0)
MCV: 85.4 fL (ref 80.0–100.0)
Platelets: 248 10*3/uL (ref 150–400)
RBC: 4.12 MIL/uL (ref 3.87–5.11)
RDW: 11.9 % (ref 11.5–15.5)
WBC: 7.7 10*3/uL (ref 4.0–10.5)
nRBC: 0 % (ref 0.0–0.2)

## 2019-04-13 LAB — URINALYSIS, ROUTINE W REFLEX MICROSCOPIC
Bilirubin Urine: NEGATIVE
Glucose, UA: NEGATIVE mg/dL
Ketones, ur: 5 mg/dL — AB
Leukocytes,Ua: NEGATIVE
Nitrite: NEGATIVE
Protein, ur: 30 mg/dL — AB
RBC / HPF: >50 RBC/hpf — ABNORMAL HIGH (ref 0–5)
Specific Gravity, Urine: 1.025 (ref 1.005–1.030)
pH: 7 (ref 5.0–8.0)

## 2019-04-13 LAB — HCG, QUANTITATIVE, PREGNANCY: hCG, Beta Chain, Quant, S: 13441 m[IU]/mL — ABNORMAL HIGH (ref <5)

## 2019-04-13 NOTE — Discharge Instructions (Signed)
Dilation and Curettage or Vacuum Curettage, Care After °These instructions give you information about caring for yourself after your procedure. Your doctor may also give you more specific instructions. Call your doctor if you have any problems or questions after your procedure. °Follow these instructions at home: °Activity °· Do not drive or use heavy machinery while taking prescription pain medicine. °· For 24 hours after your procedure, avoid driving. °· Take short walks often, followed by rest periods. Ask your doctor what activities are safe for you. After one or two days, you may be able to return to your normal activities. °· Do not lift anything that is heavier than 10 lb (4.5 kg) until your doctor approves. °· For at least 2 weeks, or as long as told by your doctor: °? Do not douche. °? Do not use tampons. °? Do not have sex. °General instructions ° °· Take over-the-counter and prescription medicines only as told by your doctor. This is very important if you take blood thinning medicine. °· Do not take baths, swim, or use a hot tub until your doctor approves. Take showers instead of baths. °· Wear compression stockings as told by your doctor. °· It is up to you to get the results of your procedure. Ask your doctor when your results will be ready. °· Keep all follow-up visits as told by your doctor. This is important. °Contact a doctor if: °· You have very bad cramps that get worse or do not get better with medicine. °· You have very bad pain in your belly (abdomen). °· You cannot drink fluids without throwing up (vomiting). °· You get pain in a different part of the area between your belly and thighs (pelvis). °· You have bad-smelling discharge from your vagina. °· You have a rash. °Get help right away if: °· You are bleeding a lot from your vagina. A lot of bleeding means soaking more than one sanitary pad in an hour, for 2 hours in a row. °· You have clumps of blood (blood clots) coming from your  vagina. °· You have a fever or chills. °· Your belly feels very tender or hard. °· You have chest pain. °· You have trouble breathing. °· You cough up blood. °· You feel dizzy. °· You feel light-headed. °· You pass out (faint). °· You have pain in your neck or shoulder area. °Summary °· Take short walks often, followed by rest periods. Ask your doctor what activities are safe for you. After one or two days, you may be able to return to your normal activities. °· Do not lift anything that is heavier than 10 lb (4.5 kg) until your doctor approves. °· Do not take baths, swim, or use a hot tub until your doctor approves. Take showers instead of baths. °· Contact your doctor if you have any symptoms of infection, like bad-smelling discharge from your vagina. °This information is not intended to replace advice given to you by your health care provider. Make sure you discuss any questions you have with your health care provider. °Document Released: 03/03/2008 Document Revised: 05/07/2017 Document Reviewed: 02/10/2016 °Elsevier Patient Education © 2020 Elsevier Inc. ° °

## 2019-04-13 NOTE — MAU Note (Signed)
Presents with VB and abdominal cramping.  Reports +UPT yesterday (04/12/19 @ urgent care).  Seen @ Urgent Care for increased vaginal discharge, treated for STI.  LMP either August 14 or 15, 2020.

## 2019-04-13 NOTE — MAU Provider Note (Signed)
History     CSN: [id number]  Arrival date and time: 04/13/19 1026   First Provider Initiated Contact with Patient 04/13/19 1128      Chief Complaint  Patient presents with  . Vaginal Bleeding   HPI Veronica Green is a 38 y.o. [id number] at [redacted]w[redacted]d who presents to MAU with chief complaint of vaginal bleeding and abdominal cramping. These are new problems, onset a few days ago. Her abdominal cramping is bilateral, rated as 5/10, does not radiate. She denies aggravating or alleviating factors. Her bleeding is light to moderate. She denies heavy vaginal bleeding, abdominal tenderness, weakness, syncope, fever or recent illness. She is remote from intercourse.  Her pregnancy was confirmed at Urgent Care yesterday and she was presumptively treated for STI exposure with Rocephin and Azithromycin.   OB History    Gravida  8   Para  5   Term  5   Preterm  0   AB  2   Living  5     SAB  0   TAB  1   Ectopic  0   Multiple  0   Live Births  5           Past Medical History:  Diagnosis Date  . Anxiety   . Ascites   . H/O unilateral oophorectomy 1.5 yrs ago   right   . Sickle cell trait ([place])   . Umbilical hernia     Past Surgical History:  Procedure Laterality Date  . DILATION AND CURETTAGE OF UTERUS    . HERNIA REPAIR    . RIGHT OOPHORECTOMY  07/2016  . TUMOR REMOVAL  07/2016    Family History  Problem Relation Age of Onset  . Cancer Maternal Grandmother   . Cancer Paternal Grandmother   . Asthma Mother   . Multiple sclerosis Sister   . Anesthesia problems Neg Hx   . Hypotension Neg Hx   . Malignant hyperthermia Neg Hx   . Pseudochol deficiency Neg Hx     Social History   Tobacco Use  . Smoking status: Former Smoker    Packs/day: 0.50    Types: Cigarettes    Quit date: 11/21/2015    Years since quitting: 3.3  . Smokeless tobacco: Never Used  . Tobacco comment: unable to smoke last 2 wks  Substance Use Topics  . Alcohol use: No    Comment: occ  .  Drug use: Yes    Types: Marijuana    Comment: stopped June 2020    Allergies: No Known Allergies  Medications Prior to Admission  Medication Sig Dispense Refill Last Dose  . Prenatal Vit-Fe Fumarate-FA (PREPLUS) 27-1 MG TABS Take 1 tablet by mouth daily. 30 tablet 0 04/12/2019 at 1900  . doxylamine, Sleep, (UNISOM) 25 MG tablet Take 1 tablet (25 mg total) by mouth 4 (four) times daily as needed (nausea and vomiting). 30 tablet 2   . promethazine (PHENERGAN) 25 MG tablet Take 1 tablet (25 mg total) by mouth every 6 (six) hours as needed for nausea or vomiting. 30 tablet 0   . pyridOXINE (VITAMIN B-6) 25 MG tablet Take 1 tablet (25 mg total) by mouth every 8 (eight) hours. 30 tablet 0   . scopolamine (TRANSDERM-SCOP) 1 MG/3DAYS Place 1 patch (1.5 mg total) onto the skin every 3 (three) days. 3 patch 1     Review of Systems  Constitutional: Negative for chills, fatigue and fever.  Respiratory: Negative for shortness of breath.   Gastrointestinal: Positive  for abdominal pain.  Genitourinary: Positive for vaginal bleeding. Negative for difficulty urinating and flank pain.  Musculoskeletal: Negative for back pain.  Neurological: Negative for dizziness, syncope and weakness.  All other systems reviewed and are negative.  Physical Exam   Blood pressure 98/63, pulse 73, temperature 98.6 F (37 C), temperature source Oral, resp. rate 18, height 5\' 3"  (1.6 m), weight 48.5 kg, last menstrual period 01/20/2019, SpO2 100 %, unknown if currently breastfeeding.  Physical Exam  Nursing note and vitals reviewed. Constitutional: She is oriented to person, place, and time. She appears well-developed and well-nourished.  Cardiovascular: Normal rate.  Respiratory: Effort normal and breath sounds normal.  GI: Soft. She exhibits no distension. There is no abdominal tenderness. There is no rebound and no guarding.  Genitourinary:    Vaginal discharge present.     Genitourinary Comments: Moderate dark red  bleeding noted on sterile speculum exam. Removed with fox swab x 1. Scant new bleeding afterwards. No adnexal tenderness or CMT on bimanual   Neurological: She is alert and oriented to person, place, and time.  Skin: Skin is warm and dry.  Psychiatric: She has a normal mood and affect. Her behavior is normal. Judgment and thought content normal.    MAU Course/MDM  Procedures  Patient Vitals for the past 24 hrs:  BP Temp Temp src Pulse Resp SpO2 Height Weight  04/13/19 1511 109/65 98.4 F (36.9 C) Oral 78 20 97 % - -  04/13/19 1048 98/63 98.6 F (37 C) Oral 73 18 100 % 5\' 3"  (1.6 m) 48.5 kg   Results for orders placed or performed during the hospital encounter of 04/13/19 (from the past 24 hour(s))  Urinalysis, Routine w reflex microscopic     Status: Abnormal   Collection Time: 04/13/19 11:29 AM  Result Value Ref Range   Color, Urine YELLOW YELLOW   APPearance HAZY (A) CLEAR   Specific Gravity, Urine 1.025 1.005 - 1.030   pH 7.0 5.0 - 8.0   Glucose, UA NEGATIVE NEGATIVE mg/dL   Hgb urine dipstick LARGE (A) NEGATIVE   Bilirubin Urine NEGATIVE NEGATIVE   Ketones, ur 5 (A) NEGATIVE mg/dL   Protein, ur 30 (A) NEGATIVE mg/dL   Nitrite NEGATIVE NEGATIVE   Leukocytes,Ua NEGATIVE NEGATIVE   RBC / HPF >50 (H) 0 - 5 RBC/hpf   WBC, UA 0-5 0 - 5 WBC/hpf   Bacteria, UA FEW (A) NONE SEEN   Squamous Epithelial / LPF 0-5 0 - 5   Mucus PRESENT   CBC     Status: Abnormal   Collection Time: 04/13/19  1:40 PM  Result Value Ref Range   WBC 7.7 4.0 - 10.5 K/uL   RBC 4.12 3.87 - 5.11 MIL/uL   Hemoglobin 13.2 12.0 - 15.0 g/dL   HCT 35.2 (L) 36.0 - 46.0 %   MCV 85.4 80.0 - 100.0 fL   MCH 32.0 26.0 - 34.0 pg   MCHC 37.5 (H) 30.0 - 36.0 g/dL   RDW 11.9 11.5 - 15.5 %   Platelets 248 150 - 400 K/uL   nRBC 0.0 0.0 - 0.2 %  hCG, quantitative, pregnancy     Status: Abnormal   Collection Time: 04/13/19  1:40 PM  Result Value Ref Range   hCG, Beta Chain, Quant, S 13,441 (H) <5 mIU/mL   US Ob  Less Than 14 Weeks With Ob Transvaginal  Result Date: 04/13/2019 CLINICAL DATA:  Positive urine pregnancy test with vaginal bleeding and pelvic cramping. EXAM: OBSTETRIC <14  WK [place] AND TRANSVAGINAL OB US TECHNIQUE: Both transabdominal and transvaginal ultrasound examinations were performed for complete evaluation of the gestation as well as the maternal uterus, adnexal regions, and pelvic cul-de-sac. Transvaginal technique was performed to assess early pregnancy. COMPARISON:  10/09/2018 FINDINGS: Intrauterine gestational sac: No intrauterine gestational sac is identified. There is a large amorphous area of cystic type changes in the endometrial canal bulging into the myometrium which is somewhat thinned. Blood flow around the lesion is noted. Findings suspicious for retained products of conception from prior pregnancy or molar pregnancy/gestational trophoblastic disease. Recommend correlation with beta HCG level. Maternal uterus/adnexae: The left ovary is normal. The right ovary is not visualized. IMPRESSION: 1. No normal intrauterine gestational sac is identified. 2. Amorphous appearing 5.4 x 3.6 cm area in the endometrial cavity extending into the myometrium which is thinned. 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 Recommend correlation with beta HCG level. 3. Normal left ovary.  Right ovary not visualized. Electronically Signed   By: Marijo Sanes M.D.   On: 04/13/2019 14:07   Assessment and Plan  --38 y.o. [id number] at [redacted]w[redacted]d  --S/p consult with Dr. Elly Modena for endometrial bulging. D&E advised --Discharge home in stable condition with bleeding precautions  F/U: --D&E scheduled for 04/17/19. Patient notified of procedure date via phone shortly after discharge  Darlina Rumpf, [place] 04/13/2019, 5:12 PM

## 2019-04-14 ENCOUNTER — Telehealth (HOSPITAL_COMMUNITY): Payer: Self-pay | Admitting: Emergency Medicine

## 2019-04-14 ENCOUNTER — Other Ambulatory Visit (HOSPITAL_COMMUNITY)
Admission: RE | Admit: 2019-04-14 | Discharge: 2019-04-14 | Disposition: A | Discharge status: Home / Self Care | Payer: Medicaid Other | Source: Ambulatory Visit | Attending: Obstetrics and Gynecology | Admitting: Obstetrics and Gynecology

## 2019-04-14 DIAGNOSIS — Z20828 Contact with and (suspected) exposure to other viral communicable diseases: Secondary | ICD-10-CM | POA: Diagnosis not present

## 2019-04-14 DIAGNOSIS — Z01812 Encounter for preprocedural laboratory examination: Secondary | ICD-10-CM | POA: Insufficient documentation

## 2019-04-14 LAB — CERVICOVAGINAL ANCILLARY ONLY
Bacterial vaginitis: POSITIVE — AB
Candida vaginitis: POSITIVE — AB
Chlamydia: NEGATIVE
Neisseria Gonorrhea: NEGATIVE
Trichomonas: NEGATIVE

## 2019-04-14 LAB — SARS CORONAVIRUS 2 (TAT 6-24 HRS): SARS Coronavirus 2: NEGATIVE

## 2019-04-14 MED ORDER — TERCONAZOLE 0.4 % VA CREA: 1.0000 | TOPICAL_CREAM | Freq: Every day | VAGINAL | 0 refills | Status: AC

## 2019-04-14 MED ORDER — METRONIDAZOLE 0.75 % VA GEL: 1.0000 | Freq: Every day | VAGINAL | 0 refills | Status: AC

## 2019-04-14 NOTE — Telephone Encounter (Signed)
Bacterial vaginosis is positive. This was not treated at the urgent care visit.  mettrogel  sent to patients pharmacy of choice.    Test for candida (yeast) was positive.  Prescription for terconazole, no refills, sent to the pharmacy of record.  Recheck or followup with PCP for further evaluation if symptoms are not improving.    Attempted to reach patient. No answer at this time. Mailbox full.

## 2019-04-16 ENCOUNTER — Encounter: Payer: Self-pay | Admitting: Obstetrics and Gynecology

## 2019-04-16 ENCOUNTER — Other Ambulatory Visit: Payer: Self-pay | Admitting: Obstetrics and Gynecology

## 2019-04-16 DIAGNOSIS — O2690 Pregnancy related conditions, unspecified, unspecified trimester: Secondary | ICD-10-CM

## 2019-04-17 ENCOUNTER — Ambulatory Visit (HOSPITAL_COMMUNITY)
Admission: RE | Admit: 2019-04-17 | Discharge: 2019-04-17 | Disposition: A | Discharge status: Home / Self Care | Payer: Medicaid Other | Attending: Obstetrics and Gynecology | Admitting: Obstetrics and Gynecology

## 2019-04-17 ENCOUNTER — Ambulatory Visit (HOSPITAL_COMMUNITY): Payer: Medicaid Other | Admitting: Certified Registered"

## 2019-04-17 ENCOUNTER — Other Ambulatory Visit: Payer: Self-pay

## 2019-04-17 ENCOUNTER — Ambulatory Visit (HOSPITAL_COMMUNITY)
Admission: RE | Admit: 2019-04-17 | Discharge: 2019-04-17 | Disposition: A | Discharge status: Home / Self Care | Payer: Medicaid Other | Source: Ambulatory Visit | Attending: Obstetrics and Gynecology | Admitting: Obstetrics and Gynecology

## 2019-04-17 ENCOUNTER — Telehealth: Payer: Self-pay | Admitting: Emergency Medicine

## 2019-04-17 ENCOUNTER — Encounter (HOSPITAL_COMMUNITY): Payer: Self-pay | Admitting: Obstetrics and Gynecology

## 2019-04-17 ENCOUNTER — Telehealth: Payer: Self-pay | Admitting: Obstetrics and Gynecology

## 2019-04-17 ENCOUNTER — Encounter (HOSPITAL_COMMUNITY)
Admission: RE | Disposition: A | Discharge status: Home / Self Care | Payer: Self-pay | Source: Home / Self Care | Attending: Obstetrics and Gynecology

## 2019-04-17 DIAGNOSIS — Z3A08 8 weeks gestation of pregnancy: Secondary | ICD-10-CM | POA: Insufficient documentation

## 2019-04-17 DIAGNOSIS — Z9889 Other specified postprocedural states: Secondary | ICD-10-CM

## 2019-04-17 DIAGNOSIS — Z87891 Personal history of nicotine dependence: Secondary | ICD-10-CM | POA: Diagnosis not present

## 2019-04-17 DIAGNOSIS — O021 Missed abortion: Secondary | ICD-10-CM

## 2019-04-17 DIAGNOSIS — O020 Blighted ovum and nonhydatidiform mole: Secondary | ICD-10-CM

## 2019-04-17 HISTORY — PX: DILATION AND EVACUATION: SHX1459

## 2019-04-17 HISTORY — PX: OPERATIVE ULTRASOUND: SHX5996

## 2019-04-17 LAB — CBC
HCT: 39.1 % (ref 36.0–46.0)
Hemoglobin: 14.1 g/dL (ref 12.0–15.0)
MCH: 31.8 pg (ref 26.0–34.0)
MCHC: 36.1 g/dL — ABNORMAL HIGH (ref 30.0–36.0)
MCV: 88.3 fL (ref 80.0–100.0)
Platelets: 313 10*3/uL (ref 150–400)
RBC: 4.43 MIL/uL (ref 3.87–5.11)
RDW: 11.9 % (ref 11.5–15.5)
WBC: 9.8 10*3/uL (ref 4.0–10.5)
nRBC: 0 % (ref 0.0–0.2)

## 2019-04-17 LAB — TYPE AND SCREEN
ABO/RH(D): O POS
Antibody Screen: NEGATIVE

## 2019-04-17 LAB — PROTIME-INR
INR: 1 (ref 0.8–1.2)
Prothrombin Time: 13 seconds (ref 11.4–15.2)

## 2019-04-17 LAB — COMPREHENSIVE METABOLIC PANEL
ALT: 21 U/L (ref 0–44)
AST: 21 U/L (ref 15–41)
Albumin: 3.7 g/dL (ref 3.5–5.0)
Alkaline Phosphatase: 45 U/L (ref 38–126)
Anion gap: 8 (ref 5–15)
BUN: 5 mg/dL — ABNORMAL LOW (ref 6–20)
CO2: 24 mmol/L (ref 22–32)
Calcium: 8.7 mg/dL — ABNORMAL LOW (ref 8.9–10.3)
Chloride: 105 mmol/L (ref 98–111)
Creatinine, Ser: 0.46 mg/dL (ref 0.44–1.00)
GFR calc Af Amer: >60 mL/min (ref >60)
GFR calc non Af Amer: >60 mL/min (ref >60)
Glucose, Bld: 74 mg/dL (ref 70–99)
Potassium: 3.8 mmol/L (ref 3.5–5.1)
Sodium: 137 mmol/L (ref 135–145)
Total Bilirubin: 0.4 mg/dL (ref 0.3–1.2)
Total Protein: 6.6 g/dL (ref 6.5–8.1)

## 2019-04-17 LAB — APTT: aPTT: 35 seconds (ref 24–36)

## 2019-04-17 LAB — ABO/RH: ABO/RH(D): O POS

## 2019-04-17 LAB — FIBRINOGEN: Fibrinogen: 354 mg/dL (ref 210–475)

## 2019-04-17 LAB — HCG, QUANTITATIVE, PREGNANCY: hCG, Beta Chain, Quant, S: 5888 m[IU]/mL — ABNORMAL HIGH (ref <5)

## 2019-04-17 SURGERY — DILATION AND EVACUATION, UTERUS
Anesthesia: General

## 2019-04-17 MED ORDER — METHYLERGONOVINE MALEATE 0.2 MG/ML IJ SOLN
INTRAMUSCULAR | Status: AC
  Filled 2019-04-17: qty 1

## 2019-04-17 MED ORDER — LIDOCAINE HCL (PF) 1 % IJ SOLN
INTRAMUSCULAR | Status: AC
  Filled 2019-04-17: qty 30

## 2019-04-17 MED ORDER — LACTATED RINGERS IV SOLN
INTRAVENOUS | Status: DC | PRN
  Administered 2019-04-17: 11:00:00 via INTRAVENOUS

## 2019-04-17 MED ORDER — SUCCINYLCHOLINE CHLORIDE 200 MG/10ML IV SOSY
PREFILLED_SYRINGE | INTRAVENOUS | Status: AC
  Filled 2019-04-17: qty 10

## 2019-04-17 MED ORDER — TRANEXAMIC ACID-NACL 1000-0.7 MG/100ML-% IV SOLN
INTRAVENOUS | Status: AC
  Filled 2019-04-17: qty 100

## 2019-04-17 MED ORDER — OXYCODONE HCL 5 MG/5ML PO SOLN: 5.0000 mg | Freq: Once | ORAL | Status: DC | PRN

## 2019-04-17 MED ORDER — LIDOCAINE 2% (20 MG/ML) 5 ML SYRINGE
INTRAMUSCULAR | Status: DC | PRN
  Administered 2019-04-17: 60 mg via INTRAVENOUS

## 2019-04-17 MED ORDER — LACTATED RINGERS IV SOLN: INTRAVENOUS | Status: DC

## 2019-04-17 MED ORDER — 0.9 % SODIUM CHLORIDE (POUR BTL) OPTIME
TOPICAL | Status: DC | PRN
  Administered 2019-04-17: 1000 mL

## 2019-04-17 MED ORDER — SODIUM CHLORIDE 0.9 % IV SOLN
100.0000 mg | Freq: Two times a day (BID) | INTRAVENOUS | Status: DC
  Administered 2019-04-17: 100 mg via INTRAVENOUS
  Filled 2019-04-17: qty 100

## 2019-04-17 MED ORDER — SILVER NITRATE-POT NITRATE 75-25 % EX MISC
CUTANEOUS | Status: AC
  Filled 2019-04-17: qty 10

## 2019-04-17 MED ORDER — MEPERIDINE HCL 25 MG/ML IJ SOLN: 6.2500 mg | INTRAMUSCULAR | Status: DC | PRN

## 2019-04-17 MED ORDER — ACETAMINOPHEN 160 MG/5ML PO SOLN: 325.0000 mg | ORAL | Status: DC | PRN

## 2019-04-17 MED ORDER — OXYCODONE-ACETAMINOPHEN 5-325 MG PO TABS: 1.0000 | ORAL_TABLET | Freq: Four times a day (QID) | ORAL | 0 refills | Status: DC | PRN

## 2019-04-17 MED ORDER — LIDOCAINE HCL 1 % IJ SOLN
INTRAMUSCULAR | Status: DC | PRN
  Administered 2019-04-17: 20 mL

## 2019-04-17 MED ORDER — CARBOPROST TROMETHAMINE 250 MCG/ML IM SOLN
INTRAMUSCULAR | Status: AC
  Filled 2019-04-17: qty 1

## 2019-04-17 MED ORDER — ONDANSETRON HCL 4 MG/2ML IJ SOLN
INTRAMUSCULAR | Status: AC
  Filled 2019-04-17: qty 2

## 2019-04-17 MED ORDER — FENTANYL CITRATE (PF) 250 MCG/5ML IJ SOLN
INTRAMUSCULAR | Status: AC
  Filled 2019-04-17: qty 5

## 2019-04-17 MED ORDER — DEXAMETHASONE SODIUM PHOSPHATE 10 MG/ML IJ SOLN
INTRAMUSCULAR | Status: DC | PRN
  Administered 2019-04-17: 8 mg via INTRAVENOUS

## 2019-04-17 MED ORDER — TRANEXAMIC ACID-NACL 1000-0.7 MG/100ML-% IV SOLN
1000.0000 mg | INTRAVENOUS | Status: AC
  Administered 2019-04-17: 1000 mg via INTRAVENOUS

## 2019-04-17 MED ORDER — DOCUSATE SODIUM 100 MG PO CAPS: 100.0000 mg | ORAL_CAPSULE | Freq: Two times a day (BID) | ORAL | 0 refills | Status: DC | PRN

## 2019-04-17 MED ORDER — METHYLERGONOVINE MALEATE 0.2 MG/ML IJ SOLN
0.2000 mg | Freq: Once | INTRAMUSCULAR | Status: AC
  Administered 2019-04-17: 13:00:00 0.2 mg via INTRAMUSCULAR
  Filled 2019-04-17: qty 1

## 2019-04-17 MED ORDER — PROPOFOL 10 MG/ML IV BOLUS
INTRAVENOUS | Status: AC
  Filled 2019-04-17: qty 20

## 2019-04-17 MED ORDER — FENTANYL CITRATE (PF) 100 MCG/2ML IJ SOLN
INTRAMUSCULAR | Status: DC | PRN
  Administered 2019-04-17: 100 ug via INTRAVENOUS
  Administered 2019-04-17 (×2): 50 ug via INTRAVENOUS

## 2019-04-17 MED ORDER — MIDAZOLAM HCL 2 MG/2ML IJ SOLN
INTRAMUSCULAR | Status: AC
  Filled 2019-04-17: qty 2

## 2019-04-17 MED ORDER — DEXAMETHASONE SODIUM PHOSPHATE 10 MG/ML IJ SOLN
INTRAMUSCULAR | Status: AC
  Filled 2019-04-17: qty 1

## 2019-04-17 MED ORDER — SOD CITRATE-CITRIC ACID 500-334 MG/5ML PO SOLN
30.0000 mL | ORAL | Status: DC
  Filled 2019-04-17: qty 30

## 2019-04-17 MED ORDER — IBUPROFEN 600 MG PO TABS: 600.0000 mg | ORAL_TABLET | Freq: Four times a day (QID) | ORAL | 0 refills | Status: DC | PRN

## 2019-04-17 MED ORDER — ACETAMINOPHEN 325 MG PO TABS: 325.0000 mg | ORAL_TABLET | ORAL | Status: DC | PRN

## 2019-04-17 MED ORDER — LIDOCAINE 2% (20 MG/ML) 5 ML SYRINGE
INTRAMUSCULAR | Status: AC
  Filled 2019-04-17: qty 5

## 2019-04-17 MED ORDER — SUCCINYLCHOLINE CHLORIDE 200 MG/10ML IV SOSY
PREFILLED_SYRINGE | INTRAVENOUS | Status: DC | PRN
  Administered 2019-04-17: 50 mg via INTRAVENOUS

## 2019-04-17 MED ORDER — ONDANSETRON HCL 4 MG/2ML IJ SOLN: 4.0000 mg | Freq: Once | INTRAMUSCULAR | Status: DC | PRN

## 2019-04-17 MED ORDER — MIDAZOLAM HCL 5 MG/5ML IJ SOLN
INTRAMUSCULAR | Status: DC | PRN
  Administered 2019-04-17: 2 mg via INTRAVENOUS

## 2019-04-17 MED ORDER — LACTATED RINGERS IV SOLN
INTRAVENOUS | Status: DC
  Administered 2019-04-17: 11:00:00 via INTRAVENOUS

## 2019-04-17 MED ORDER — ONDANSETRON HCL 4 MG/2ML IJ SOLN
INTRAMUSCULAR | Status: DC | PRN
  Administered 2019-04-17: 4 mg via INTRAVENOUS

## 2019-04-17 MED ORDER — OXYCODONE HCL 5 MG PO TABS: 5.0000 mg | ORAL_TABLET | Freq: Once | ORAL | Status: DC | PRN

## 2019-04-17 MED ORDER — FENTANYL CITRATE (PF) 100 MCG/2ML IJ SOLN: 25.0000 ug | INTRAMUSCULAR | Status: DC | PRN

## 2019-04-17 MED ORDER — SODIUM CHLORIDE 0.9 % IV SOLN
100.0000 mg | Freq: Once | INTRAVENOUS | Status: DC
  Filled 2019-04-17: qty 100

## 2019-04-17 MED ORDER — OXYTOCIN 10 UNIT/ML IJ SOLN
INTRAMUSCULAR | Status: AC
  Filled 2019-04-17: qty 2

## 2019-04-17 MED ORDER — PROPOFOL 10 MG/ML IV BOLUS
INTRAVENOUS | Status: DC | PRN
  Administered 2019-04-17 (×2): 100 mg via INTRAVENOUS

## 2019-04-17 MED ORDER — MISOPROSTOL 200 MCG PO TABS
ORAL_TABLET | ORAL | Status: AC
  Filled 2019-04-17: qty 5

## 2019-04-17 SURGICAL SUPPLY — 24 items
CATH ROBINSON RED A/P 16FR (CATHETERS) ×3 IMPLANT
DECANTER SPIKE VIAL GLASS SM (MISCELLANEOUS) ×3 IMPLANT
FILTER UTR ASPR ASSEMBLY (MISCELLANEOUS) ×3 IMPLANT
GLOVE BIOGEL PI IND STRL 7.0 (GLOVE) ×1 IMPLANT
GLOVE BIOGEL PI IND STRL 7.5 (GLOVE) ×1 IMPLANT
GLOVE BIOGEL PI INDICATOR 7.0 (GLOVE) ×2
GLOVE BIOGEL PI INDICATOR 7.5 (GLOVE) ×2
GLOVE NEODERM STER SZ 7 (GLOVE) ×3 IMPLANT
GOWN STRL REUS W/ TWL LRG LVL3 (GOWN DISPOSABLE) ×1 IMPLANT
GOWN STRL REUS W/ TWL XL LVL3 (GOWN DISPOSABLE) ×1 IMPLANT
GOWN STRL REUS W/TWL LRG LVL3 (GOWN DISPOSABLE) ×2
GOWN STRL REUS W/TWL XL LVL3 (GOWN DISPOSABLE) ×2
HOSE CONNECTING 18IN BERKELEY (TUBING) ×3 IMPLANT
KIT BERKELEY 1ST TRIMESTER 3/8 (MISCELLANEOUS) ×3 IMPLANT
NS IRRIG 1000ML POUR BTL (IV SOLUTION) ×3 IMPLANT
PACK VAGINAL MINOR WOMEN LF (CUSTOM PROCEDURE TRAY) ×3 IMPLANT
PAD OB MATERNITY 4.3X12.25 (PERSONAL CARE ITEMS) ×3 IMPLANT
SET BERKELEY SUCTION TUBING (SUCTIONS) ×3 IMPLANT
TOWEL GREEN STERILE FF (TOWEL DISPOSABLE) ×6 IMPLANT
VACURETTE 10 RIGID CVD (CANNULA) IMPLANT
VACURETTE 12 RIGID CVD (CANNULA) ×3 IMPLANT
VACURETTE 7MM CVD STRL WRAP (CANNULA) IMPLANT
VACURETTE 8 RIGID CVD (CANNULA) IMPLANT
VACURETTE 9 RIGID CVD (CANNULA) IMPLANT

## 2019-04-17 NOTE — Transfer of Care (Signed)
Immediate Anesthesia Transfer of Care Note  Patient: Veronica Green  Procedure(s) Performed: SUCTION DILATATION AND EVACUATION (N/A ) OPERATIVE ULTRASOUND (N/A )  Patient Location: PACU  Anesthesia Type:General  Level of Consciousness: drowsy and patient cooperative  Airway & Oxygen Therapy: Patient Spontanous Breathing and Patient connected to nasal cannula oxygen  Post-op Assessment: Report given to RN and Post -op Vital signs reviewed and stable  Post vital signs: Reviewed and stable  Last Vitals:  Vitals Value Taken Time  BP    Temp    Pulse 67 04/17/19 1344  Resp 17 04/17/19 1344  SpO2 100 % 04/17/19 1344  Vitals shown include unvalidated device data.  Last Pain:  Vitals:   04/17/19 1127  TempSrc:   PainSc: 4          Complications: No apparent anesthesia complications

## 2019-04-17 NOTE — Telephone Encounter (Signed)
Attempted to reach patient x2. No answer at this time. Voicemail full. Sent SMS notification.

## 2019-04-17 NOTE — Anesthesia Postprocedure Evaluation (Signed)
Anesthesia Post Note  Patient: Veronica Green  Procedure(s) Performed: SUCTION DILATATION AND EVACUATION (N/A ) OPERATIVE ULTRASOUND (N/A )     Patient location during evaluation: PACU Anesthesia Type: General Level of consciousness: awake and alert Pain management: pain level controlled Vital Signs Assessment: post-procedure vital signs reviewed and stable Respiratory status: spontaneous breathing, nonlabored ventilation, respiratory function stable and patient connected to nasal cannula oxygen Cardiovascular status: blood pressure returned to baseline and stable Postop Assessment: no apparent nausea or vomiting Anesthetic complications: no    Last Vitals:  Vitals:   04/17/19 1415 04/17/19 1429  BP: 116/77 112/78  Pulse: 67 71  Resp: 14 14  Temp: 36.4 C   SpO2: 100% 100%    Last Pain:  Vitals:   04/17/19 1429  TempSrc:   PainSc: Asleep                 Luretta Everly P Johniya Durfee

## 2019-04-17 NOTE — Telephone Encounter (Signed)
GYN Telephone Note Patient called and told about switch in location to main OR and to go to [place] entrance and ask them to show her where to go for "short stay". Patient confirms NPO after MN and was told to come at 42. I told her to come at 10 since additional blood work that needs to be done. All questions asked and answered.  Durene Romans MD Attending Center for [facility] (Faculty Practice) 04/17/2019 Time: [id number]

## 2019-04-17 NOTE — H&P (Signed)
Obstetrics & Gynecology Surgical H&P   Date of Surgery: 04/17/2019   Primary OBGYN: None Primary Care Provider: Patient, No Pcp Per  Reason for Admission: scheduled d&c for possible molar pregnancy  History of Present Illness: Veronica Green is a 38 y.o. [id number] (Patient's last menstrual period was 01/20/2019.), with the above CC. PMHx is significant for AMA.    Patient presented on 11/5 for VB and +pregnancy test. Beta hcg 13,441 and u/s showed 4-5c cystic area in the uterus concerning for molar pregnancy (see below). Pt deemed stable for outpatient management. She is Rh pos  Today, she is doing well and not currently having issues.   ROS: A 12-point review of systems was performed and negative, except as stated in the above HPI.  OBGYN History: As per HPI. OB History  Gravida Para Term Preterm AB Living  8 5 5  0 2 5  SAB TAB Ectopic Multiple Live Births  0 1 0 0 5    # Outcome Date GA Lbr Len/2nd Weight Sex Delivery Anes PTL Lv  8 Current           7 AB 10/11/18 103w2d         6 Term 01/09/17 [redacted]w[redacted]d 05:49 / 00:12 2940 g M Vag-Spont None  LIV  5 Term 09/14/14 [redacted]w[redacted]d 12:30 / 00:14 3215 g F Vag-Spont None  LIV  4 Term 05/06/11 109w4d 07:27 / 00:11 2665 g  Vag-Spont None  LIV  3 Term 07/13/09     Vag-Spont  N LIV  2 TAB 2010 [redacted]w[redacted]d         1 Term 11/11/99     Vag-Spont  N LIV   Past Medical History: Past Medical History:  Diagnosis Date  . Anxiety   . Ascites   . Sickle cell trait ([place])   . Umbilical hernia     Past Surgical History: Past Surgical History:  Procedure Laterality Date  . DILATION AND CURETTAGE OF UTERUS    . RIGHT OOPHORECTOMY  07/2016   diagnostic laparoscopy converted to ex lap w/ umbilical hernia repair @ [facility] (benign)  . UMBILICAL HERNIA REPAIR  07/2016   [facility]    Family History:  Family History  Problem Relation Age of Onset  . Cancer Maternal Grandmother   . Cancer Paternal Grandmother   . Asthma Mother   . Multiple sclerosis Sister   . Anesthesia  problems Neg Hx   . Hypotension Neg Hx   . Malignant hyperthermia Neg Hx   . Pseudochol deficiency Neg Hx     Social History:  Social History   Socioeconomic History  . Marital status: Single    Spouse name: Not on file  . Number of children: Not on file  . Years of education: Not on file  . Highest education level: Not on file  Occupational History  . Not on file  Social Needs  . Financial resource strain: Not on file  . Food insecurity    Worry: Not on file    Inability: Not on file  . Transportation needs    Medical: Not on file    Non-medical: Not on file  Tobacco Use  . Smoking status: Former Smoker    Packs/day: 0.50    Types: Cigarettes    Quit date: 11/21/2015    Years since quitting: 3.4  . Smokeless tobacco: Never Used  . Tobacco comment: unable to smoke last 2 wks  Substance and Sexual Activity  . Alcohol use: No    Comment:  occ  . Drug use: Yes    Types: Marijuana    Comment: stopped June 2020  . Sexual activity: Yes    Partners: Male    Birth control/protection: Abstinence, None  Lifestyle  . Physical activity    Days per week: Not on file    Minutes per session: Not on file  . Stress: Not on file  Relationships  . Social Herbalist on phone: Not on file    Gets together: Not on file    Attends religious service: Not on file    Active member of club or organization: Not on file    Attends meetings of clubs or organizations: Not on file    Relationship status: Not on file  . Intimate partner violence    Fear of current or ex partner: Not on file    Emotionally abused: Not on file    Physically abused: Not on file    Forced sexual activity: Not on file  Other Topics Concern  . Not on file  Social History Narrative  . Not on file   Allergy: No Known Allergies  Current Outpatient Medications: Medications Prior to Admission  Medication Sig Dispense Refill Last Dose  . metroNIDAZOLE (METROGEL VAGINAL) 0.75 % vaginal gel Place 1  Applicatorful vaginally at bedtime for 5 days. 50 g 0   . pyridOXINE (VITAMIN B-6) 25 MG tablet Take 1 tablet (25 mg total) by mouth every 8 (eight) hours. 30 tablet 0   . terconazole (TERAZOL 7) 0.4 % vaginal cream Place 1 applicator vaginally at bedtime for 7 days. 45 g 0      Hospital Medications: Current Facility-Administered Medications  Medication Dose Route Frequency Provider Last Rate Last Dose  . doxycycline (VIBRAMYCIN) 100 mg in sodium chloride 0.9 % 250 mL IVPB  100 mg Intravenous Q12H Aletha Halim, MD      . lactated ringers infusion   Intravenous Continuous Lillia Abed, MD      . lactated ringers infusion   Intravenous Continuous Janeece Riggers, MD 10 mL/hr at 04/17/19 1104    . lactated ringers infusion   Intravenous Continuous Aletha Halim, MD      . methylergonovine (METHERGINE) injection 0.2 mg  0.2 mg Intramuscular Once Aletha Halim, MD      . sodium citrate-citric acid (ORACIT) solution 30 mL  30 mL Oral On Call to [place], MD      . tranexamic acid (CYKLOKAPRON) 1000MG /146mL IVPB           . tranexamic acid (CYKLOKAPRON) IVPB 1,000 mg  1,000 mg Intravenous To OR Aletha Halim, MD         Physical Exam:  Current Vital Signs 24h Vital Sign Ranges  T 98.1 F (36.7 C) Temp  Avg: 98.1 F (36.7 C)  Min: 98.1 F (36.7 C)  Max: 98.1 F (36.7 C)  BP 90/60 BP  Min: 90/60  Max: 90/60  HR 69 Pulse  Avg: 69  Min: 69  Max: 69  RR 20 Resp  Avg: 20  Min: 20  Max: 20  SaO2 100 %   SpO2  Avg: 100 %  Min: 100 %  Max: 100 %       24 Hour I/O Current Shift I/O  Time Ins Outs No intake/output data recorded. No intake/output data recorded.    Body mass index is 18.94 kg/m. General appearance: Well nourished, well developed female in no acute distress.  Cardiovascular: S1, S2 normal, no murmur,  rub or gallop, regular rate and rhythm Respiratory:  Clear to auscultation bilateral. Normal respiratory effort Abdomen: positive bowel sounds and no masses,  hernias; diffusely non tender to palpation, non distended Neuro/Psych:  Normal mood and affect.  Skin:  Warm and dry.  Extremities: no clubbing, cyanosis, or edema.    Laboratory: Pending: t&s, hcg, coags and fibrinogen, cmp Recent Labs  Lab 04/13/19 1340 04/17/19 1036  WBC 7.7 9.8  HGB 13.2 14.1  HCT 35.2* 39.1  PLT 248 313    Imaging:  CLINICAL DATA:  Positive urine pregnancy test with vaginal bleeding and pelvic cramping.  EXAM: OBSTETRIC <14 WK [place] AND TRANSVAGINAL OB US  TECHNIQUE: Both transabdominal and transvaginal ultrasound examinations were performed for complete evaluation of the gestation as well as the maternal uterus, adnexal regions, and pelvic cul-de-sac. Transvaginal technique was performed to assess early pregnancy.  COMPARISON:  10/09/2018  FINDINGS: Intrauterine gestational sac: No intrauterine gestational sac is identified. There is a large amorphous area of cystic type changes in the endometrial canal bulging into the myometrium which is somewhat thinned. Blood flow around the lesion is noted. Findings suspicious for retained products of conception from prior pregnancy or molar pregnancy/gestational trophoblastic disease. Recommend correlation with beta HCG level.  Maternal uterus/adnexae: The left ovary is normal. The right ovary is not visualized.  IMPRESSION: 1. No normal intrauterine gestational sac is identified. 2. Amorphous appearing 5.4 x 3.6 cm area in the endometrial cavity extending into the myometrium which is thinned. 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 Recommend correlation with beta HCG level. 3. Normal left ovary.  Right ovary not visualized.   Electronically Signed   By: Marijo Sanes M.D.   On: 04/13/2019 14:07  Assessment: Ms. Scheler is a 38 y.o. [id number] (Patient's last menstrual period was 01/20/2019.) here for surgical management for possible molar pregnancy; pt  stable  Plan: D/w pt re: procedure and concern for molar pregnancy that could lead massive bleeding, with risk of needing further surgery, including hysterectomy, but surgical management is only option. Can proceed when labs are back.   Durene Romans MD Attending Center for [facility])

## 2019-04-17 NOTE — Anesthesia Preprocedure Evaluation (Addendum)
Anesthesia Evaluation  Patient identified by MRN, date of birth, ID band Patient awake    Reviewed: Allergy & Precautions, H&P , NPO status , Patient's Chart, lab work & pertinent test results, reviewed documented beta blocker date and time   Airway Mallampati: I  TM Distance: >3 FB Neck ROM: full    Dental no notable dental hx. (+) Teeth Intact, Poor Dentition,    Pulmonary former smoker,    Pulmonary exam normal breath sounds clear to auscultation       Cardiovascular Exercise Tolerance: Good negative cardio ROS   Rhythm:regular Rate:Normal     Neuro/Psych Anxiety negative neurological ROS     GI/Hepatic negative GI ROS, Neg liver ROS,   Endo/Other  negative endocrine ROS  Renal/GU negative Renal ROS  negative genitourinary   Musculoskeletal   Abdominal   Peds  Hematology  (+) Blood dyscrasia, Sickle cell trait ,   Anesthesia Other Findings   Reproductive/Obstetrics (+) Pregnancy                            Anesthesia Physical Anesthesia Plan  ASA: II and emergent  Anesthesia Plan: General   Post-op Pain Management:    Induction:   PONV Risk Score and Plan: 3 and Ondansetron, Dexamethasone and Treatment may vary due to age or medical condition  Airway Management Planned: Oral ETT and LMA  Additional Equipment:   Intra-op Plan:   Post-operative Plan:   Informed Consent: I have reviewed the patients History and Physical, chart, labs and discussed the procedure including the risks, benefits and alternatives for the proposed anesthesia with the patient or authorized representative who has indicated his/her understanding and acceptance.     Dental Advisory Given  Plan Discussed with: CRNA, Anesthesiologist and Surgeon  Anesthesia Plan Comments:       Anesthesia Quick Evaluation

## 2019-04-17 NOTE — Op Note (Addendum)
Operative Note   04/17/2019  PRE-OP DIAGNOSIS: concern for molar pregnancy   POST-OP DIAGNOSIS: same  SURGEON: Surgeon(s) and Role:    Aletha Halim, MD - Primary  ASSISTANT: none  PROCEDURE:  Suction dilation and curettage with ultrasound guidance  ANESTHESIA: General and paracervical block  ESTIMATED BLOOD LOSS: 24mL  DRAINS: I/O cath per anesthesia note  TOTAL IV FLUIDS: per anesthesia note  SPECIMENS: products of conception to pathology  VTE PROPHYLAXIS: SCDs to the bilateral lower extremities  ANTIBIOTICS: Doxycycline 100mg  IV x 1 pre op  COMPLICATIONS: none  DISPOSITION: PACU - hemodynamically stable.  CONDITION: stable  BLOOD TYPE: Conflict (See Lab Report): O POS/O POS Performed at [facility], [place] [address], [place],  [address] Rhogam given:no  FINDINGS: Exam under anesthesia revealed 8 week sized uterus with no masses and bilateral adnexa without masses or fullness. Necrotic appearing products of conception were seen, with gritty texture in all four quadrants, and thin avascular stripe on ultrasound noted at the end of the case  PROCEDURE IN DETAIL:  After informed consent was obtained, the patient was taken to the operating room where anesthesia was obtained without difficulty. The patient was positioned in the dorsal lithotomy position in [place]. Lysteda IV was given, and then the patient was examined under anesthesia, with the above noted findings.  The bi-valved speculum was placed inside the patient's vagina, and the the anterior lip of the cervix was seen and grasped with the tenaculum.  A paracervical block was achieved with 80mL of 1% lidocaine and the cervix already was dilated enough for a 82mm cannula. The suction was then calibrated to 49mmHg and connected to the cannula, which was then introduced with the above noted findings. A gentle curettage was done at the end and yield no products of conception.   The suction was then  done one more time to remove any remaining curettage material. Ultrasound was used during the entirety of the above procedure. Prophylactic bimanual massage and IM methergine were then given.  Excellent hemostasis was noted, and all instruments were removed, with excellent hemostasis noted throughout.  She was then taken out of dorsal lithotomy. The patient tolerated the procedure well.  Sponge, lap and instrument counts were correct x2.  The patient was taken to recovery room in excellent condition.  Durene Romans MD Attending Center for [facility])

## 2019-04-17 NOTE — Anesthesia Procedure Notes (Signed)
Procedure Name: Intubation Date/Time: 04/17/2019 12:54 PM Performed by: Orlie Dakin, CRNA Pre-anesthesia Checklist: Patient identified, Emergency Drugs available, Suction available and Patient being monitored Patient Re-evaluated:Patient Re-evaluated prior to induction Oxygen Delivery Method: Circle system utilized Preoxygenation: Pre-oxygenation with 100% oxygen Induction Type: IV induction Ventilation: Mask ventilation without difficulty Laryngoscope Size: Mac and 4 Grade View: Grade II Tube type: Oral Tube size: 7.0 mm Number of attempts: 1 Airway Equipment and Method: Stylet Placement Confirmation: ETT inserted through vocal cords under direct vision,  positive ETCO2 and breath sounds checked- equal and bilateral Secured at: 21 cm Tube secured with: Tape Dental Injury: Teeth and Oropharynx as per pre-operative assessment  Comments: 4x4s bite block used.

## 2019-04-18 ENCOUNTER — Encounter (HOSPITAL_COMMUNITY): Payer: Self-pay | Admitting: Obstetrics and Gynecology

## 2019-04-18 LAB — SURGICAL PATHOLOGY

## 2019-04-19 ENCOUNTER — Telehealth: Payer: Self-pay | Admitting: Obstetrics and Gynecology

## 2019-04-19 ENCOUNTER — Other Ambulatory Visit: Payer: Self-pay | Admitting: Obstetrics and Gynecology

## 2019-04-19 DIAGNOSIS — O020 Blighted ovum and nonhydatidiform mole: Secondary | ICD-10-CM | POA: Insufficient documentation

## 2019-04-19 NOTE — Telephone Encounter (Signed)
Attempted to contact patient with her stat bhcg appointments. No answer number went straight to voicemail. Unable to leave a voicemail due to the mailbox being full

## 2019-04-19 NOTE — Progress Notes (Signed)
GYN Telephone Note Patient called at [phone] and it went straight to VM. Will have clinic contact pt re: molar pregnancy and need for weekly stat beta hcg until undetectable for three weeks and then every month for one to three months  Durene Romans MD Attending Center for [facility] (Faculty Practice) 04/19/2019 Time: [id number]

## 2019-04-20 ENCOUNTER — Telehealth: Payer: Self-pay | Admitting: *Deleted

## 2019-04-20 NOTE — Telephone Encounter (Signed)
-----   Message from Aletha Halim, MD sent at 04/19/2019  1:48 PM EST ----- Can y'all try and call, too, and let her know that she has a molar pregnancy and needs stat weekly beta hcg for the next few weeks? thanks

## 2019-04-20 NOTE — Telephone Encounter (Signed)
I called [facility] and was unable to leave a message- I heard a message voicemail is full and cannot accept messages. Linda,RN

## 2019-04-20 NOTE — Telephone Encounter (Signed)
I called [place] and was unable to leave a message. Linda,RN

## 2019-04-21 NOTE — Telephone Encounter (Signed)
I called Jos[place]ine and was unable to leave a message - I heard a message voicemail box is full. I called her contact number and left a message we are trying to reach [place] and you are listed as a contact- please give her message we are trying to reach her with important information and to please call our office asap.  Will send letter also. Linda,RN

## 2019-04-24 ENCOUNTER — Telehealth: Payer: Self-pay | Admitting: General Practice

## 2019-04-24 ENCOUNTER — Ambulatory Visit (INDEPENDENT_AMBULATORY_CARE_PROVIDER_SITE_OTHER): Payer: Self-pay | Admitting: General Practice

## 2019-04-24 ENCOUNTER — Other Ambulatory Visit: Payer: Self-pay

## 2019-04-24 DIAGNOSIS — O020 Blighted ovum and nonhydatidiform mole: Secondary | ICD-10-CM

## 2019-04-24 LAB — BETA HCG QUANT (REF LAB): hCG Quant: 135 m[IU]/mL

## 2019-04-24 NOTE — Telephone Encounter (Signed)
Patient called and left message on nurse voicemail line stating she is returning our phone calls.   Called patient and discussed missed stat bhcg appt from this morning. She states she wasn't aware but will be here this afternoon around 145. Discussed pathology from surgery shows molar pregnancy- tried to discuss with patient but phone kept disconnecting. Will discuss when patient comes into the office later.

## 2019-04-24 NOTE — Progress Notes (Signed)
Patient presents to office today for stat bhcg following dx molar pregnancy. Patient reports bleeding has stopped and reports only mild cramps. Discussed weekly quants with patient until levels reach 0 then will need monthly quants. Told patient Dr Ilda Basset will discuss results from surgery in more depth at her follow up appt with him. Told patient I will call her in a couple hours with today's results. Patient verbalized understanding & had no questions at this time.  bhcg 135 today- was 5,888 on 11/9 day of surgery. Called patient & informed her of results and reviewed scheduled appt on Monday 11/23 for repeat bhcg. Patient verbalized understanding & will be here then. Patient had no questions.  Koren Bound RN BSN 04/24/19

## 2019-04-25 NOTE — Progress Notes (Signed)
Patient seen and assessed by nursing staff during this encounter. I have reviewed the chart and agree with the documentation and plan.  Aletha Halim, MD 04/25/2019 8:28 AM

## 2019-04-27 ENCOUNTER — Other Ambulatory Visit: Payer: Self-pay | Admitting: *Deleted

## 2019-04-27 DIAGNOSIS — O020 Blighted ovum and nonhydatidiform mole: Secondary | ICD-10-CM

## 2019-05-01 ENCOUNTER — Ambulatory Visit: Payer: Self-pay

## 2019-05-01 ENCOUNTER — Other Ambulatory Visit: Payer: Self-pay

## 2019-05-01 DIAGNOSIS — O020 Blighted ovum and nonhydatidiform mole: Secondary | ICD-10-CM

## 2019-05-02 ENCOUNTER — Other Ambulatory Visit: Payer: Self-pay | Admitting: *Deleted

## 2019-05-02 DIAGNOSIS — O020 Blighted ovum and nonhydatidiform mole: Secondary | ICD-10-CM

## 2019-05-02 LAB — BETA HCG QUANT (REF LAB): hCG Quant: 28 m[IU]/mL

## 2019-05-03 ENCOUNTER — Telehealth: Payer: Self-pay

## 2019-05-03 NOTE — Telephone Encounter (Addendum)
-----   Message from Aletha Halim, MD sent at 05/03/2019  7:41 AM EST ----- Her number is coming down well. Just advise her to keep her upcoming appts. thanks  Notified pt of results and to keep her appt on 05/08/19 at 1000 to make sure that her pregnancy levels get to zero.  I also informed pt of her virtual appt scheduled on 05/12/19 with Dr. Ilda Basset.  Pt verbalized understanding with no further questions.   Mel Almond, RN 05/03/19

## 2019-05-08 ENCOUNTER — Emergency Department (HOSPITAL_COMMUNITY): Payer: Medicaid Other

## 2019-05-08 ENCOUNTER — Other Ambulatory Visit: Payer: Self-pay

## 2019-05-08 ENCOUNTER — Encounter (HOSPITAL_COMMUNITY): Payer: Self-pay | Admitting: Emergency Medicine

## 2019-05-08 ENCOUNTER — Ambulatory Visit: Payer: Self-pay

## 2019-05-08 ENCOUNTER — Emergency Department (HOSPITAL_COMMUNITY)
Admission: EM | Admit: 2019-05-08 | Discharge: 2019-05-08 | Disposition: A | Discharge status: Home / Self Care | Payer: Medicaid Other | Attending: Emergency Medicine | Admitting: Emergency Medicine

## 2019-05-08 DIAGNOSIS — Z79899 Other long term (current) drug therapy: Secondary | ICD-10-CM | POA: Diagnosis not present

## 2019-05-08 DIAGNOSIS — D573 Sickle-cell trait: Secondary | ICD-10-CM | POA: Diagnosis not present

## 2019-05-08 DIAGNOSIS — Z87891 Personal history of nicotine dependence: Secondary | ICD-10-CM | POA: Diagnosis not present

## 2019-05-08 DIAGNOSIS — R935 Abnormal findings on diagnostic imaging of other abdominal regions, including retroperitoneum: Secondary | ICD-10-CM

## 2019-05-08 DIAGNOSIS — R109 Unspecified abdominal pain: Secondary | ICD-10-CM | POA: Diagnosis present

## 2019-05-08 DIAGNOSIS — R112 Nausea with vomiting, unspecified: Secondary | ICD-10-CM | POA: Insufficient documentation

## 2019-05-08 LAB — COMPREHENSIVE METABOLIC PANEL
ALT: 27 U/L (ref 0–44)
AST: 36 U/L (ref 15–41)
Albumin: 4.9 g/dL (ref 3.5–5.0)
Alkaline Phosphatase: 50 U/L (ref 38–126)
Anion gap: 17 — ABNORMAL HIGH (ref 5–15)
BUN: 14 mg/dL (ref 6–20)
CO2: 14 mmol/L — ABNORMAL LOW (ref 22–32)
Calcium: 9.4 mg/dL (ref 8.9–10.3)
Chloride: 108 mmol/L (ref 98–111)
Creatinine, Ser: 0.53 mg/dL (ref 0.44–1.00)
GFR calc Af Amer: >60 mL/min (ref >60)
GFR calc non Af Amer: >60 mL/min (ref >60)
Glucose, Bld: 134 mg/dL — ABNORMAL HIGH (ref 70–99)
Potassium: 4 mmol/L (ref 3.5–5.1)
Sodium: 139 mmol/L (ref 135–145)
Total Bilirubin: 0.7 mg/dL (ref 0.3–1.2)
Total Protein: 8.2 g/dL — ABNORMAL HIGH (ref 6.5–8.1)

## 2019-05-08 LAB — CBC
HCT: 41.1 % (ref 36.0–46.0)
Hemoglobin: 14.7 g/dL (ref 12.0–15.0)
MCH: 32.2 pg (ref 26.0–34.0)
MCHC: 35.8 g/dL (ref 30.0–36.0)
MCV: 89.9 fL (ref 80.0–100.0)
Platelets: 306 10*3/uL (ref 150–400)
RBC: 4.57 MIL/uL (ref 3.87–5.11)
RDW: 13 % (ref 11.5–15.5)
WBC: 19.8 10*3/uL — ABNORMAL HIGH (ref 4.0–10.5)
nRBC: 0 % (ref 0.0–0.2)

## 2019-05-08 LAB — I-STAT BETA HCG BLOOD, ED (MC, WL, AP ONLY): I-stat hCG, quantitative: 9.3 m[IU]/mL — ABNORMAL HIGH (ref <5)

## 2019-05-08 LAB — HCG, QUANTITATIVE, PREGNANCY: hCG, Beta Chain, Quant, S: 14 m[IU]/mL — ABNORMAL HIGH (ref <5)

## 2019-05-08 LAB — LIPASE, BLOOD: Lipase: 16 U/L (ref 11–51)

## 2019-05-08 MED ORDER — SODIUM CHLORIDE (PF) 0.9 % IJ SOLN
INTRAMUSCULAR | Status: AC
  Filled 2019-05-08: qty 50

## 2019-05-08 MED ORDER — IOHEXOL 300 MG/ML  SOLN
100.0000 mL | Freq: Once | INTRAMUSCULAR | Status: AC | PRN
  Administered 2019-05-08: 100 mL via INTRAVENOUS

## 2019-05-08 MED ORDER — IOHEXOL 9 MG/ML PO SOLN
500.0000 mL | ORAL | Status: AC
  Administered 2019-05-08 (×2): 500 mL via ORAL

## 2019-05-08 MED ORDER — SODIUM CHLORIDE 0.9% FLUSH
3.0000 mL | Freq: Once | INTRAVENOUS | Status: AC
  Administered 2019-05-08: 3 mL via INTRAVENOUS

## 2019-05-08 MED ORDER — IOHEXOL 9 MG/ML PO SOLN
ORAL | Status: AC
  Filled 2019-05-08: qty 1000

## 2019-05-08 MED ORDER — SODIUM CHLORIDE 0.9 % IV BOLUS
1000.0000 mL | Freq: Once | INTRAVENOUS | Status: AC
  Administered 2019-05-08: 1000 mL via INTRAVENOUS

## 2019-05-08 MED ORDER — IOHEXOL 9 MG/ML PO SOLN: 500.0000 mL | ORAL | Status: DC

## 2019-05-08 MED ORDER — MORPHINE SULFATE (PF) 4 MG/ML IV SOLN
4.0000 mg | Freq: Once | INTRAVENOUS | Status: AC
  Administered 2019-05-08: 4 mg via INTRAVENOUS
  Filled 2019-05-08: qty 1

## 2019-05-08 MED ORDER — HALOPERIDOL LACTATE 5 MG/ML IJ SOLN
5.0000 mg | Freq: Once | INTRAMUSCULAR | Status: AC
  Administered 2019-05-08: 5 mg via INTRAVENOUS
  Filled 2019-05-08: qty 1

## 2019-05-08 MED ORDER — KETOROLAC TROMETHAMINE 30 MG/ML IJ SOLN
30.0000 mg | Freq: Once | INTRAMUSCULAR | Status: AC
  Administered 2019-05-08: 30 mg via INTRAVENOUS
  Filled 2019-05-08: qty 1

## 2019-05-08 MED ORDER — ONDANSETRON HCL 4 MG/2ML IJ SOLN
4.0000 mg | Freq: Once | INTRAMUSCULAR | Status: AC
  Administered 2019-05-08: 4 mg via INTRAVENOUS
  Filled 2019-05-08: qty 2

## 2019-05-08 NOTE — ED Provider Notes (Signed)
[facility] DEPT Provider Note   CSN: [id number] Arrival date & time: 05/08/19  0730     History   Chief Complaint Chief Complaint  Patient presents with  . Abdominal Pain    HPI Veronica Green is a 38 y.o. female.     The history is provided by the patient and medical records. No language interpreter was used.  Abdominal Pain    38 year old female with history of anxiety, sickle cell trait, recent molar pregnancy status post D&C on 04/17/2019 presenting for evaluation abdominal pain.  Patient states she was at her friend's house last night eating chicken and drinking small amount of alcohol.  She woke up this morning with diffuse abdominal discomfort follows with persistent nausea, vomited multiple episodes of nonbloody nonbilious contents and having normal bowel movement.  She reports feeling dehydrated, dry heaving, and uncomfortable.  She does not complain of any fever chills no cough shortness of breath dysuria hematuria vaginal bleeding or vaginal discharge.  She had a bowel movement early this morning it was normal.  She admits to marijuana use several days prior.  She believes she may have had food poisoning.  She report her D&C procedure was uneventful and she did not have any complication afterward.  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 Anxiety   . Ascites   . Sickle cell trait ([place])   . Umbilical hernia     Patient Active Problem List   Diagnosis Date Noted  . 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 Abnormal pregnancy 04/16/2019  . Unwanted fertility 08/11/2016  . Supervision of high risk pregnancy, antepartum 08/10/2016    Past Surgical History:  Procedure Laterality Date  . DILATION AND CURETTAGE OF UTERUS    . DILATION AND EVACUATION N/A 04/17/2019   Procedure: SUCTION DILATATION AND EVACUATION;  Surgeon: Aletha Halim, MD;  Location: [facility];  Service: Gynecology;  Laterality: N/A;  . OPERATIVE ULTRASOUND  N/A 04/17/2019   Procedure: OPERATIVE ULTRASOUND;  Surgeon: Aletha Halim, MD;  Location: [facility];  Service: Gynecology;  Laterality: N/A;  . RIGHT OOPHORECTOMY  07/2016   diagnostic laparoscopy converted to ex lap w/ umbilical hernia repair @ [facility] (benign)  . UMBILICAL HERNIA REPAIR  07/2016   [facility]     OB History    Gravida  8   Para  5   Term  5   Preterm  0   AB  2   Living  5     SAB  0   TAB  1   Ectopic  0   Multiple  0   Live Births  5            Home Medications    Prior to Admission medications   Medication Sig Start Date End Date Taking? Authorizing Provider  docusate sodium (COLACE) 100 MG capsule Take 1 capsule (100 mg total) by mouth 2 (two) times daily as needed. 04/17/19  Yes Aletha Halim, MD  ibuprofen (ADVIL) 600 MG tablet Take 1 tablet (600 mg total) by mouth every 6 (six) hours as needed. Patient taking differently: Take 600 mg by mouth every 6 (six) hours as needed for mild pain.  04/17/19  Yes Aletha Halim, MD  oxyCODONE-acetaminophen (PERCOCET/ROXICET) 5-325 MG tablet Take 1 tablet by mouth every 6 (six) hours as needed. 04/17/19  Yes Aletha Halim, MD  pyridOXINE (VITAMIN B-6) 25 MG tablet Take 1 tablet (25 mg total) by mouth  every 8 (eight) hours. Patient not taking: Reported on 05/08/2019 09/27/18   Darlina Rumpf, CNM    Family History Family History  Problem Relation Age of Onset  . Cancer Maternal Grandmother   . Cancer Paternal Grandmother   . Asthma Mother   . Multiple sclerosis Sister   . Anesthesia problems Neg Hx   . Hypotension Neg Hx   . Malignant hyperthermia Neg Hx   . Pseudochol deficiency Neg Hx     Social History Social History   Tobacco Use  . Smoking status: Former Smoker    Packs/day: 0.50    Types: Cigarettes    Quit date: 11/21/2015    Years since quitting: 3.4  . Smokeless tobacco: Never Used  . Tobacco comment: unable to smoke last 2 wks  Substance Use Topics  . Alcohol use: No     Comment: occ  . Drug use: Yes    Types: Marijuana    Comment: stopped June 2020     Allergies   Patient has no known allergies.   Review of Systems Review of Systems  Gastrointestinal: Positive for abdominal pain.  All other systems reviewed and are negative.    Physical Exam Updated Vital Signs BP 136/90   Pulse (!) 56   Temp 98.6 F (37 C) (Oral)   Resp 14   Wt 54.4 kg   LMP 04/09/2019   SpO2 100%   Breastfeeding Unknown   BMI 21.26 kg/m   Physical Exam Vitals signs and nursing note reviewed.  Constitutional:      Appearance: She is well-developed.     Comments: Patient appears uncomfortable, rocking around in bed.  HENT:     Head: Atraumatic.  Eyes:     Conjunctiva/sclera: Conjunctivae normal.  Neck:     Musculoskeletal: Neck supple.  Cardiovascular:     Rate and Rhythm: Normal rate and regular rhythm.     Heart sounds: Normal heart sounds.  Pulmonary:     Effort: Pulmonary effort is normal.     Breath sounds: Normal breath sounds.  Abdominal:     General: Abdomen is flat.     Palpations: Abdomen is soft.     Tenderness: There is abdominal tenderness (Diffuse abdominal tenderness without guarding or rebound tenderness.  Negative Murphy sign, no pain at McBurney's point).  Skin:    Findings: No rash.  Neurological:     Mental Status: She is alert.      ED Treatments / Results  Labs (all labs ordered are listed, but only abnormal results are displayed) Labs Reviewed  COMPREHENSIVE METABOLIC PANEL - Abnormal; Notable for the following components:      Result Value   CO2 14 (*)    Glucose, Bld 134 (*)    Total Protein 8.2 (*)    Anion gap 17 (*)    All other components within normal limits  CBC - Abnormal; Notable for the following components:   WBC 19.8 (*)    All other components within normal limits  I-STAT BETA HCG BLOOD, ED ([facility], [facility], [facility] ONLY) - Abnormal; Notable for the following components:   I-stat hCG, quantitative 9.3 (*)    All  other components within normal limits  LIPASE, BLOOD  URINALYSIS, ROUTINE W REFLEX MICROSCOPIC  HCG, QUANTITATIVE, PREGNANCY    EKG None  ED ECG REPORT   Date: 05/08/2019  Rate: 67  Rhythm: normal sinus rhythm  QRS Axis: normal  Intervals: normal  ST/T Wave abnormalities: normal  Conduction Disutrbances:none  Narrative Interpretation:   Old EKG Reviewed: unchanged  I have personally reviewed the EKG tracing and agree with the computerized printout as noted.   Radiology Ct Abdomen Pelvis W Contrast  Result Date: 05/08/2019 CLINICAL DATA:  Generalized abdominal pain, nausea and vomiting. Status post D&E 04/17/2019 for molar pregnancy with downtrending quantitative beta hCG. EXAM: CT ABDOMEN AND PELVIS WITH CONTRAST TECHNIQUE: Multidetector CT imaging of the abdomen and pelvis was performed using the standard protocol following bolus administration of intravenous contrast. CONTRAST:  137mL OMNIPAQUE IOHEXOL 300 MG/ML  SOLN COMPARISON:  09/30/2013 CT abdomen/pelvis. FINDINGS: Lower chest: No significant pulmonary nodules or acute consolidative airspace disease. Hepatobiliary: Top-normal liver size. Four scattered small hypodense liver lesions, largest 1.2 cm in the posterior right liver (series 3/image 24), not definitely visualized on the prior CT. Normal gallbladder with no radiopaque cholelithiasis. No biliary ductal dilatation. Pancreas: Normal, with no mass or duct dilation. Spleen: Normal size. No mass. Adrenals/Urinary Tract: Normal adrenals. Normal kidneys with no hydronephrosis and no renal mass. Normal bladder. Stomach/Bowel: Normal non-distended stomach. Normal caliber small bowel with no small bowel wall thickening. Normal appendix. Oral contrast transits to distal small bowel. Centrally collapsed large bowel with no definite large bowel wall thickening, diverticulosis or significant pericolonic fat stranding. Vascular/Lymphatic: Normal caliber abdominal aorta. Patent portal,  splenic, hepatic and renal veins. No pathologically enlarged lymph nodes in the abdomen or pelvis. Reproductive: Grossly normal uterus with no appreciable gas in the uterine cavity. No adnexal mass. Other: No pneumoperitoneum, ascites or focal fluid collection. Musculoskeletal: No aggressive appearing focal osseous lesions. IMPRESSION: 1. No acute abnormality. No evidence of bowel obstruction or acute bowel inflammation. 2. Four scattered small low-attenuation liver lesions, largest 1.2 cm in the posterior right liver, not definitely seen on 2015 comparison CT abdomen study. Short-term outpatient MRI abdomen follow-up without and with IV contrast recommended for further characterization. This recommendation follows ACR consensus guidelines: Managing Incidental Findings on Abdominal CT: White Paper of the ACR Incidental Findings Committee. J Am Coll Radiol 2010;7:754-773. Electronically Signed   By: Ilona Sorrel M.D.   On: 05/08/2019 13:37    Procedures Procedures (including critical care time)  Medications Ordered in ED Medications  iohexol (OMNIPAQUE) 9 MG/ML oral solution (has no administration in time range)  sodium chloride (PF) 0.9 % injection (has no administration in time range)  sodium chloride flush (NS) 0.9 % injection 3 mL (3 mLs Intravenous Given 05/08/19 1404)  morphine 4 MG/ML injection 4 mg (4 mg Intravenous Given 05/08/19 0930)  ondansetron (ZOFRAN) injection 4 mg (4 mg Intravenous Given 05/08/19 0930)  sodium chloride 0.9 % bolus 1,000 mL (0 mLs Intravenous Stopped 05/08/19 1140)  iohexol (OMNIPAQUE) 9 MG/ML oral solution 500 mL (500 mLs Oral Contrast Given 05/08/19 1045)  iohexol (OMNIPAQUE) 300 MG/ML solution 100 mL (100 mLs Intravenous Contrast Given 05/08/19 1246)  haloperidol lactate (HALDOL) injection 5 mg (5 mg Intravenous Given 05/08/19 1404)  ketorolac (TORADOL) 30 MG/ML injection 30 mg (30 mg Intravenous Given 05/08/19 1404)     Initial Impression / Assessment and Plan  / ED Course  I have reviewed the triage vital signs and the nursing notes.  Pertinent labs & imaging results that were available during my care of the patient were reviewed by me and considered in my medical decision making (see chart for details).        BP 138/83   Pulse 66   Temp 98.6 F (37 C) (Oral)   Resp (!) 23   Wt  54.4 kg   LMP 04/09/2019   SpO2 100%   Breastfeeding Unknown   BMI 21.26 kg/m    Final Clinical Impressions(s) / ED Diagnoses   Final diagnoses:  Non-intractable vomiting with nausea, unspecified vomiting type  Abnormal abdominal CT scan    ED Discharge Orders    None     9:29 AM Patient here with abdominal pain and associate nausea vomiting.  Recently had a molar pregnancy status post D&C without any complication.  She appears uncomfortable, and does admits to recent marijuana use.  I suspect her symptoms may be due to cannabinoid hyperemesis.  Due to leukocytosis of 19.8, will obtain abdominal pelvic CT scan to rule out acute abdominal pathology, however I have low suspicion for appendicitis, or complication from recent D&C.  IV fluid given.  Mildly elevated i-STAT of 9.3, likely residual from previous pregnancy.  1:44 PM Abdominal pelvis CT scan showed no acute abnormalities.  No evidence of obstructions or evidence of bowel inflammation.  There are couple of low-attenuation liver lesions noted.  Recommend outpatient MRI without and with contrast for further evaluation.  This is an incidental finding.  I did discuss this with patient and recommend outpatient evaluation.  Haldol given for sxs control.  EKG with QTc of 457, borderline.    2:46 PM Pt currently sleeping and resting comfortably.    3:26 PM Pt felt better, stable for discharge.  Family member is coming to pick her up.  Pt understand not to drive or operate heavy machinery due to potential drowsiness from medication.  outpt f/u on incidental finding of liver lesions.  Return precaution  given. Recommend avoid marijuana use as it may precipitate her condition.      Domenic Moras, PA-C 05/08/19 1530    Veryl Speak, MD 05/09/19 [phone]

## 2019-05-08 NOTE — Discharge Instructions (Addendum)
You abdominal discomfort along with nausea and vomiting may be due to marijuana use side effect.  Please avoid marijuana use.  Your CT scan today did show several lesions in your liver.  This is an incidental finding, however please follow up with your doctor and request an abdominal MRI for further evaluation.  Return if you have any concerns.

## 2019-05-08 NOTE — ED Triage Notes (Signed)
Per EMS pt complaint of generalized abdominal pain with n/v onset 4 hours ago; 4 mg of zofran IV given en route with EMS.

## 2019-05-09 ENCOUNTER — Emergency Department (HOSPITAL_COMMUNITY)
Admission: EM | Admit: 2019-05-09 | Discharge: 2019-05-10 | Disposition: A | Discharge status: Home / Self Care | Payer: Medicaid Other | Attending: Emergency Medicine | Admitting: Emergency Medicine

## 2019-05-09 ENCOUNTER — Other Ambulatory Visit: Payer: Self-pay

## 2019-05-09 ENCOUNTER — Encounter (HOSPITAL_COMMUNITY): Payer: Self-pay | Admitting: Emergency Medicine

## 2019-05-09 DIAGNOSIS — R197 Diarrhea, unspecified: Secondary | ICD-10-CM | POA: Insufficient documentation

## 2019-05-09 DIAGNOSIS — R1084 Generalized abdominal pain: Secondary | ICD-10-CM | POA: Diagnosis not present

## 2019-05-09 DIAGNOSIS — Z87891 Personal history of nicotine dependence: Secondary | ICD-10-CM | POA: Diagnosis not present

## 2019-05-09 DIAGNOSIS — E876 Hypokalemia: Secondary | ICD-10-CM | POA: Diagnosis not present

## 2019-05-09 DIAGNOSIS — R112 Nausea with vomiting, unspecified: Secondary | ICD-10-CM | POA: Insufficient documentation

## 2019-05-09 LAB — URINALYSIS, ROUTINE W REFLEX MICROSCOPIC
Bacteria, UA: NONE SEEN
Bilirubin Urine: NEGATIVE
Glucose, UA: 50 mg/dL — AB
Ketones, ur: 80 mg/dL — AB
Nitrite: NEGATIVE
Protein, ur: 100 mg/dL — AB
Specific Gravity, Urine: 1.033 — ABNORMAL HIGH (ref 1.005–1.030)
pH: 5 (ref 5.0–8.0)

## 2019-05-09 LAB — CBC
HCT: 39.6 % (ref 36.0–46.0)
Hemoglobin: 14 g/dL (ref 12.0–15.0)
MCH: 31.1 pg (ref 26.0–34.0)
MCHC: 35.4 g/dL (ref 30.0–36.0)
MCV: 88 fL (ref 80.0–100.0)
Platelets: 292 10*3/uL (ref 150–400)
RBC: 4.5 MIL/uL (ref 3.87–5.11)
RDW: 12.5 % (ref 11.5–15.5)
WBC: 11.2 10*3/uL — ABNORMAL HIGH (ref 4.0–10.5)
nRBC: 0 % (ref 0.0–0.2)

## 2019-05-09 LAB — RAPID URINE DRUG SCREEN, HOSP PERFORMED
Amphetamines: NOT DETECTED
Barbiturates: NOT DETECTED
Benzodiazepines: NOT DETECTED
Cocaine: NOT DETECTED
Opiates: NOT DETECTED
Tetrahydrocannabinol: POSITIVE — AB

## 2019-05-09 LAB — COMPREHENSIVE METABOLIC PANEL
ALT: 27 U/L (ref 0–44)
AST: 31 U/L (ref 15–41)
Albumin: 4.6 g/dL (ref 3.5–5.0)
Alkaline Phosphatase: 43 U/L (ref 38–126)
Anion gap: 13 (ref 5–15)
BUN: 9 mg/dL (ref 6–20)
CO2: 24 mmol/L (ref 22–32)
Calcium: 8.9 mg/dL (ref 8.9–10.3)
Chloride: 103 mmol/L (ref 98–111)
Creatinine, Ser: 0.62 mg/dL (ref 0.44–1.00)
GFR calc Af Amer: >60 mL/min (ref >60)
GFR calc non Af Amer: >60 mL/min (ref >60)
Glucose, Bld: 142 mg/dL — ABNORMAL HIGH (ref 70–99)
Potassium: 3.1 mmol/L — ABNORMAL LOW (ref 3.5–5.1)
Sodium: 140 mmol/L (ref 135–145)
Total Bilirubin: 0.9 mg/dL (ref 0.3–1.2)
Total Protein: 7.7 g/dL (ref 6.5–8.1)

## 2019-05-09 LAB — LIPASE, BLOOD: Lipase: 20 U/L (ref 11–51)

## 2019-05-09 LAB — POC OCCULT BLOOD, ED: Fecal Occult Bld: POSITIVE — AB

## 2019-05-09 LAB — I-STAT BETA HCG BLOOD, ED (MC, WL, AP ONLY): I-stat hCG, quantitative: <5 m[IU]/mL (ref <5)

## 2019-05-09 MED ORDER — SODIUM CHLORIDE 0.9 % IV BOLUS
1000.0000 mL | Freq: Once | INTRAVENOUS | Status: AC
  Administered 2019-05-09: 1000 mL via INTRAVENOUS

## 2019-05-09 MED ORDER — MORPHINE SULFATE (PF) 4 MG/ML IV SOLN
4.0000 mg | Freq: Once | INTRAVENOUS | Status: AC
  Administered 2019-05-09: 4 mg via INTRAVENOUS
  Filled 2019-05-09: qty 1

## 2019-05-09 MED ORDER — ONDANSETRON HCL 4 MG/2ML IJ SOLN
4.0000 mg | Freq: Once | INTRAMUSCULAR | Status: AC
  Administered 2019-05-09: 4 mg via INTRAVENOUS
  Filled 2019-05-09: qty 2

## 2019-05-09 MED ORDER — POTASSIUM CHLORIDE 10 MEQ/100ML IV SOLN
10.0000 meq | Freq: Once | INTRAVENOUS | Status: AC
  Administered 2019-05-09: 10 meq via INTRAVENOUS
  Filled 2019-05-09: qty 100

## 2019-05-09 MED ORDER — SODIUM CHLORIDE 0.9% FLUSH
3.0000 mL | Freq: Once | INTRAVENOUS | Status: AC
  Administered 2019-05-09: 3 mL via INTRAVENOUS

## 2019-05-09 NOTE — ED Provider Notes (Signed)
[facility] DEPT Provider Note   CSN: [id number] Arrival date & time: 05/09/19  1906     History   Chief Complaint Chief Complaint  Patient presents with   Abdominal Pain   Nausea   Emesis    HPI Veronica Green is a 38 y.o. female with PMHx anxiety, sickle cell trait, and recent D&C for molar pregnancy (04/17/2019) who presents to the ED today complaining of continued, diffuse, sharp, 10/10 abdominal pain that began yesterday morning around 6 AM. Pt also complains of nausea and nonbloody nonbilious emesis.  Chart reviewed -  patient was seen in the ED yesterday for same.  Workup included leukocytosis of 19,800.  CT abdomen pelvis without acute findings.  She did have some low-attenuation lesions with recommendation for outpatient MRI with and without contrast for further evaluation.  Patient's pain and emesis was controlled in the ED after morphine, Zofran, Haldol.  She was discharged home with close PCP follow-up.  There was concern that marijuana use was contributing to patient's symptoms.  Patient states that she returns today with continued abdominal pain, nausea, vomiting.  States that she has been unable to keep anything down at home.  States she has not taken anything for her symptoms as she was not discharged with any medications.  She states that the last time she used any marijuana was greater than 3 weeks ago.  She does report that her stool did appear dark and tarry today.  She denies any Pepto-Bismol use recently.  Denies fever, chills, chest pain, shortness of breath, urinary symptoms, vaginal discharge, pelvic pain, flank pain, any other associated symptoms.      The history is provided by the patient and medical records.    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   Abnormal  pregnancy 04/16/2019   Unwanted fertility 08/11/2016   Supervision of high risk pregnancy, antepartum 08/10/2016    Past Surgical History:  Procedure Laterality Date   DILATION AND CURETTAGE OF UTERUS     DILATION AND EVACUATION N/A 04/17/2019   Procedure: SUCTION DILATATION AND EVACUATION;  Surgeon: Aletha Halim, MD;  Location: [place] [facility];  Service: Gynecology;  Laterality: N/A;   OPERATIVE ULTRASOUND N/A 04/17/2019   Procedure: OPERATIVE ULTRASOUND;  Surgeon: Aletha Halim, MD;  Location: [place];  Service: Gynecology;  Laterality: N/A;   RIGHT OOPHORECTOMY  07/2016   diagnostic laparoscopy converted to ex lap w/ umbilical hernia repair @ [facility] (benign)   UMBILICAL HERNIA REPAIR  07/2016   [facility]     OB History    Gravida  8   Para  5   Term  5   Preterm  0   AB  2   Living  5     SAB  0   TAB  1   Ectopic  0   Multiple  0   Live Births  5            Home Medications    Prior to Admission medications   Medication Sig Start Date End Date Taking? Authorizing Provider  docusate sodium (COLACE) 100 MG capsule Take 1 capsule (100 mg total) by mouth 2 (two) times daily as needed. 04/17/19  Yes Aletha Halim, MD  ibuprofen (ADVIL) 600 MG tablet Take 1 tablet (600 mg  total) by mouth every 6 (six) hours as needed. Patient taking differently: Take 600 mg by mouth every 6 (six) hours as needed for mild pain.  04/17/19  Yes Aletha Halim, MD  oxyCODONE-acetaminophen (PERCOCET/ROXICET) 5-325 MG tablet Take 1 tablet by mouth every 6 (six) hours as needed. 04/17/19  Yes Aletha Halim, MD  dicyclomine (BENTYL) 20 MG tablet Take 1 tablet (20 mg total) by mouth 2 (two) times daily. 05/10/19   Guerry Covington, PA-C  ondansetron (ZOFRAN ODT) 4 MG disintegrating tablet Take 1 tablet (4 mg total) by mouth every 8 (eight) hours as needed for nausea or vomiting. 05/10/19   Alroy Bailiff, Jameon Deller, PA-C  potassium chloride (KLOR-CON) 10 MEQ tablet Take 1 tablet (10 mEq total) by mouth  daily for 5 days. 05/10/19 05/15/19  Eustaquio Maize, PA-C  pyridOXINE (VITAMIN B-6) 25 MG tablet Take 1 tablet (25 mg total) by mouth every 8 (eight) hours. Patient not taking: Reported on 05/08/2019 09/27/18   Darlina Rumpf, CNM    Family History Family History  Problem Relation Age of Onset   Cancer Maternal Grandmother    Cancer Paternal Grandmother    Asthma Mother    Multiple sclerosis Sister    Anesthesia problems Neg Hx    Hypotension Neg Hx    Malignant hyperthermia Neg Hx    Pseudochol deficiency Neg Hx     Social History Social History   Tobacco Use   Smoking status: Former Smoker    Packs/day: 0.50    Types: Cigarettes    Quit date: 11/21/2015    Years since quitting: 3.4   Smokeless tobacco: Never Used   Tobacco comment: unable to smoke last 2 wks  Substance Use Topics   Alcohol use: No    Comment: occ   Drug use: Yes    Types: Marijuana    Comment: stopped June 2020     Allergies   Patient has no known allergies.   Review of Systems Review of Systems  Constitutional: Negative for chills and fever.  HENT: Negative for congestion.   Eyes: Negative for visual disturbance.  Respiratory: Negative for cough and shortness of breath.   Cardiovascular: Negative for chest pain.  Gastrointestinal: Positive for abdominal pain, blood in stool, diarrhea, nausea and vomiting.  Genitourinary: Negative for difficulty urinating and flank pain.  Musculoskeletal: Negative for myalgias.  Skin: Negative for rash.  Neurological: Negative for headaches.     Physical Exam Updated Vital Signs BP (!) 135/99 (BP Location: Right Arm)    Pulse 89    Temp 98.3 F (36.8 C) (Oral)    Resp 18    LMP 04/09/2019    SpO2 96%   Physical Exam Vitals signs and nursing note reviewed.  Constitutional:      Appearance: She is not ill-appearing.     Comments: Upon entering room patient began writhing around in bed clutching her abdomen  HENT:     Head:  Normocephalic and atraumatic.  Eyes:     Conjunctiva/sclera: Conjunctivae normal.  Neck:     Musculoskeletal: Neck supple.  Cardiovascular:     Rate and Rhythm: Normal rate and regular rhythm.  Pulmonary:     Effort: Pulmonary effort is normal.     Breath sounds: Normal breath sounds.  Abdominal:     Palpations: Abdomen is soft.     Tenderness: There is generalized abdominal tenderness. There is no right CVA tenderness, left CVA tenderness, guarding or rebound.     Comments: Soft, mild tenderness throughout  abdomen although patient easily distractible and talking through the exam, +BS throughout, no r/g/r, neg murphy's, neg mcburney's, no CVA TTP  Genitourinary:    Comments: Chaperone Sundra Aland, RN present for rectal exam. Good rectal tone. 1 small external hemorrhoid appreciated; nonthrombosed. Stool light [place]; no obvious blood or melena on gloved finger. Guaiac positive Skin:    General: Skin is warm and dry.  Neurological:     Mental Status: She is alert.      ED Treatments / Results  Labs (all labs ordered are listed, but only abnormal results are displayed) Labs Reviewed  COMPREHENSIVE METABOLIC PANEL - Abnormal; Notable for the following components:      Result Value   Potassium 3.1 (*)    Glucose, Bld 142 (*)    All other components within normal limits  CBC - Abnormal; Notable for the following components:   WBC 11.2 (*)    All other components within normal limits  URINALYSIS, ROUTINE W REFLEX MICROSCOPIC - Abnormal; Notable for the following components:   APPearance CLOUDY (*)    Specific Gravity, Urine 1.033 (*)    Glucose, UA 50 (*)    Hgb urine dipstick MODERATE (*)    Ketones, ur 80 (*)    Protein, ur 100 (*)    Leukocytes,Ua TRACE (*)    All other components within normal limits  RAPID URINE DRUG SCREEN, HOSP PERFORMED - Abnormal; Notable for the following components:   Tetrahydrocannabinol POSITIVE (*)    All other components within normal limits   POC OCCULT BLOOD, ED - Abnormal; Notable for the following components:   Fecal Occult Bld POSITIVE (*)    All other components within normal limits  URINE CULTURE  LIPASE, BLOOD  I-STAT BETA HCG BLOOD, ED ([facility], [facility], [facility] ONLY)    EKG None  Radiology Ct Abdomen Pelvis W Contrast  Result Date: 05/08/2019 CLINICAL DATA:  Generalized abdominal pain, nausea and vomiting. Status post D&E 04/17/2019 for molar pregnancy with downtrending quantitative beta hCG. EXAM: CT ABDOMEN AND PELVIS WITH CONTRAST TECHNIQUE: Multidetector CT imaging of the abdomen and pelvis was performed using the standard protocol following bolus administration of intravenous contrast. CONTRAST:  162mL OMNIPAQUE IOHEXOL 300 MG/ML  SOLN COMPARISON:  09/30/2013 CT abdomen/pelvis. FINDINGS: Lower chest: No significant pulmonary nodules or acute consolidative airspace disease. Hepatobiliary: Top-normal liver size. Four scattered small hypodense liver lesions, largest 1.2 cm in the posterior right liver (series 3/image 24), not definitely visualized on the prior CT. Normal gallbladder with no radiopaque cholelithiasis. No biliary ductal dilatation. Pancreas: Normal, with no mass or duct dilation. Spleen: Normal size. No mass. Adrenals/Urinary Tract: Normal adrenals. Normal kidneys with no hydronephrosis and no renal mass. Normal bladder. Stomach/Bowel: Normal non-distended stomach. Normal caliber small bowel with no small bowel wall thickening. Normal appendix. Oral contrast transits to distal small bowel. Centrally collapsed large bowel with no definite large bowel wall thickening, diverticulosis or significant pericolonic fat stranding. Vascular/Lymphatic: Normal caliber abdominal aorta. Patent portal, splenic, hepatic and renal veins. No pathologically enlarged lymph nodes in the abdomen or pelvis. Reproductive: Grossly normal uterus with no appreciable gas in the uterine cavity. No adnexal mass. Other: No pneumoperitoneum, ascites or focal  fluid collection. Musculoskeletal: No aggressive appearing focal osseous lesions. IMPRESSION: 1. No acute abnormality. No evidence of bowel obstruction or acute bowel inflammation. 2. Four scattered small low-attenuation liver lesions, largest 1.2 cm in the posterior right liver, not definitely seen on 2015 comparison CT abdomen study. Short-term outpatient MRI abdomen  follow-up without and with IV contrast recommended for further characterization. This recommendation follows ACR consensus guidelines: Managing Incidental Findings on Abdominal CT: White Paper of the ACR Incidental Findings Committee. J Am Coll Radiol 2010;7:754-773. Electronically Signed   By: Ilona Sorrel M.D.   On: 05/08/2019 13:37    Procedures Procedures (including critical care time)  Medications Ordered in ED Medications  sodium chloride flush (NS) 0.9 % injection 3 mL (3 mLs Intravenous Given 05/09/19 2218)  sodium chloride 0.9 % bolus 1,000 mL (1,000 mLs Intravenous New Bag/Given 05/09/19 2220)  morphine 4 MG/ML injection 4 mg (4 mg Intravenous Given 05/09/19 2215)  ondansetron (ZOFRAN) injection 4 mg (4 mg Intravenous Given 05/09/19 2217)  potassium chloride 10 mEq in 100 mL IVPB (0 mEq Intravenous Stopped 05/09/19 2321)     Initial Impression / Assessment and Plan / ED Course  I have reviewed the triage vital signs and the nursing notes.  Pertinent labs & imaging results that were available during my care of the patient were reviewed by me and considered in my medical decision making (see chart for details).  Clinical Course as of May 10 3  Tue May 09, 2019  2326 Tetrahydrocannabinol(!): POSITIVE [MV]    Clinical Course User Index [MV] Eustaquio Maize, [place]   38 year old female presents the ED today with continued abdominal pain, nausea, vomiting.  Seen yesterday for same.  Work-up at that time with leukocytosis 19,000 but unequivocal CT scan.  Does show some low-attenuation lesions with recommended MRI outpatient  follow-up.  There was concern for cannabis hyperemesis syndrome; patient states she has not smoked marijuana in greater than 3 weeks.  Will obtain UDS at this time.  Given she had CT scan yesterday without acute findings do not feel she needs additional CT scan at this time.  She has diffuse abdominal tenderness although easily distractible during exam and speaking through palpation.  Vital signs are stable today.  Patient afebrile without tachycardia or tachypnea.   Lab work obtained prior to being seen.  Leukocytosis 11,000, improved from yesterday.  CMP with potassium 3.1, will replete with 10 mEq.  No other electrolyte abnormalities.  Creatinine within normal limits.  No LFT elevation.  Beta hCG negative today.  It was mildly elevated at 9.3 yesterday but patient did recently have a D&C and likely residual from this.  Lipase negative.  Awaiting urinalysis at this time.  Will add UDS to this.  Patient initially complained of dark tarry stools.  Rectal exam with external hemorrhoid but no obvious blood or melena on gloved finger.  Guaiac +. Hgb stable at 14.0. No inflammation or colitis seen on CT scan yesterday; pt denies any worsening pain today just was unable to tolerate it at home so she returned to the ED. I have low suspicion for infectious cause and would suspect it to show on CT scan yesterday. Will have her follow with GI regarding this.   Will control patient's symptoms with pain medication, Zofran for nausea and fluids.  Will reevaluate.   U/A with trace leuks, 6-10 WBC per HPF, 21-50 squamous epithelial - suspect contamination. Pt does have 21-50 RBCs as well as hgb on urine dipstick; she is denying any vaginal bleeding or hematuria at this time. Pt does report vaginal bleeding at the beginning of November but is unsure if it was a real menstrual cycle or bleeding due to molar pregnancy with recent D&C (04/17/2019). Question if blood is related to recent start of menstrual cycle. Pt without  any  CVA ttp; low suspicion for kidney stones. UDS positive for THC.   Upon reevaluation patient resting comfortably in darkened room. When awakened she reports the pain is controlled "for now." Will fluid challenge her prior to discharge.   Pt able to tolerate fluids without difficulty. States she feels comfortable going home. Have offered sending medication to 24 hour pharmacy but pt states she will go home and sleep and pick it up in the morning. Pt advised to take potassium supplements as prescribed and have potassium level checked in 1-2 weeks. Have given zofran and bentyl for symptomatic relief. Referral given to GI as well for follow up of hemoccult positive stool. Strict return precautions discussed with patient. She had ample time to ask questions and all questions were answered. She is in agreement with plan at this time and stable for discharge home.   This note was prepared using Dragon voice recognition software and may include unintentional dictation errors due to the inherent limitations of voice recognition software.      Final Clinical Impressions(s) / ED Diagnoses   Final diagnoses:  Generalized abdominal pain  Nausea vomiting and diarrhea  Hypokalemia    ED Discharge Orders         Ordered    potassium chloride (KLOR-CON) 10 MEQ tablet  Daily     05/10/19 0003    ondansetron (ZOFRAN ODT) 4 MG disintegrating tablet  Every 8 hours PRN     05/10/19 0003    dicyclomine (BENTYL) 20 MG tablet  2 times daily     05/10/19 0003           Eustaquio Maize, PA-C 05/10/19 0005    Dorie Rank, MD 05/13/19 [phone]

## 2019-05-09 NOTE — ED Notes (Signed)
Pt given crackers and some water.

## 2019-05-09 NOTE — ED Triage Notes (Signed)
Patient here from home with abd pain "all over", n/v. Reports that she was here on 11/30 for same and had an abnormal CT scan.

## 2019-05-09 NOTE — Discharge Instructions (Signed)
Your labwork did show a mild decrease in your potassium today - we have given you potassium in the ED but I am prescribing a short course to take at home as well. Please have your PCP recheck your potassium level in 1-2 weeks. If you do not have a PCP you can follow up with [facility] for primary care needs.   Take medication as needed for pain and nausea.   Please follow up with [facility] regarding the blood in your stool; this may be related to hemorrhoids.

## 2019-05-10 ENCOUNTER — Encounter (HOSPITAL_COMMUNITY): Payer: Self-pay

## 2019-05-10 ENCOUNTER — Other Ambulatory Visit: Payer: Self-pay

## 2019-05-10 DIAGNOSIS — F12188 Cannabis abuse with other cannabis-induced disorder: Secondary | ICD-10-CM | POA: Diagnosis not present

## 2019-05-10 DIAGNOSIS — Z87891 Personal history of nicotine dependence: Secondary | ICD-10-CM | POA: Insufficient documentation

## 2019-05-10 DIAGNOSIS — Z79899 Other long term (current) drug therapy: Secondary | ICD-10-CM | POA: Diagnosis not present

## 2019-05-10 DIAGNOSIS — R112 Nausea with vomiting, unspecified: Secondary | ICD-10-CM | POA: Diagnosis not present

## 2019-05-10 DIAGNOSIS — R1084 Generalized abdominal pain: Secondary | ICD-10-CM | POA: Diagnosis present

## 2019-05-10 MED ORDER — POTASSIUM CHLORIDE ER 10 MEQ PO TBCR: 10.0000 meq | EXTENDED_RELEASE_TABLET | Freq: Every day | ORAL | 0 refills | Status: DC

## 2019-05-10 MED ORDER — DICYCLOMINE HCL 20 MG PO TABS: 20.0000 mg | ORAL_TABLET | Freq: Two times a day (BID) | ORAL | 0 refills | Status: DC

## 2019-05-10 MED ORDER — ONDANSETRON 4 MG PO TBDP: 4.0000 mg | ORAL_TABLET | Freq: Three times a day (TID) | ORAL | 0 refills | Status: DC | PRN

## 2019-05-10 NOTE — ED Triage Notes (Signed)
Patient arrived with complaints of generalized abdominal pain, nausea, and vomiting that started three days ago states she was seen yesterday and given pain medication and Zofran with no relief.

## 2019-05-11 ENCOUNTER — Emergency Department (HOSPITAL_COMMUNITY)
Admission: EM | Admit: 2019-05-11 | Discharge: 2019-05-11 | Disposition: A | Discharge status: Home / Self Care | Payer: Medicaid Other | Attending: Emergency Medicine | Admitting: Emergency Medicine

## 2019-05-11 ENCOUNTER — Encounter (HOSPITAL_COMMUNITY): Payer: Self-pay | Admitting: Emergency Medicine

## 2019-05-11 DIAGNOSIS — F12188 Cannabis abuse with other cannabis-induced disorder: Secondary | ICD-10-CM

## 2019-05-11 LAB — URINE CULTURE: Culture: NO GROWTH

## 2019-05-11 MED ORDER — KETOROLAC TROMETHAMINE 60 MG/2ML IM SOLN
60.0000 mg | Freq: Once | INTRAMUSCULAR | Status: AC
  Administered 2019-05-11: 60 mg via INTRAMUSCULAR
  Filled 2019-05-11: qty 2

## 2019-05-11 MED ORDER — CAPSAICIN 0.025 % EX CREA
TOPICAL_CREAM | Freq: Two times a day (BID) | CUTANEOUS | Status: DC
  Administered 2019-05-11: 02:00:00 via TOPICAL
  Filled 2019-05-11: qty 60

## 2019-05-11 MED ORDER — HALOPERIDOL LACTATE 5 MG/ML IJ SOLN
5.0000 mg | Freq: Once | INTRAMUSCULAR | Status: AC
  Administered 2019-05-11: 02:00:00 5 mg via INTRAMUSCULAR
  Filled 2019-05-11: qty 1

## 2019-05-11 NOTE — ED Provider Notes (Addendum)
[facility] DEPT Provider Note   CSN: [id number] Arrival date & time: 05/10/19  2322     History   Chief Complaint Chief Complaint  Patient presents with  . Abdominal Pain    HPI Veronica Green is a 38 y.o. female.     The history is provided by the patient.  Abdominal Pain Pain location:  Generalized Pain quality: cramping   Pain radiates to:  Does not radiate Pain severity:  Severe Onset quality:  Gradual Timing:  Constant Progression:  Unchanged Chronicity:  Recurrent Context: not alcohol use and not diet changes   Relieved by:  Nothing Worsened by:  Nothing Ineffective treatments:  None tried Associated symptoms: nausea and vomiting   Associated symptoms: no chest pain, no cough and no fever   Risk factors: not pregnant   Patient seen multiple times this week for generalized abdominal pain and emesis presents with ongoing symptoms.  She had a negative CT scan for same.  No f/c/r.  No diarrhea.  No urinary or vaginal complaints.    Past Medical History:  Diagnosis Date  . Anxiety   . Ascites   . Sickle cell trait ([place])   . Umbilical hernia     Patient Active Problem List   Diagnosis Date Noted  . 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 Abnormal pregnancy 04/16/2019  . Unwanted fertility 08/11/2016  . Supervision of high risk pregnancy, antepartum 08/10/2016    Past Surgical History:  Procedure Laterality Date  . DILATION AND CURETTAGE OF UTERUS    . DILATION AND EVACUATION N/A 04/17/2019   Procedure: SUCTION DILATATION AND EVACUATION;  Surgeon: Aletha Halim, MD;  Location: [facility];  Service: Gynecology;  Laterality: N/A;  . OPERATIVE ULTRASOUND N/A 04/17/2019   Procedure: OPERATIVE ULTRASOUND;  Surgeon: Aletha Halim, MD;  Location: [facility];  Service: Gynecology;  Laterality: N/A;  . RIGHT OOPHORECTOMY  07/2016   diagnostic laparoscopy converted to ex lap w/ umbilical hernia repair @ [facility] (benign)  . UMBILICAL HERNIA REPAIR  07/2016    [facility]     OB History    Gravida  8   Para  5   Term  5   Preterm  0   AB  2   Living  5     SAB  0   TAB  1   Ectopic  0   Multiple  0   Live Births  5            Home Medications    Prior to Admission medications   Medication Sig Start Date End Date Taking? Authorizing Provider  dicyclomine (BENTYL) 20 MG tablet Take 1 tablet (20 mg total) by mouth 2 (two) times daily. 05/10/19   Alroy Bailiff, Margaux, PA-C  docusate sodium (COLACE) 100 MG capsule Take 1 capsule (100 mg total) by mouth 2 (two) times daily as needed. 04/17/19   Aletha Halim, MD  ibuprofen (ADVIL) 600 MG tablet Take 1 tablet (600 mg total) by mouth every 6 (six) hours as needed. Patient taking differently: Take 600 mg by mouth every 6 (six) hours as needed for mild pain.  04/17/19   Aletha Halim, MD  ondansetron (ZOFRAN ODT) 4 MG disintegrating tablet Take 1 tablet (4 mg total) by mouth every 8 (eight) hours as needed for nausea or vomiting. 05/10/19   Eustaquio Maize, PA-C  oxyCODONE-acetaminophen (PERCOCET/ROXICET) 5-325 MG tablet Take 1 tablet by mouth every 6 (six) hours as needed. 04/17/19   Aletha Halim, MD  potassium chloride (  KLOR-CON) 10 MEQ tablet Take 1 tablet (10 mEq total) by mouth daily for 5 days. 05/10/19 05/15/19  Eustaquio Maize, PA-C  pyridOXINE (VITAMIN B-6) 25 MG tablet Take 1 tablet (25 mg total) by mouth every 8 (eight) hours. Patient not taking: Reported on 05/08/2019 09/27/18   Darlina Rumpf, CNM    Family History Family History  Problem Relation Age of Onset  . Cancer Maternal Grandmother   . Cancer Paternal Grandmother   . Asthma Mother   . Multiple sclerosis Sister   . Anesthesia problems Neg Hx   . Hypotension Neg Hx   . Malignant hyperthermia Neg Hx   . Pseudochol deficiency Neg Hx     Social History Social History   Tobacco Use  . Smoking status: Former Smoker    Packs/day: 0.50    Types: Cigarettes    Quit date: 11/21/2015    Years since  quitting: 3.4  . Smokeless tobacco: Never Used  . Tobacco comment: unable to smoke last 2 wks  Substance Use Topics  . Alcohol use: No    Comment: occ  . Drug use: Yes    Types: Marijuana    Comment: stopped June 2020     Allergies   Patient has no known allergies.   Review of Systems Review of Systems  Constitutional: Negative for fever.  HENT: Negative for congestion.   Eyes: Negative for visual disturbance.  Respiratory: Negative for cough.   Cardiovascular: Negative for chest pain.  Gastrointestinal: Positive for abdominal pain, nausea and vomiting.  Genitourinary: Negative for difficulty urinating.  Musculoskeletal: Negative for arthralgias.  Neurological: Negative for dizziness.  Psychiatric/Behavioral: Negative for agitation.  All other systems reviewed and are negative.    Physical Exam Updated Vital Signs BP (!) 115/94 (BP Location: Left Arm)   Pulse 60   Temp 98.2 F (36.8 C) (Oral)   Resp 16   Ht 5\' 4"  (1.626 m)   Wt 54.4 kg   LMP 04/09/2019   SpO2 100%   BMI 20.60 kg/m   Physical Exam Vitals signs and nursing note reviewed.  Constitutional:      General: She is not in acute distress.    Appearance: Normal appearance.  HENT:     Head: Normocephalic and atraumatic.     Nose: Nose normal.  Eyes:     Conjunctiva/sclera: Conjunctivae normal.     Pupils: Pupils are equal, round, and reactive to light.  Neck:     Musculoskeletal: Normal range of motion and neck supple.  Cardiovascular:     Rate and Rhythm: Normal rate and regular rhythm.     Pulses: Normal pulses.     Heart sounds: Normal heart sounds.  Pulmonary:     Effort: Pulmonary effort is normal.     Breath sounds: Normal breath sounds.  Abdominal:     General: Abdomen is flat. Bowel sounds are normal.     Tenderness: There is no abdominal tenderness. There is no guarding or rebound.  Musculoskeletal: Normal range of motion.  Skin:    General: Skin is warm and dry.     Capillary  Refill: Capillary refill takes less than 2 seconds.  Neurological:     General: No focal deficit present.     Mental Status: She is alert and oriented to person, place, and time.     Deep Tendon Reflexes: Reflexes normal.  Psychiatric:        Behavior: Behavior is agitated and aggressive.      ED Treatments /  Results  Labs (all labs ordered are listed, but only abnormal results are displayed) Labs Reviewed - No data to display  EKG None  Radiology No results found.  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  I have reviewed the labs and CT from the previous 2 ED visits.  Patient states she has not used marijuana in over a month but UDS was positive yesterday and even with habitual use this would be negative.  The patient has been informed that she should stop using marijuana as this causes the symptoms that she is exhibiting.  Patient has PO challenged successfully.  She is stable for discharge with close follow up.    Islam Schlachter was evaluated in Emergency Department on 05/11/2019 for the symptoms described in the history of present illness. She was evaluated in the context of the global COVID-19 pandemic, which necessitated consideration that the patient might be at risk for infection with the SARS-CoV-2 virus that causes COVID-19. Institutional protocols and algorithms that pertain to the evaluation of patients at risk for COVID-19 are in a state of rapid change based on information released by regulatory bodies including the CDC and federal and state organizations. These policies  and algorithms were followed during the patient's care in the ED.   Final Clinical Impressions(s) / ED Diagnoses   Return for intractable cough, coughing up blood,fevers >100.4 unrelieved by medication, shortness of breath, intractable vomiting, chest pain, shortness of breath, weakness,numbness, changes in speech, facial asymmetry,abdominal pain, passing out,Inability to tolerate liquids or food, cough, altered mental status or any concerns. No signs of systemic illness or infection. The patient is nontoxic-appearing on exam and vital signs are within normal limits.   I have reviewed the triage vital signs and the nursing notes. Pertinent labs &imaging results that were available during my care of the patient were reviewed by me and considered in my medical decision making (see chart for details).  After history, exam, and medical workup I feel the patient has been appropriately medically screened and is safe for discharge home. Pertinent diagnoses were discussed with the patient. Patient was given return precautions       Ramadan Couey, MD 05/11/19 [place], Shanigua Gibb, MD 05/11/19 [id number]

## 2019-05-12 ENCOUNTER — Telehealth: Payer: Self-pay | Admitting: Obstetrics and Gynecology

## 2019-05-12 DIAGNOSIS — Z91199 Patient's noncompliance with other medical treatment and regimen due to unspecified reason: Secondary | ICD-10-CM

## 2019-05-12 DIAGNOSIS — Z5329 Procedure and treatment not carried out because of patient's decision for other reasons: Secondary | ICD-10-CM

## 2019-05-12 NOTE — Progress Notes (Signed)
Patient did not keep her GYN appointment for 05/12/2019.  Will have front desk try and contact patient for in person visit and labs for next week with me; okay to overbook.   Last beta on 12/1 was negative.   Durene Romans MD Attending Center for [employer] manager)

## 2019-05-12 NOTE — Progress Notes (Signed)
Called pt to initiate scheduled Mychart video visit for post op follow up. She did not answer the call.  [phone]  Second attempt to contact pt and she did not answer the call. Heard message stating that VM is full. Pt will be rescheduled for in person visit w/lab appt next week.

## 2019-05-14 ENCOUNTER — Encounter (HOSPITAL_COMMUNITY): Payer: Self-pay | Admitting: Emergency Medicine

## 2019-05-14 ENCOUNTER — Inpatient Hospital Stay (HOSPITAL_COMMUNITY)
Admission: EM | Admit: 2019-05-14 | Discharge: 2019-05-17 | DRG: 178 | Disposition: A | Discharge status: Home / Self Care | Payer: Medicaid Other | Attending: Student in an Organized Health Care Education/Training Program | Admitting: Student in an Organized Health Care Education/Training Program

## 2019-05-14 ENCOUNTER — Other Ambulatory Visit: Payer: Self-pay

## 2019-05-14 DIAGNOSIS — U071 COVID-19: Secondary | ICD-10-CM | POA: Diagnosis not present

## 2019-05-14 DIAGNOSIS — R109 Unspecified abdominal pain: Secondary | ICD-10-CM | POA: Diagnosis not present

## 2019-05-14 DIAGNOSIS — Z79899 Other long term (current) drug therapy: Secondary | ICD-10-CM

## 2019-05-14 DIAGNOSIS — Z825 Family history of asthma and other chronic lower respiratory diseases: Secondary | ICD-10-CM

## 2019-05-14 DIAGNOSIS — R1084 Generalized abdominal pain: Secondary | ICD-10-CM

## 2019-05-14 DIAGNOSIS — Z681 Body mass index (BMI) 19 or less, adult: Secondary | ICD-10-CM

## 2019-05-14 DIAGNOSIS — A0839 Other viral enteritis: Secondary | ICD-10-CM | POA: Diagnosis present

## 2019-05-14 DIAGNOSIS — E876 Hypokalemia: Secondary | ICD-10-CM | POA: Diagnosis present

## 2019-05-14 DIAGNOSIS — K5900 Constipation, unspecified: Secondary | ICD-10-CM | POA: Diagnosis present

## 2019-05-14 DIAGNOSIS — R636 Underweight: Secondary | ICD-10-CM | POA: Diagnosis present

## 2019-05-14 DIAGNOSIS — E860 Dehydration: Secondary | ICD-10-CM | POA: Diagnosis not present

## 2019-05-14 DIAGNOSIS — D573 Sickle-cell trait: Secondary | ICD-10-CM | POA: Diagnosis present

## 2019-05-14 DIAGNOSIS — Z820 Family history of epilepsy and other diseases of the nervous system: Secondary | ICD-10-CM

## 2019-05-14 DIAGNOSIS — R112 Nausea with vomiting, unspecified: Secondary | ICD-10-CM | POA: Diagnosis not present

## 2019-05-14 DIAGNOSIS — R9431 Abnormal electrocardiogram [ECG] [EKG]: Secondary | ICD-10-CM | POA: Diagnosis present

## 2019-05-14 DIAGNOSIS — Z87898 Personal history of other specified conditions: Secondary | ICD-10-CM

## 2019-05-14 HISTORY — DX: Cannabis abuse, uncomplicated: F12.10

## 2019-05-14 HISTORY — DX: Cannabis abuse with other cannabis-induced disorder: F12.188

## 2019-05-14 LAB — BASIC METABOLIC PANEL
Anion gap: 9 (ref 5–15)
BUN: 5 mg/dL — ABNORMAL LOW (ref 6–20)
CO2: 21 mmol/L — ABNORMAL LOW (ref 22–32)
Calcium: 7.4 mg/dL — ABNORMAL LOW (ref 8.9–10.3)
Chloride: 100 mmol/L (ref 98–111)
Creatinine, Ser: 0.67 mg/dL (ref 0.44–1.00)
GFR calc Af Amer: >60 mL/min (ref >60)
GFR calc non Af Amer: >60 mL/min (ref >60)
Glucose, Bld: 75 mg/dL (ref 70–99)
Potassium: 3.2 mmol/L — ABNORMAL LOW (ref 3.5–5.1)
Sodium: 130 mmol/L — ABNORMAL LOW (ref 135–145)

## 2019-05-14 LAB — COMPREHENSIVE METABOLIC PANEL
ALT: 17 U/L (ref 0–44)
AST: 18 U/L (ref 15–41)
Albumin: 3.9 g/dL (ref 3.5–5.0)
Alkaline Phosphatase: 56 U/L (ref 38–126)
Anion gap: 14 (ref 5–15)
BUN: 7 mg/dL (ref 6–20)
CO2: 26 mmol/L (ref 22–32)
Calcium: 9 mg/dL (ref 8.9–10.3)
Chloride: 94 mmol/L — ABNORMAL LOW (ref 98–111)
Creatinine, Ser: 0.65 mg/dL (ref 0.44–1.00)
GFR calc Af Amer: >60 mL/min (ref >60)
GFR calc non Af Amer: >60 mL/min (ref >60)
Glucose, Bld: 98 mg/dL (ref 70–99)
Potassium: 2.8 mmol/L — ABNORMAL LOW (ref 3.5–5.1)
Sodium: 134 mmol/L — ABNORMAL LOW (ref 135–145)
Total Bilirubin: 1.3 mg/dL — ABNORMAL HIGH (ref 0.3–1.2)
Total Protein: 7.5 g/dL (ref 6.5–8.1)

## 2019-05-14 LAB — URINALYSIS, ROUTINE W REFLEX MICROSCOPIC
Bacteria, UA: NONE SEEN
Bilirubin Urine: NEGATIVE
Glucose, UA: NEGATIVE mg/dL
Ketones, ur: 80 mg/dL — AB
Leukocytes,Ua: NEGATIVE
Nitrite: NEGATIVE
Protein, ur: 30 mg/dL — AB
Specific Gravity, Urine: 1.016 (ref 1.005–1.030)
pH: 6 (ref 5.0–8.0)

## 2019-05-14 LAB — RAPID URINE DRUG SCREEN, HOSP PERFORMED
Amphetamines: NOT DETECTED
Barbiturates: NOT DETECTED
Benzodiazepines: NOT DETECTED
Cocaine: NOT DETECTED
Opiates: POSITIVE — AB
Tetrahydrocannabinol: POSITIVE — AB

## 2019-05-14 LAB — CBC
HCT: 43.8 % (ref 36.0–46.0)
Hemoglobin: 16.4 g/dL — ABNORMAL HIGH (ref 12.0–15.0)
MCH: 31.8 pg (ref 26.0–34.0)
MCHC: 37.4 g/dL — ABNORMAL HIGH (ref 30.0–36.0)
MCV: 85 fL (ref 80.0–100.0)
Platelets: 286 10*3/uL (ref 150–400)
RBC: 5.15 MIL/uL — ABNORMAL HIGH (ref 3.87–5.11)
RDW: 11.7 % (ref 11.5–15.5)
WBC: 8.1 10*3/uL (ref 4.0–10.5)
nRBC: 0 % (ref 0.0–0.2)

## 2019-05-14 LAB — SARS CORONAVIRUS 2 (TAT 6-24 HRS): SARS Coronavirus 2: POSITIVE — AB

## 2019-05-14 LAB — LIPASE, BLOOD: Lipase: 21 U/L (ref 11–51)

## 2019-05-14 LAB — MAGNESIUM: Magnesium: 1.8 mg/dL (ref 1.7–2.4)

## 2019-05-14 LAB — I-STAT BETA HCG BLOOD, ED (MC, WL, AP ONLY): I-stat hCG, quantitative: <5 m[IU]/mL (ref <5)

## 2019-05-14 MED ORDER — POTASSIUM CHLORIDE 10 MEQ/100ML IV SOLN
10.0000 meq | INTRAVENOUS | Status: AC
  Administered 2019-05-14 (×2): 10 meq via INTRAVENOUS
  Filled 2019-05-14: qty 100

## 2019-05-14 MED ORDER — POLYETHYLENE GLYCOL 3350 17 G PO PACK: 17.0000 g | PACK | Freq: Every day | ORAL | Status: DC | PRN

## 2019-05-14 MED ORDER — LORAZEPAM 2 MG/ML IJ SOLN: 0.5000 mg | Freq: Four times a day (QID) | INTRAMUSCULAR | Status: DC | PRN

## 2019-05-14 MED ORDER — POTASSIUM CHLORIDE CRYS ER 20 MEQ PO TBCR
40.0000 meq | EXTENDED_RELEASE_TABLET | Freq: Once | ORAL | Status: AC
  Administered 2019-05-14: 40 meq via ORAL
  Filled 2019-05-14: qty 2

## 2019-05-14 MED ORDER — ACETAMINOPHEN 650 MG RE SUPP: 650.0000 mg | Freq: Four times a day (QID) | RECTAL | Status: DC | PRN

## 2019-05-14 MED ORDER — POTASSIUM CHLORIDE 10 MEQ/100ML IV SOLN
10.0000 meq | INTRAVENOUS | Status: AC
  Administered 2019-05-14 (×3): 10 meq via INTRAVENOUS
  Filled 2019-05-14 (×3): qty 100

## 2019-05-14 MED ORDER — POTASSIUM CHLORIDE 10 MEQ/100ML IV SOLN
10.0000 meq | INTRAVENOUS | Status: AC
  Administered 2019-05-14 (×3): 10 meq via INTRAVENOUS
  Filled 2019-05-14 (×3): qty 100

## 2019-05-14 MED ORDER — SODIUM CHLORIDE 0.9% FLUSH
3.0000 mL | Freq: Once | INTRAVENOUS | Status: AC
  Administered 2019-05-14: 12:00:00 3 mL via INTRAVENOUS

## 2019-05-14 MED ORDER — SODIUM CHLORIDE 0.9 % IV BOLUS
500.0000 mL | Freq: Once | INTRAVENOUS | Status: AC
  Administered 2019-05-14: 12:00:00 500 mL via INTRAVENOUS

## 2019-05-14 MED ORDER — LORAZEPAM 0.5 MG PO TABS
0.5000 mg | ORAL_TABLET | Freq: Once | ORAL | Status: AC
  Administered 2019-05-14: 14:00:00 0.5 mg via ORAL
  Filled 2019-05-14: qty 1

## 2019-05-14 MED ORDER — ENOXAPARIN SODIUM 40 MG/0.4ML ~~LOC~~ SOLN
40.0000 mg | SUBCUTANEOUS | Status: DC
  Administered 2019-05-14 – 2019-05-16 (×3): 40 mg via SUBCUTANEOUS
  Filled 2019-05-14 (×3): qty 0.4

## 2019-05-14 MED ORDER — ACETAMINOPHEN 325 MG PO TABS
650.0000 mg | ORAL_TABLET | Freq: Four times a day (QID) | ORAL | Status: DC | PRN
  Administered 2019-05-16: 15:00:00 650 mg via ORAL
  Filled 2019-05-14: qty 2

## 2019-05-14 MED ORDER — SODIUM CHLORIDE 0.9 % IV SOLN
INTRAVENOUS | Status: DC
  Administered 2019-05-14: 1 mL via INTRAVENOUS
  Administered 2019-05-15: 03:00:00 via INTRAVENOUS

## 2019-05-14 MED ORDER — SODIUM CHLORIDE 0.9 % IV BOLUS
1000.0000 mL | Freq: Once | INTRAVENOUS | Status: AC
  Administered 2019-05-14: 1000 mL via INTRAVENOUS

## 2019-05-14 MED ORDER — ACETAMINOPHEN 325 MG PO TABS: 650.0000 mg | ORAL_TABLET | Freq: Four times a day (QID) | ORAL | Status: DC | PRN

## 2019-05-14 MED ORDER — PANTOPRAZOLE SODIUM 40 MG IV SOLR
40.0000 mg | INTRAVENOUS | Status: DC
  Administered 2019-05-14 – 2019-05-16 (×3): 40 mg via INTRAVENOUS
  Filled 2019-05-14 (×4): qty 40

## 2019-05-14 NOTE — ED Notes (Signed)
[place] PA notified.

## 2019-05-14 NOTE — ED Triage Notes (Signed)
Report from [facility]> C/o continued generalized abd pain and vomiting x 8 days.  Seen at [facility] on 12/2 and diagnosed with CHS.  States she has avoided marijuana use since then.  Reports chest discomfort.

## 2019-05-14 NOTE — ED Provider Notes (Addendum)
[facility] EMERGENCY DEPARTMENT Provider Note   CSN: [id number] Arrival date & time: 05/14/19  1016     History   Chief Complaint Chief Complaint  Patient presents with  . Emesis  . Abdominal Pain  . Chest Pain    HPI Veronica Green is a 38 y.o. female with no significant past medical history apart from history of molar pregnancy, surgical history includes D&C, umbilical hernia repair [id number].      Emesis Severity:  Severe Duration:  8 days Timing:  Intermittent Quality:  Unable to specify Progression:  Unchanged Recent urination:  Decreased Context: not self-induced   Relieved by:  Nothing Worsened by:  Nothing Ineffective treatments:  Antiemetics Associated symptoms: abdominal pain   Associated symptoms: no chills, no cough, no fever, no headaches and no myalgias   Risk factors: prior abdominal surgery   Risk factors: not pregnant, no suspect food intake and no travel to endemic areas   Abdominal Pain Associated symptoms: nausea and vomiting   Associated symptoms: no chest pain, no chills, no cough, no dysuria and no fever   Chest Pain Associated symptoms: abdominal pain, nausea and vomiting   Associated symptoms: no cough, no dizziness, no fever and no headache     Patient presents for nausea, vomiting, generalized abdominal pain for 8 days.  Patient is not able to tolerate p.o. and states she has not eaten in 8 days states that she has not had a bowel movement in 7 days.  States she is unable to drink water p.o.  States she has not passed gas in the past 7 days as well. Endorses frequent marijuana use but none in the past 8 days.   States she was seen recently at [facility] for same symptoms and had CT scan and discharged however her symptoms returned soon after have not been improved even with Zofran use. Was prescribed PO K+ but has not been able to tolerate the pills for the last few days.   Patient denies any vaginal pain, discharge, pelvic  pain, urinary symptoms such as hematuria, frequency or urgency. States decreased urination and darker urine color. Patient is currently on menstrual cycle.   Patient's last hCG was also negative post hydatiform mole diagnosis and evacuation.   Past Medical History:  Diagnosis Date  . Anxiety   . Ascites   . Cannabis hyperemesis syndrome concurrent with and due to cannabis abuse ([place])   . Sickle cell trait ([place])   . Umbilical hernia     Patient Active Problem List   Diagnosis Date Noted  . 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 Abnormal pregnancy 04/16/2019  . Unwanted fertility 08/11/2016  . Supervision of high risk pregnancy, antepartum 08/10/2016    Past Surgical History:  Procedure Laterality Date  . DILATION AND CURETTAGE OF UTERUS    . DILATION AND EVACUATION N/A 04/17/2019   Procedure: SUCTION DILATATION AND EVACUATION;  Surgeon: Aletha Halim, MD;  Location: [facility];  Service: Gynecology;  Laterality: N/A;  . OPERATIVE ULTRASOUND N/A 04/17/2019   Procedure: OPERATIVE ULTRASOUND;  Surgeon: Aletha Halim, MD;  Location: [facility];  Service: Gynecology;  Laterality: N/A;  . RIGHT OOPHORECTOMY  07/2016   diagnostic laparoscopy converted to ex lap w/ umbilical hernia repair @ [facility] (benign)  . UMBILICAL HERNIA REPAIR  07/2016   [facility]     OB History    Gravida  8   Para  5   Term  5   Preterm  0   AB  2   Living  5     SAB  0   TAB  1   Ectopic  0   Multiple  0   Live Births  5            Home Medications    Prior to Admission medications   Medication Sig Start Date End Date Taking? Authorizing Provider  dicyclomine (BENTYL) 20 MG tablet Take 1 tablet (20 mg total) by mouth 2 (two) times daily. 05/10/19   Alroy Bailiff, Margaux, PA-C  docusate sodium (COLACE) 100 MG capsule Take 1 capsule (100 mg total) by mouth 2 (two) times daily as needed. 04/17/19   Aletha Halim, MD  ibuprofen (ADVIL) 600 MG tablet Take 1 tablet (600 mg total) by mouth every 6 (six) hours as  needed. Patient taking differently: Take 600 mg by mouth every 6 (six) hours as needed for mild pain.  04/17/19   Aletha Halim, MD  ondansetron (ZOFRAN ODT) 4 MG disintegrating tablet Take 1 tablet (4 mg total) by mouth every 8 (eight) hours as needed for nausea or vomiting. 05/10/19   Eustaquio Maize, PA-C  oxyCODONE-acetaminophen (PERCOCET/ROXICET) 5-325 MG tablet Take 1 tablet by mouth every 6 (six) hours as needed. 04/17/19   Aletha Halim, MD  potassium chloride (KLOR-CON) 10 MEQ tablet Take 1 tablet (10 mEq total) by mouth daily for 5 days. 05/10/19 05/15/19  Eustaquio Maize, PA-C  pyridOXINE (VITAMIN B-6) 25 MG tablet Take 1 tablet (25 mg total) by mouth every 8 (eight) hours. Patient not taking: Reported on 05/08/2019 09/27/18   Darlina Rumpf, CNM    Family History Family History  Problem Relation Age of Onset  . Cancer Maternal Grandmother   . Cancer Paternal Grandmother   . Asthma Mother   . Multiple sclerosis Sister   . Anesthesia problems Neg Hx   . Hypotension Neg Hx   . Malignant hyperthermia Neg Hx   . Pseudochol deficiency Neg Hx     Social History Social History   Tobacco Use  . Smoking status: Former Smoker    Packs/day: 0.50    Types: Cigarettes    Quit date: 11/21/2015    Years since quitting: 3.4  . Smokeless tobacco: Never Used  . Tobacco comment: unable to smoke last 2 wks  Substance Use Topics  . Alcohol use: No    Comment: occ  . Drug use: Yes    Types: Marijuana    Comment: stopped June 2020     Allergies   Patient has no known allergies.   Review of Systems Review of Systems  Constitutional: Negative for chills and fever.  HENT: Positive for congestion.   Eyes: Negative for pain.  Respiratory: Negative for cough.   Cardiovascular: Negative for chest pain.  Gastrointestinal: Positive for abdominal pain, nausea and vomiting.  Genitourinary: Negative for dysuria.  Musculoskeletal: Negative for myalgias.  Skin: Negative for rash.   Neurological: Negative for dizziness and headaches.     Physical Exam Updated Vital Signs BP 107/83 (BP Location: Left Arm)   Pulse 64   Temp 98.9 F (37.2 C) (Oral)   Resp 16   LMP 05/11/2019   SpO2 98%   Physical Exam Vitals signs and nursing note reviewed.  Constitutional:      Appearance: She is not ill-appearing.  HENT:     Head: Normocephalic and atraumatic.     Mouth/Throat:     Mouth: Mucous membranes are moist.  Eyes:     General: No scleral icterus.  Neck:     Musculoskeletal: No neck rigidity.  Cardiovascular:     Rate and Rhythm: Normal rate and regular rhythm.     Pulses: Normal pulses.     Heart sounds: Normal heart sounds.  Pulmonary:     Effort: Pulmonary effort is normal.     Breath sounds: Normal breath sounds.  Abdominal:     General: Abdomen is flat. Bowel sounds are normal. There is no distension.     Palpations: Abdomen is soft.     Tenderness: There is abdominal tenderness (Generalized abdominal tenderness. Mild LLQ tenderness. No other focal tenderness. ).  Musculoskeletal:     Right lower leg: No edema.     Left lower leg: No edema.  Skin:    General: Skin is warm and dry.     Capillary Refill: Capillary refill takes less than 2 seconds.  Neurological:     Mental Status: She is alert. Mental status is at baseline.  Psychiatric:        Behavior: Behavior normal.      ED Treatments / Results  Labs (all labs ordered are listed, but only abnormal results are displayed) Labs Reviewed  COMPREHENSIVE METABOLIC PANEL - Abnormal; Notable for the following components:      Result Value   Sodium 134 (*)    Potassium 2.8 (*)    Chloride 94 (*)    Total Bilirubin 1.3 (*)    All other components within normal limits  CBC - Abnormal; Notable for the following components:   RBC 5.15 (*)    Hemoglobin 16.4 (*)    MCHC 37.4 (*)    All other components within normal limits  URINALYSIS, ROUTINE W REFLEX MICROSCOPIC - Abnormal; Notable for the  following components:   APPearance HAZY (*)    Hgb urine dipstick LARGE (*)    Ketones, ur 80 (*)    Protein, ur 30 (*)    All other components within normal limits  RAPID URINE DRUG SCREEN, HOSP PERFORMED - Abnormal; Notable for the following components:   Opiates POSITIVE (*)    Tetrahydrocannabinol POSITIVE (*)    All other components within normal limits  SARS CORONAVIRUS 2 (TAT 6-24 HRS)  LIPASE, BLOOD  MAGNESIUM  I-STAT BETA HCG BLOOD, ED ([facility], [facility], [facility] ONLY)    EKG EKG Interpretation  Date/Time:  Sunday May 14 2019 10:26:03 EST Ventricular Rate:  71 PR Interval:  112 QRS Duration: 78 QT Interval:  532 QTC Calculation: 578 R Axis:   83 Text Interpretation: Critical Test Result: Long QTc Normal sinus rhythm Right atrial enlargement Prolonged QT Abnormal ECG No STEMI Confirmed by Octaviano Glow [phone]) on 05/14/2019 10:33:07 AM   Radiology No results found.  Procedures Procedures (including critical care time)  Medications Ordered in ED Medications  potassium chloride 10 mEq in 100 mL IVPB (10 mEq Intravenous New Bag/Given 05/14/19 1500)  sodium chloride flush (NS) 0.9 % injection 3 mL (3 mLs Intravenous Given 05/14/19 1159)  potassium chloride 10 mEq in 100 mL IVPB (0 mEq Intravenous Stopped 05/14/19 1459)  sodium chloride 0.9 % bolus 500 mL (500 mLs Intravenous New Bag/Given 05/14/19 1205)  potassium chloride SA (KLOR-CON) CR tablet 40 mEq (40 mEq Oral Given 05/14/19 1226)  sodium chloride 0.9 % bolus 1,000 mL (1,000 mLs Intravenous New Bag/Given 05/14/19 1342)  LORazepam (ATIVAN) tablet 0.5 mg (0.5 mg Oral Given 05/14/19 1341)     Initial Impression / Assessment and Plan / ED Course  I have reviewed the  triage vital signs and the nursing notes.  Pertinent labs & imaging results that were available during my care of the patient were reviewed by me and considered in my medical decision making (see chart for details).  Clinical Course as of May 13 2224  Sun May 14, 2019  2212 [facility]: 31.8 [WF]    Clinical Course User Index [WF] Tedd Sias, [place]       Patient 38 year old female with history of marijuana induced hyperemesis presents today for ED visit in the past week. Recent molar pregnancy status post D&C on 04/17/2019 presenting for evaluation abdominal pain, nausea, vomiting.  Patient appears dehydrated on physical exam has generalized abdominal pain with some left lower quadrant abdominal pain.  She denies any fevers chills or cough does endorse some congestion.  Denies any vaginal or urinary symptoms.  States that she has been peeing less frequently.  Denies any unusual food antibiotics use or travel.   CT scan conducted 11/30 showed no acute abnormalities recommended follow-up MRI for incidental liver lesions.  During this visit patient had borderline QTC prolongation at 457.  Patient had leukocytosis of 19,800 at this time.  During evaluation 05/09/19 endorsed dark tarry stools and a positive POC but HGB wnls. No obvious blood or melena on exam at that time.  Was discharged with Zofran and Bentyl with potassium 10 mEq daily.  I do not feel that repeat fecal occult testing is warranted at this time as patient has had no further episodes of dark tarry stools and her hemoglobin is within normal months.   Doubt SBO/LBO as patient is complaining of consistent symptoms with prior presentations where CT scan showed no evidence of this.  Suspect that decreased BMs due to decreased oral intake.  Doubt ectopic pregnancy as patient states she has not been sexually active recently has negative pregnancy test and has no localized tenderness.  Patient also recently had a D&C conducted.  Doubt appendicitis as she does not have localized tenderness and is afebrile without antibiotics there is no white count elevation.  Doubt diverticulitis as patient did not have evidence of diverticulosis per CT scan.  Doubt hyperaldosteronism as etiology of hyperkalemia as patient has  story consistent with emesis causing this today.  Suspect this could be either continuation of patient's prior gastroenteritis less likely to be presentation of Crohn/celiac/IBD IBS or other GI illness.   Patient most likely has some form of enteritis which is causing continued nausea vomiting and generalized abdominal pain preventing her from eating and drinking and causing hypokalemia which is present causing generalized cramping discomfort.  Is currently on her menstrual cycle which may be exacerbating her symptoms.  3:30 pm discussed with internal medicine service who agrees to accept patient for admission to hospital.   Final Clinical Impressions(s) / ED Diagnoses   Final diagnoses:  Intractable vomiting with nausea, unspecified vomiting type  Generalized abdominal pain  Hypokalemia  QT prolongation    ED Discharge Orders    None       Tedd Sias, [place] 05/14/19 [place], [place] Primo [place], [place] 05/14/19 2227    Maudie Flakes, MD 05/18/19 1722

## 2019-05-14 NOTE — ED Notes (Signed)
COVID result was verified with this RN. This RN asked pt's mother to leave bedside due to positive result.

## 2019-05-14 NOTE — ED Notes (Signed)
Pt. Stated, I feel a little better.

## 2019-05-14 NOTE — ED Notes (Signed)
Pt. Vomited after the water to drink and after given the Potassium po.

## 2019-05-14 NOTE — ED Notes (Signed)
Pt. up; to BR without assistance.

## 2019-05-14 NOTE — H&P (Addendum)
Date: 05/14/2019               Patient Name:  Veronica Green MRN: [id number]  DOB: 17-Aug-1980 Age / Sex: 38 y.o., female   PCP: Patient, No Pcp Per         Medical Service: Internal Medicine Teaching Service         Attending Physician: Dr. Evette Doffing    First Contact: Dr. Benjamine Mola Pager: [phone]  Second Contact: Dr. Myrtie Hawk Pager: [phone]       After Hours (After 5p/  First Contact Pager: [phone]  weekends / holidays): Second Contact Pager: [phone]   Chief Complaint: Nausea, vomiting, abdominal pain  History of Present Illness: Patient is a 38 year old female with PMH of a molar pregnancy who presents for an 8 day history of intermittent abdominal pain, nausea, vomiting, inability to tolerate PO intake. Also reports she has not had a bowel movement in 7 days, but passed flatus today. Patient denies sick contacts or similar symptoms in past. Patient states ate [place] food prior to onset of symptoms. Patient denies fever, chills, cough, myalgias, headache, dysuria. Patient reports sharp substernal chest pain associated with abdominal pain which is made better after emesis. Patient denies tobacco usage or personal or family history of MI. Patient reports being seen at [facility] long, had CT scan without significant finding and following discharge, symptoms returned. Patient states zofran did not help with symptoms.  Meds:  Current Meds  Medication Sig  . capsaicin (ZOSTRIX) 0.025 % cream Apply 1 application topically 2 (two) times daily as needed (stomach pain).  Marland Kitchen dicyclomine (BENTYL) 20 MG tablet Take 1 tablet (20 mg total) by mouth 2 (two) times daily.  Marland Kitchen ibuprofen (ADVIL) 600 MG tablet Take 1 tablet (600 mg total) by mouth every 6 (six) hours as needed. (Patient taking differently: Take 600 mg by mouth every 6 (six) hours as needed for mild pain. )  . ondansetron (ZOFRAN ODT) 4 MG disintegrating tablet Take 1 tablet (4 mg total) by mouth every 8 (eight) hours as needed for nausea or  vomiting.  . potassium chloride (KLOR-CON) 10 MEQ tablet Take 1 tablet (10 mEq total) by mouth daily for 5 days.   Allergies: Allergies as of 05/14/2019  . (No Known Allergies)   PMH: * Anxiety * Sickle cell trait * Molar pregnancy * Unspecified Gynecologic mass removed following delivery of child per patient   Past Medical History:  Diagnosis Date  . Anxiety   . Ascites   . Cannabis hyperemesis syndrome concurrent with and due to cannabis abuse ([place])   . Sickle cell trait ([place])   . Umbilical hernia     Family History:  * Denies family history of hypertension, diabetes, cancer  Social History:  * Denies tobacco usage, reports remote marijuana usage, denies other non-(prescription/OTC) drug usage * Patient lives with 5 kids and father * Patient states used to work at [employer] in [employer], not currently employed, patient resides in [place], patient does not have a PCP  Review of Systems: A complete ROS was negative except as per HPI.   Physical Exam: Blood pressure 107/83, pulse 64, temperature 98.9 F (37.2 C), temperature source Oral, resp. rate 16, last menstrual period 05/11/2019, SpO2 98 %, unknown if currently breastfeeding. Physical Exam  Constitutional: She is well-developed, well-nourished, and in no distress.  HENT:  Head: Normocephalic and atraumatic.  Eyes: EOM are normal. Right eye exhibits no discharge. Left eye exhibits no discharge.  Neck:  Normal range of motion. No tracheal deviation present.  Cardiovascular: Normal rate and regular rhythm. Exam reveals no gallop and no friction rub.  No murmur heard. Pulmonary/Chest: Effort normal and breath sounds normal. No respiratory distress. She has no wheezes. She has no rales.  Abdominal: Soft. She exhibits no distension. There is abdominal tenderness (Mild tenderness to palpation on right side of abdomen). There is no rebound and no guarding.  Musculoskeletal: Normal range of motion.        General: No  tenderness, deformity or edema.  Neurological: She is alert. Coordination normal.  Skin: Skin is warm and dry. No rash noted. She is not diaphoretic. No erythema.  Psychiatric: Memory and judgment normal.    EKG: personally reviewed my interpretation is no signs of acute ischemia, QTc of 578  CXR: personally reviewed my interpretation is not performed  CT Abd/Pel (05/08/2019): IMPRESSION: 1. No acute abnormality. No evidence of bowel obstruction or acute bowel inflammation. 2. Four scattered small low-attenuation liver lesions, largest 1.2 cm in the posterior right liver, not definitely seen on 2015 comparison CT abdomen study. Short-term outpatient MRI abdomen follow-up without and with IV contrast recommended for further characterization.  Assessment & Plan by Problem: Active Problems:   Abdominal pain   Nausea & vomiting   Hypokalemia   QT prolongation  Patient is a 38 year old female with no significant past medical history who presents with an 8-day history of nausea, vomiting, abdominal pain.  # Abdominal pain with nausea and vomiting, constipation: # Hypokalemia with QTc prolongation: Patient symptoms consistent with a viral gastroenteritis. Recent CT scan on 11/30 without sign of acute abnormality, does demonstrate low-attenuation liver lesions which are not thought to be related to current presentation. Will continue with symptomatic management.  * Pantoprazole 40 mg daily * Miralax 17 g daily * Ativan 0.5 mg Q6HR PRN * Pantoprazole 40 mg Q24HR * NS 100 ml/hr * Potassium 2.8 on presentation with QTc prolongation to 578, provided 10 mEq IV potassium x5. Repeat BMP, EKG pending  # Molar pregnancy: Patient had Molar pregnancy noted on ultrasound on 11/5 s/p D&C. Pathology revealed trophoblastic hyperplasia for which molar pregnancy could not be excluded. Serial B-hCG values of 135 -> 28 -> 14 -> undetectable (today) making residual trophoblastic disease as cause of  patient's symptoms unlikely.  * First undetectable value on 05/09/2019. Will need serial negative weekly values for 3 weeks. Patient established with outpatient ob/gyn practice  Diet: NPO, will advance to liquid diet tomorrow Dispo: Admit patient to Observation with expected length of stay less than 2 midnights.  Signed: Jeanmarie Hubert, MD 05/14/2019, 5:29 PM  Pager: [phone]

## 2019-05-15 DIAGNOSIS — R112 Nausea with vomiting, unspecified: Secondary | ICD-10-CM

## 2019-05-15 DIAGNOSIS — E876 Hypokalemia: Secondary | ICD-10-CM

## 2019-05-15 DIAGNOSIS — Z825 Family history of asthma and other chronic lower respiratory diseases: Secondary | ICD-10-CM | POA: Diagnosis not present

## 2019-05-15 DIAGNOSIS — K5900 Constipation, unspecified: Secondary | ICD-10-CM | POA: Diagnosis present

## 2019-05-15 DIAGNOSIS — E860 Dehydration: Secondary | ICD-10-CM | POA: Diagnosis present

## 2019-05-15 DIAGNOSIS — Z79899 Other long term (current) drug therapy: Secondary | ICD-10-CM | POA: Diagnosis not present

## 2019-05-15 DIAGNOSIS — U071 COVID-19: Secondary | ICD-10-CM | POA: Diagnosis present

## 2019-05-15 DIAGNOSIS — R9431 Abnormal electrocardiogram [ECG] [EKG]: Secondary | ICD-10-CM | POA: Diagnosis present

## 2019-05-15 DIAGNOSIS — Z820 Family history of epilepsy and other diseases of the nervous system: Secondary | ICD-10-CM | POA: Diagnosis not present

## 2019-05-15 DIAGNOSIS — R636 Underweight: Secondary | ICD-10-CM | POA: Diagnosis present

## 2019-05-15 DIAGNOSIS — D573 Sickle-cell trait: Secondary | ICD-10-CM | POA: Diagnosis present

## 2019-05-15 DIAGNOSIS — Z681 Body mass index (BMI) 19 or less, adult: Secondary | ICD-10-CM | POA: Diagnosis not present

## 2019-05-15 DIAGNOSIS — A0839 Other viral enteritis: Secondary | ICD-10-CM | POA: Diagnosis present

## 2019-05-15 LAB — CBC
HCT: 36.7 % (ref 36.0–46.0)
Hemoglobin: 13.2 g/dL (ref 12.0–15.0)
MCH: 31.4 pg (ref 26.0–34.0)
MCHC: 36 g/dL (ref 30.0–36.0)
MCV: 87.4 fL (ref 80.0–100.0)
Platelets: 248 10*3/uL (ref 150–400)
RBC: 4.2 MIL/uL (ref 3.87–5.11)
RDW: 11.9 % (ref 11.5–15.5)
WBC: 6.6 10*3/uL (ref 4.0–10.5)
nRBC: 0 % (ref 0.0–0.2)

## 2019-05-15 LAB — BASIC METABOLIC PANEL
Anion gap: 11 (ref 5–15)
BUN: <5 mg/dL — ABNORMAL LOW (ref 6–20)
CO2: 23 mmol/L (ref 22–32)
Calcium: 7.8 mg/dL — ABNORMAL LOW (ref 8.9–10.3)
Chloride: 101 mmol/L (ref 98–111)
Creatinine, Ser: 0.65 mg/dL (ref 0.44–1.00)
GFR calc Af Amer: >60 mL/min (ref >60)
GFR calc non Af Amer: >60 mL/min (ref >60)
Glucose, Bld: 70 mg/dL (ref 70–99)
Potassium: 3.2 mmol/L — ABNORMAL LOW (ref 3.5–5.1)
Sodium: 135 mmol/L (ref 135–145)

## 2019-05-15 LAB — FIBRINOGEN: Fibrinogen: 276 mg/dL (ref 210–475)

## 2019-05-15 LAB — D-DIMER, QUANTITATIVE: D-Dimer, Quant: 0.74 ug/mL-FEU — ABNORMAL HIGH (ref 0.00–0.50)

## 2019-05-15 LAB — TROPONIN I (HIGH SENSITIVITY): Troponin I (High Sensitivity): 4 ng/L (ref <18)

## 2019-05-15 LAB — LACTATE DEHYDROGENASE: LDH: 122 U/L (ref 98–192)

## 2019-05-15 LAB — HIV ANTIBODY (ROUTINE TESTING W REFLEX): HIV Screen 4th Generation wRfx: NONREACTIVE

## 2019-05-15 MED ORDER — RAMELTEON 8 MG PO TABS
8.0000 mg | ORAL_TABLET | Freq: Every day | ORAL | Status: DC
  Administered 2019-05-16: 01:00:00 8 mg via ORAL
  Filled 2019-05-15 (×3): qty 1

## 2019-05-15 MED ORDER — KCL IN DEXTROSE-NACL 40-5-0.45 MEQ/L-%-% IV SOLN
INTRAVENOUS | Status: AC
  Administered 2019-05-15: 14:00:00 via INTRAVENOUS
  Filled 2019-05-15: qty 1000

## 2019-05-15 MED ORDER — POTASSIUM CHLORIDE 10 MEQ/100ML IV SOLN: 10.0000 meq | INTRAVENOUS | Status: DC

## 2019-05-15 NOTE — Plan of Care (Signed)

## 2019-05-15 NOTE — ED Notes (Signed)
ED TO INPATIENT HANDOFF REPORT  ED Nurse Name and Phone #: Zahava Quant, [phone]  S Name/Age/Gender [name] 38 y.o. female Room/Bed: 004C/004C  Code Status   Code Status: Full Code  Home/SNF/Other Home Patient oriented to: self, place, time and situation Is this baseline? Yes   Triage Complete: Triage complete  Chief Complaint n/v  Triage Note Report from [facility]> C/o continued generalized abd pain and vomiting x 8 days.  Seen at [facility] on 12/2 and diagnosed with CHS.  States she has avoided marijuana use since then.  Reports chest discomfort.   Allergies No Known Allergies  Level of Care/Admitting Diagnosis ED Disposition    ED Disposition Condition Comment   Admit  Hospital Area: [facility] [[id number]]  Level of Care: Telemetry Medical [104]  Covid Evaluation: Confirmed COVID Positive  Diagnosis: Hypokalemia [id number]  Admitting Physician: Axel Filler [id number]  Attending Physician: Axel Filler [phone]  PT Class (Do Not Modify): Observation [104]  PT Acc Code (Do Not Modify): Observation [10022]       B Medical/Surgery History Past Medical History:  Diagnosis Date  . Anxiety   . Ascites   . Cannabis hyperemesis syndrome concurrent with and due to cannabis abuse ([place])   . Sickle cell trait ([place])   . Umbilical hernia    Past Surgical History:  Procedure Laterality Date  . DILATION AND CURETTAGE OF UTERUS    . DILATION AND EVACUATION N/A 04/17/2019   Procedure: SUCTION DILATATION AND EVACUATION;  Surgeon: Aletha Halim, MD;  Location: [facility];  Service: Gynecology;  Laterality: N/A;  . OPERATIVE ULTRASOUND N/A 04/17/2019   Procedure: OPERATIVE ULTRASOUND;  Surgeon: Aletha Halim, MD;  Location: [facility];  Service: Gynecology;  Laterality: N/A;  . RIGHT OOPHORECTOMY  07/2016   diagnostic laparoscopy converted to ex lap w/ umbilical hernia repair @ [facility] (benign)  . UMBILICAL HERNIA REPAIR  07/2016   [facility]     A IV  Location/Drains/Wounds Patient Lines/Drains/Airways Status   Active Line/Drains/Airways    Name:   Placement date:   Placement time:   Site:   Days:   Peripheral IV 05/14/19   05/14/19    1205    -   1   Incision (Closed) 04/17/19 Vagina Other (Comment)   04/17/19    1328     28          Intake/Output Last 24 hours  Intake/Output Summary (Last 24 hours) at 05/15/2019 0108 Last data filed at 05/14/2019 1459 Gross per 24 hour  Intake 1183.67 ml  Output -  Net 1183.67 ml    Labs/Imaging Results for orders placed or performed during the hospital encounter of 05/14/19 (from the past 48 hour(s))  Urinalysis, Routine w reflex microscopic     Status: Abnormal   Collection Time: 05/14/19 10:25 AM  Result Value Ref Range   Color, Urine YELLOW YELLOW   APPearance HAZY (A) CLEAR   Specific Gravity, Urine 1.016 1.005 - 1.030   pH 6.0 5.0 - 8.0   Glucose, UA NEGATIVE NEGATIVE mg/dL   Hgb urine dipstick LARGE (A) NEGATIVE   Bilirubin Urine NEGATIVE NEGATIVE   Ketones, ur 80 (A) NEGATIVE mg/dL   Protein, ur 30 (A) NEGATIVE mg/dL   Nitrite NEGATIVE NEGATIVE   Leukocytes,Ua NEGATIVE NEGATIVE   RBC / HPF 21-50 0 - 5 RBC/hpf   WBC, UA 11-20 0 - 5 WBC/hpf   Bacteria, UA NONE SEEN NONE SEEN   Squamous Epithelial / LPF 0-5 0 -  5   Mucus PRESENT     Comment: Performed at [facility], [place] [address], [place], [place] [address]  Lipase, blood     Status: None   Collection Time: 05/14/19 10:48 AM  Result Value Ref Range   Lipase 21 11 - 51 U/L    Comment: Performed at [facility] [address], [place], [place] [address]  Comprehensive metabolic panel     Status: Abnormal   Collection Time: 05/14/19 10:48 AM  Result Value Ref Range   Sodium 134 (L) 135 - 145 mmol/L   Potassium 2.8 (L) 3.5 - 5.1 mmol/L   Chloride 94 (L) 98 - 111 mmol/L   CO2 26 22 - 32 mmol/L   Glucose, Bld 98 70 - 99 mg/dL   BUN 7 6 - 20 mg/dL   Creatinine, Ser 0.65 0.44 - 1.00 mg/dL   Calcium 9.0 8.9 -  10.3 mg/dL   Total Protein 7.5 6.5 - 8.1 g/dL   Albumin 3.9 3.5 - 5.0 g/dL   AST 18 15 - 41 U/L   ALT 17 0 - 44 U/L   Alkaline Phosphatase 56 38 - 126 U/L   Total Bilirubin 1.3 (H) 0.3 - 1.2 mg/dL   GFR calc non Af Amer >60 >60 mL/min   GFR calc Af Amer >60 >60 mL/min   Anion gap 14 5 - 15    Comment: Performed at [facility], [place] [address] [place], [place] [address]  CBC     Status: Abnormal   Collection Time: 05/14/19 10:48 AM  Result Value Ref Range   WBC 8.1 4.0 - 10.5 K/uL   RBC 5.15 (H) 3.87 - 5.11 MIL/uL   Hemoglobin 16.4 (H) 12.0 - 15.0 g/dL   HCT 43.8 36.0 - 46.0 %   MCV 85.0 80.0 - 100.0 fL   MCH 31.8 26.0 - 34.0 pg   MCHC 37.4 (H) 30.0 - 36.0 g/dL   RDW 11.7 11.5 - 15.5 %   Platelets 286 150 - 400 K/uL   nRBC 0.0 0.0 - 0.2 %    Comment: Performed at [facility], [place] [address], [place], [place] [address]  Magnesium     Status: None   Collection Time: 05/14/19 10:48 AM  Result Value Ref Range   Magnesium 1.8 1.7 - 2.4 mg/dL    Comment: Performed at [facility] [address], [place], [place] [address]  I-Stat beta hCG blood, ED     Status: None   Collection Time: 05/14/19 11:02 AM  Result Value Ref Range   I-stat hCG, quantitative <5.0 <5 mIU/mL   Comment 3            Comment:   GEST. AGE      CONC.  (mIU/mL)   <=1 WEEK        5 - 50     2 WEEKS       50 - 500     3 WEEKS       100 - 10,000     4 WEEKS     1,000 - 30,000        FEMALE AND NON-PREGNANT FEMALE:     LESS THAN 5 mIU/mL   Rapid urine drug screen (hospital performed)     Status: Abnormal   Collection Time: 05/14/19 12:20 PM  Result Value Ref Range   Opiates POSITIVE (A) NONE DETECTED   Cocaine NONE DETECTED NONE DETECTED  Benzodiazepines NONE DETECTED NONE DETECTED   Amphetamines NONE DETECTED NONE DETECTED   Tetrahydrocannabinol POSITIVE (A) NONE DETECTED   Barbiturates NONE DETECTED NONE DETECTED    Comment: (NOTE) DRUG SCREEN FOR MEDICAL PURPOSES ONLY.  IF  CONFIRMATION IS NEEDED FOR ANY PURPOSE, NOTIFY LAB WITHIN 5 DAYS. LOWEST DETECTABLE LIMITS FOR URINE DRUG SCREEN Drug Class                     Cutoff (ng/mL) Amphetamine and metabolites    1000 Barbiturate and metabolites    200 Benzodiazepine                 [id number] Tricyclics and metabolites     300 Opiates and metabolites        300 Cocaine and metabolites        300 THC                            50 Performed at [facility], [place] [address] [place], [place] [address]   SARS CORONAVIRUS 2 (TAT 6-24 HRS) Nasopharyngeal Nasopharyngeal Swab     Status: Abnormal   Collection Time: 05/14/19  1:28 PM   Specimen: Nasopharyngeal Swab  Result Value Ref Range   SARS Coronavirus 2 POSITIVE (A) NEGATIVE    Comment: RESULT CALLED TO, READ BACK BY AND VERIFIED WITH: MEGHAN Vivica Dobosz RN.@[email or web address]  ON 12.6.2020 BY TCALDWELL MT. (NOTE) SARS-CoV-2 target nucleic acids are DETECTED. The SARS-CoV-2 RNA is generally detectable in upper and lower respiratory specimens during the acute phase of infection. Positive results are indicative of the presence of SARS-CoV-2 RNA. Clinical correlation with patient history and other diagnostic information is  necessary to determine patient infection status. Positive results do not rule out bacterial infection or co-infection with other viruses.  The expected result is Negative. Fact Sheet for Patients: SugarRoll.be Fact Sheet for Healthcare Providers: https://www.woods-mathews.com/ This test is not yet approved or cleared by the [place] FDA and  has been authorized for detection and/or diagnosis of SARS-CoV-2 by FDA under an Emergency Use Authorization (EUA). This EUA will remain  in effect (meaning this test c an be used) for the duration of the COVID-19 declaration under Section 564(b)(1) of the Act, 21 U.S.C. section 360bbb-3(b)(1), unless the authorization is terminated or revoked sooner. Performed at  [facility], [place] [address] [place], [place] [id number]   Basic metabolic panel     Status: Abnormal   Collection Time: 05/14/19  7:38 PM  Result Value Ref Range   Sodium 130 (L) 135 - 145 mmol/L   Potassium 3.2 (L) 3.5 - 5.1 mmol/L   Chloride 100 98 - 111 mmol/L   CO2 21 (L) 22 - 32 mmol/L   Glucose, Bld 75 70 - 99 mg/dL   BUN 5 (L) 6 - 20 mg/dL   Creatinine, Ser 0.67 0.44 - 1.00 mg/dL   Calcium 7.4 (L) 8.9 - 10.3 mg/dL   GFR calc non Af Amer >60 >60 mL/min   GFR calc Af Amer >60 >60 mL/min   Anion gap 9 5 - 15    Comment: Performed at [facility], [place] [address], [place], [place] [address]   No results found.  Pending Labs [employer] (From admission, onward)    Start     Ordered   05/15/19 0500  AM BMP  Tomorrow morning,   R     05/14/19 1800   05/15/19  0500  CBC  Tomorrow morning,   R     05/14/19 1944   05/14/19 1923  HIV Antibody (routine testing w rflx)  (HIV Antibody (Routine testing w reflex) panel)  Once,   STAT     05/14/19 1944          Vitals/Pain Today's Vitals   05/14/19 2329 05/14/19 2330 05/14/19 2340 05/14/19 2350  BP:  112/81 132/87 114/80  Pulse:  65 (!) 54 (!) 55  Resp:  (!) 23 16 17   Temp:      TempSrc:      SpO2:  100% 100% 100%  PainSc: Asleep       Isolation Precautions No active isolations  Medications Medications  pantoprazole (PROTONIX) injection 40 mg (40 mg Intravenous Given 05/14/19 0300)  LORazepam (ATIVAN) injection 0.5 mg (has no administration in time range)  0.9 %  sodium chloride infusion (1 mL Intravenous New Bag/Given 05/14/19 1844)  enoxaparin (LOVENOX) injection 40 mg (40 mg Subcutaneous Given 05/14/19 2005)  acetaminophen (TYLENOL) tablet 650 mg (has no administration in time range)    Or  acetaminophen (TYLENOL) suppository 650 mg (has no administration in time range)  polyethylene glycol (MIRALAX / GLYCOLAX) packet 17 g (has no administration in time range)  sodium chloride flush (NS) 0.9 %  injection 3 mL (3 mLs Intravenous Given 05/14/19 1159)  potassium chloride 10 mEq in 100 mL IVPB (0 mEq Intravenous Stopped 05/14/19 1459)  sodium chloride 0.9 % bolus 500 mL (0 mLs Intravenous Stopped 05/14/19 1610)  potassium chloride SA (KLOR-CON) CR tablet 40 mEq (40 mEq Oral Given 05/14/19 1226)  sodium chloride 0.9 % bolus 1,000 mL (0 mLs Intravenous Stopped 05/14/19 1736)  LORazepam (ATIVAN) tablet 0.5 mg (0.5 mg Oral Given 05/14/19 1341)  potassium chloride 10 mEq in 100 mL IVPB (0 mEq Intravenous Stopped 05/14/19 1736)  potassium chloride 10 mEq in 100 mL IVPB (0 mEq Intravenous Stopped 05/15/19 0026)    Mobility walks Low fall risk   Focused Assessments Pulmonary Assessment Handoff:  Lung sounds:   O2 Device: Room Air        R Recommendations: See Admitting Provider Note  Report given to:   Additional Notes:

## 2019-05-15 NOTE — Progress Notes (Addendum)
    S: Patient feeling slightly improved today.  Last emesis was this morning at 2 AM.  Currently she is feeling hungry, not nauseous.  Breathing is stable, no cough, no shortness of breath.  No chest pain.  Feels like she wants to try the drink some liquids.  O:  Vitals:   05/15/19 0100 05/15/19 0900  BP: 118/79 107/60  Pulse: (!) 52 61  Resp: 19 15  Temp:  98.9 F (37.2 C)  SpO2: 100% 99%   Gen: Tired appearing woman, no distress Abd: Soft and nontender, nondistended, no rebound or guarding Lungs: Unlabored, clear breath sounds throughout with no crackles or wheezing Ext: Warm and well-perfused, normal joints Neuro: Alert, conversational, full strength in upper and lower extremities  A/P:   Principal Problem:   COVID-19 Active Problems:   Abdominal pain   Nausea & vomiting   Hypokalemia   QT prolongation  38 year old person who is otherwise healthy admitted with COVID-19 causing nausea, vomiting, and dehydration.  COVID-19: Symptomatically improving.  Symptoms started 2 days after Thanksgiving, about 8 days ago.  Not much pulmonary involvement, saturating normally on room air.  The symptom is abdominal pain causing dehydration.  Plan to check inflammatory markers with LDH, D-dimer, and fibrinogen today.  Given how long this has been going on, I doubt that antivirals would benefit much at this point.  Given no hypoxia, not likely to benefit from dexamethasone either.  We will continue with supportive care today.  Talked about the need to quarantine for at least another 1 week after discharge.  Dehydration, nausea, vomiting, and hypokalemia: All likely due to SARS-CoV-2 infection.  She is going to try oral hydration today.  We will continue with D5 half-normal saline for today.  Serum potassium 3.2 this morning, received about 90 mEq of IV potassium yesterday.  We will give another 40 mEq today.  Advance diet as patient tolerates.  Recent molar pregnancy: Beta-hCG now  undetectable.  Likely resolved.  Probably irrelevant to her current admission.  Lovenox for DVT prophylaxis.  Anticipate discharge to home tomorrow if condition continues to improve.   Axel Filler, MD 05/15/2019, 12:51 PM

## 2019-05-16 LAB — LACTATE DEHYDROGENASE: LDH: 148 U/L (ref 98–192)

## 2019-05-16 LAB — BASIC METABOLIC PANEL
Anion gap: 10 (ref 5–15)
BUN: <5 mg/dL — ABNORMAL LOW (ref 6–20)
CO2: 24 mmol/L (ref 22–32)
Calcium: 8.2 mg/dL — ABNORMAL LOW (ref 8.9–10.3)
Chloride: 100 mmol/L (ref 98–111)
Creatinine, Ser: 0.53 mg/dL (ref 0.44–1.00)
GFR calc Af Amer: >60 mL/min (ref >60)
GFR calc non Af Amer: >60 mL/min (ref >60)
Glucose, Bld: 116 mg/dL — ABNORMAL HIGH (ref 70–99)
Potassium: 3.1 mmol/L — ABNORMAL LOW (ref 3.5–5.1)
Sodium: 134 mmol/L — ABNORMAL LOW (ref 135–145)

## 2019-05-16 LAB — GLUCOSE, CAPILLARY: Glucose-Capillary: 105 mg/dL — ABNORMAL HIGH (ref 70–99)

## 2019-05-16 LAB — MAGNESIUM: Magnesium: 1.6 mg/dL — ABNORMAL LOW (ref 1.7–2.4)

## 2019-05-16 LAB — FIBRINOGEN: Fibrinogen: 320 mg/dL (ref 210–475)

## 2019-05-16 LAB — TROPONIN I (HIGH SENSITIVITY): Troponin I (High Sensitivity): 4 ng/L (ref <18)

## 2019-05-16 LAB — D-DIMER, QUANTITATIVE: D-Dimer, Quant: 0.5 ug/mL-FEU (ref 0.00–0.50)

## 2019-05-16 MED ORDER — MAGNESIUM SULFATE 2 GM/50ML IV SOLN
2.0000 g | Freq: Once | INTRAVENOUS | Status: AC
  Administered 2019-05-16: 15:00:00 2 g via INTRAVENOUS
  Filled 2019-05-16: qty 50

## 2019-05-16 MED ORDER — POTASSIUM CHLORIDE CRYS ER 20 MEQ PO TBCR
40.0000 meq | EXTENDED_RELEASE_TABLET | Freq: Once | ORAL | Status: AC
  Administered 2019-05-16: 07:00:00 40 meq via ORAL
  Filled 2019-05-16: qty 2

## 2019-05-16 MED ORDER — ONDANSETRON HCL 4 MG/2ML IJ SOLN
4.0000 mg | Freq: Three times a day (TID) | INTRAMUSCULAR | Status: DC | PRN
  Administered 2019-05-16: 4 mg via INTRAVENOUS
  Filled 2019-05-16: qty 2

## 2019-05-16 MED ORDER — KCL IN DEXTROSE-NACL 40-5-0.45 MEQ/L-%-% IV SOLN
INTRAVENOUS | Status: AC
  Administered 2019-05-16 (×2): via INTRAVENOUS
  Filled 2019-05-16 (×3): qty 1000

## 2019-05-16 NOTE — Progress Notes (Signed)
   S: Patient with more nausea and vomiting.  She tolerated liquids yesterday morning, advanced to soft foods in the afternoon.  Vomited once overnight.  Then vomited again early this morning.  No increase in abdominal pain.  Breathing is stable, no shortness of breath, no cough.  No diarrhea.  No dysuria.  No fevers or chills.  O:  Vitals:   05/16/19 0407 05/16/19 0747  BP: 107/75   Pulse:    Resp:    Temp: 98.6 F (37 C)   SpO2:  98%   Gen: Nauseous appearing woman, appears tired Abd: Soft, nontender, nondistended, no rebound or guarding Ext: Warm, well-perfused Neuro: Alert, conversational, full strength in the upper and lower extremities  A/P:  Principal Problem:   COVID-19 Active Problems:   Abdominal pain   Nausea & vomiting   Hypokalemia   QT prolongation  Hospital day #3 for this 38 year old person who is otherwise healthy admitted with COVID-19.  [id number]: Almost certainly the cause of her persistent nausea, vomiting, inability to tolerate oral intake.  She is about 9 days into her course of illness.  I doubt remdesivir will be helpful at this point given how long this is going on.  She has no significant pulmonary involvement, so I do not think she would benefit from dexamethasone.  She had a mildly elevated D-dimer but LDH and fibrinogen were normal yesterday.  I was hopeful that she will have turned the corner, but still with significant symptoms today.  Plan to recheck inflammatory markers today.  Continue with supportive care.  For the nausea we will use IV lorazepam 0.5 mg as needed and IV Zofran 4 mg as needed (I reviewed EKG which has a normal QT interval).  Hypokalemia: This is due to poor oral intake.  She was unable to tolerate oral potassium supplementation this morning, she threw up after trying.  We will use IV fluids today, D5 half-normal saline with 40 mEq of potassium additive at 100 ml/h for 10 hours.  Lovenox for DVT prophylaxis  Clear liquid diet today  but can advance to softs later this afternoon if the patient is less nauseous.  Full code  Axel Filler, MD 05/16/2019, 7:57 AM

## 2019-05-17 ENCOUNTER — Telehealth: Payer: Self-pay | Admitting: Obstetrics and Gynecology

## 2019-05-17 LAB — GLUCOSE, CAPILLARY: Glucose-Capillary: 85 mg/dL (ref 70–99)

## 2019-05-17 LAB — BASIC METABOLIC PANEL
Anion gap: 9 (ref 5–15)
BUN: <5 mg/dL — ABNORMAL LOW (ref 6–20)
CO2: 25 mmol/L (ref 22–32)
Calcium: 8.4 mg/dL — ABNORMAL LOW (ref 8.9–10.3)
Chloride: 104 mmol/L (ref 98–111)
Creatinine, Ser: 0.61 mg/dL (ref 0.44–1.00)
GFR calc Af Amer: >60 mL/min (ref >60)
GFR calc non Af Amer: >60 mL/min (ref >60)
Glucose, Bld: 104 mg/dL — ABNORMAL HIGH (ref 70–99)
Potassium: 3.8 mmol/L (ref 3.5–5.1)
Sodium: 138 mmol/L (ref 135–145)

## 2019-05-17 MED ORDER — ACETAMINOPHEN 325 MG PO TABS: 650.0000 mg | ORAL_TABLET | Freq: Four times a day (QID) | ORAL | 0 refills | Status: DC | PRN

## 2019-05-17 NOTE — Discharge Instructions (Signed)
You were treated for COVID-19 which caused several days of nausea, vomiting, and dehydration.  Your symptoms improved with IV fluids.  I recommend you continue to have a diet of liquids, advance your diet to solid foods slowly.  I recommend you continue quarantining at home until at least 12/16.

## 2019-05-17 NOTE — Progress Notes (Signed)
Patient was discharged home by MD order; discharged instructions  review and give to patient with care notes, IV DIC; skin intact; patient will be escorted to the car by nurse tech via wheelchair.

## 2019-05-17 NOTE — Discharge Summary (Addendum)
Name: Veronica Green MRN: [id number] DOB: 09-05-1980 38 y.o. PCP: Patient, No Pcp Per  Date of Admission: 05/14/2019 10:16 AM Date of Discharge: 05/17/2019 Attending Physician: Axel Filler, *  Discharge Diagnosis: 1.  COVID-19 2.  Dehydration, nausea, vomiting, weakness 3.  Hypokalemia  Discharge Medications: Allergies as of 05/17/2019   No Known Allergies     Medication List    STOP taking these medications   capsaicin 0.025 % cream Commonly known as: ZOSTRIX   dicyclomine 20 MG tablet Commonly known as: BENTYL   docusate sodium 100 MG capsule Commonly known as: COLACE   ondansetron 4 MG disintegrating tablet Commonly known as: Zofran ODT   oxyCODONE-acetaminophen 5-325 MG tablet Commonly known as: PERCOCET/ROXICET   potassium chloride 10 MEQ tablet Commonly known as: KLOR-CON   pyridOXINE 25 MG tablet Commonly known as: VITAMIN B-6     TAKE these medications   acetaminophen 325 MG tablet Commonly known as: TYLENOL Take 2 tablets (650 mg total) by mouth every 6 (six) hours as needed for mild pain (or Fever >/= 101).   ibuprofen 600 MG tablet Commonly known as: ADVIL Take 1 tablet (600 mg total) by mouth every 6 (six) hours as needed. What changed: reasons to take this       Disposition and follow-up:   Ms.Loria Koester was discharged from [facility] in Good condition.   Follow-up Appointments:  Patient to follow-up with gynecology regarding recent molar pregnancy.  Needs routine beta-hCG monitoring.  Can follow-up after the 1 week of quarantine.    Hospital Course by problem list: 1.  COVID-19: Patient presented to the hospital with a syndrome of nausea, vomiting, dehydration, weakness, and severe hypokalemia on labs for about 1 week prior to presentation.  Screening PCR came back positive for SARS-CoV-2.  Patient had no pulmonary involvements, no shortness of breath, no hypoxia.  Seems to be limited to GI discomforts.  She  continues to have low oral intake for the first 2 days of the hospitalization.  Along with intermittent vomiting.  Required IV fluids, IV potassium, IV magnesium.  By the day of discharge she was tolerating the liquid diet well.  Her weakness was improving.  Her potassium had returned to normal.  We talked about the need to quarantine for 1 week after this hospitalization, which she is planning to do at home.  2.  Patient had a recent history of a molar pregnancy that was treated with D&C procedure in late November.  Her beta-hCG levels have trended down and it was undetectable by the time of admission.  She is going to follow-up with her gynecologist for routine to continue trending the beta-hCG to ensure that it stays undetectable.  Discharge Vitals:   BP 116/68   Pulse 61   Temp 98.2 F (36.8 C)   Resp 17   Ht 5\' 4"  (1.626 m)   Wt 45.7 kg   LMP 05/11/2019   SpO2 98%   BMI 17.29 kg/m   Pertinent Labs, Studies, and Procedures:  Positive SARS-CoV-2 PCR   Discharge Instructions: Discharge Instructions    Call MD for:  persistant nausea and vomiting   Complete by: As directed    Diet full liquid   Complete by: As directed    Increase activity slowly   Complete by: As directed    MyChart COVID-19 home monitoring program   Complete by: May 18, 2019    Is the patient willing to use the [facility] for  home monitoring?: Yes      Signed: Axel Filler, MD 05/17/2019, 8:47 AM

## 2019-05-17 NOTE — Care Management (Addendum)
Pt to discharge home today via private vehicle.  Pt confirms that  she is ndependent from home.  Pt confirms she does not have insurance nor PCP - pt does however have an active relationship with her obgyn.  Pt is in agreement for CM to arrange post discharge follow up appt with [facility]. CM set up the first available appt - see AVS.  SCC informed pt is covid positive and will need a televisit.  Pt discharging home on over-the-counter medications. Pt has thermometer in the home.  No CM needs identified - CM signing off

## 2019-06-20 ENCOUNTER — Inpatient Hospital Stay: Payer: Self-pay | Admitting: Family Medicine

## 2020-04-26 ENCOUNTER — Inpatient Hospital Stay (HOSPITAL_COMMUNITY): Payer: Medicaid Other

## 2020-04-26 ENCOUNTER — Encounter (HOSPITAL_COMMUNITY): Payer: Self-pay | Admitting: Emergency Medicine

## 2020-04-26 ENCOUNTER — Other Ambulatory Visit: Payer: Self-pay

## 2020-04-26 ENCOUNTER — Inpatient Hospital Stay (HOSPITAL_COMMUNITY)
Admission: EM | Admit: 2020-04-26 | Discharge: 2020-04-26 | Disposition: A | Discharge status: Home / Self Care | Payer: Medicaid Other | Attending: Obstetrics & Gynecology | Admitting: Obstetrics & Gynecology

## 2020-04-26 DIAGNOSIS — Z3A01 Less than 8 weeks gestation of pregnancy: Secondary | ICD-10-CM | POA: Diagnosis not present

## 2020-04-26 DIAGNOSIS — O219 Vomiting of pregnancy, unspecified: Secondary | ICD-10-CM

## 2020-04-26 DIAGNOSIS — E876 Hypokalemia: Secondary | ICD-10-CM | POA: Diagnosis not present

## 2020-04-26 DIAGNOSIS — Z87891 Personal history of nicotine dependence: Secondary | ICD-10-CM | POA: Insufficient documentation

## 2020-04-26 DIAGNOSIS — O211 Hyperemesis gravidarum with metabolic disturbance: Secondary | ICD-10-CM

## 2020-04-26 DIAGNOSIS — O99891 Other specified diseases and conditions complicating pregnancy: Secondary | ICD-10-CM | POA: Diagnosis not present

## 2020-04-26 DIAGNOSIS — O99321 Drug use complicating pregnancy, first trimester: Secondary | ICD-10-CM

## 2020-04-26 DIAGNOSIS — R1010 Upper abdominal pain, unspecified: Secondary | ICD-10-CM | POA: Diagnosis not present

## 2020-04-26 DIAGNOSIS — F12188 Cannabis abuse with other cannabis-induced disorder: Secondary | ICD-10-CM

## 2020-04-26 DIAGNOSIS — O26891 Other specified pregnancy related conditions, first trimester: Secondary | ICD-10-CM

## 2020-04-26 DIAGNOSIS — Z3491 Encounter for supervision of normal pregnancy, unspecified, first trimester: Secondary | ICD-10-CM

## 2020-04-26 LAB — CBC WITH DIFFERENTIAL/PLATELET
Abs Immature Granulocytes: 0.06 10*3/uL (ref 0.00–0.07)
Basophils Absolute: 0 10*3/uL (ref 0.0–0.1)
Basophils Relative: 0 %
Eosinophils Absolute: 0 10*3/uL (ref 0.0–0.5)
Eosinophils Relative: 0 %
HCT: 38.3 % (ref 36.0–46.0)
Hemoglobin: 14.2 g/dL (ref 12.0–15.0)
Immature Granulocytes: 0 %
Lymphocytes Relative: 9 %
Lymphs Abs: 1.4 10*3/uL (ref 0.7–4.0)
MCH: 32.1 pg (ref 26.0–34.0)
MCHC: 37.1 g/dL — ABNORMAL HIGH (ref 30.0–36.0)
MCV: 86.7 fL (ref 80.0–100.0)
Monocytes Absolute: 0.3 10*3/uL (ref 0.1–1.0)
Monocytes Relative: 2 %
Neutro Abs: 13.5 10*3/uL — ABNORMAL HIGH (ref 1.7–7.7)
Neutrophils Relative %: 89 %
Platelets: 365 10*3/uL (ref 150–400)
RBC: 4.42 MIL/uL (ref 3.87–5.11)
RDW: 11.4 % — ABNORMAL LOW (ref 11.5–15.5)
WBC: 15.3 10*3/uL — ABNORMAL HIGH (ref 4.0–10.5)
nRBC: 0 % (ref 0.0–0.2)

## 2020-04-26 LAB — I-STAT BETA HCG BLOOD, ED (MC, WL, AP ONLY): I-stat hCG, quantitative: >2000 m[IU]/mL — ABNORMAL HIGH (ref <5)

## 2020-04-26 LAB — URINALYSIS, ROUTINE W REFLEX MICROSCOPIC
Bacteria, UA: NONE SEEN
Bilirubin Urine: NEGATIVE
Glucose, UA: NEGATIVE mg/dL
Ketones, ur: 80 mg/dL — AB
Leukocytes,Ua: NEGATIVE
Nitrite: NEGATIVE
Protein, ur: 100 mg/dL — AB
RBC / HPF: >50 RBC/hpf — ABNORMAL HIGH (ref 0–5)
Specific Gravity, Urine: 1.031 — ABNORMAL HIGH (ref 1.005–1.030)
pH: 5 (ref 5.0–8.0)

## 2020-04-26 LAB — COMPREHENSIVE METABOLIC PANEL
ALT: 17 U/L (ref 0–44)
AST: 21 U/L (ref 15–41)
Albumin: 4.3 g/dL (ref 3.5–5.0)
Alkaline Phosphatase: 44 U/L (ref 38–126)
Anion gap: 15 (ref 5–15)
BUN: 8 mg/dL (ref 6–20)
CO2: 17 mmol/L — ABNORMAL LOW (ref 22–32)
Calcium: 9.4 mg/dL (ref 8.9–10.3)
Chloride: 104 mmol/L (ref 98–111)
Creatinine, Ser: 0.67 mg/dL (ref 0.44–1.00)
GFR, Estimated: >60 mL/min (ref >60)
Glucose, Bld: 130 mg/dL — ABNORMAL HIGH (ref 70–99)
Potassium: 2.9 mmol/L — ABNORMAL LOW (ref 3.5–5.1)
Sodium: 136 mmol/L (ref 135–145)
Total Bilirubin: 0.9 mg/dL (ref 0.3–1.2)
Total Protein: 7.4 g/dL (ref 6.5–8.1)

## 2020-04-26 LAB — RAPID URINE DRUG SCREEN, HOSP PERFORMED
Amphetamines: NOT DETECTED
Barbiturates: NOT DETECTED
Benzodiazepines: NOT DETECTED
Cocaine: NOT DETECTED
Opiates: NOT DETECTED
Tetrahydrocannabinol: POSITIVE — AB

## 2020-04-26 LAB — HCG, QUANTITATIVE, PREGNANCY: hCG, Beta Chain, Quant, S: 63254 m[IU]/mL — ABNORMAL HIGH (ref <5)

## 2020-04-26 LAB — LIPASE, BLOOD: Lipase: 24 U/L (ref 11–51)

## 2020-04-26 MED ORDER — PROMETHAZINE HCL 25 MG/ML IJ SOLN
12.5000 mg | Freq: Once | INTRAMUSCULAR | Status: AC
  Administered 2020-04-26: 12.5 mg via INTRAVENOUS
  Filled 2020-04-26: qty 1

## 2020-04-26 MED ORDER — POTASSIUM CHLORIDE 2 MEQ/ML IV SOLN
INTRAVENOUS | Status: AC
  Filled 2020-04-26 (×8): qty 1000

## 2020-04-26 MED ORDER — KCL-LACTATED RINGERS 20 MEQ/L IV SOLN
INTRAVENOUS | Status: DC
  Filled 2020-04-26: qty 1000

## 2020-04-26 MED ORDER — LACTATED RINGERS IV BOLUS
1000.0000 mL | Freq: Once | INTRAVENOUS | Status: AC
  Administered 2020-04-26: 1000 mL via INTRAVENOUS

## 2020-04-26 MED ORDER — ONDANSETRON 4 MG PO TBDP
4.0000 mg | ORAL_TABLET | Freq: Once | ORAL | Status: AC
  Administered 2020-04-26: 4 mg via ORAL
  Filled 2020-04-26: qty 1

## 2020-04-26 MED ORDER — PROMETHAZINE HCL 25 MG PO TABS: 12.5000 mg | ORAL_TABLET | Freq: Four times a day (QID) | ORAL | 2 refills | Status: DC | PRN

## 2020-04-26 MED ORDER — M.V.I. ADULT IV INJ
Freq: Once | INTRAVENOUS | Status: DC
  Filled 2020-04-26: qty 1000

## 2020-04-26 MED ORDER — HYDROXYZINE HCL 25 MG PO TABS: 25.0000 mg | ORAL_TABLET | Freq: Four times a day (QID) | ORAL | 2 refills | Status: DC

## 2020-04-26 MED ORDER — SCOPOLAMINE 1 MG/3DAYS TD PT72
1.0000 | MEDICATED_PATCH | TRANSDERMAL | Status: DC
  Administered 2020-04-26: 1.5 mg via TRANSDERMAL
  Filled 2020-04-26: qty 1

## 2020-04-26 MED ORDER — HALOPERIDOL LACTATE 5 MG/ML IJ SOLN
5.0000 mg | Freq: Four times a day (QID) | INTRAMUSCULAR | Status: DC | PRN
  Administered 2020-04-26: 5 mg via INTRAVENOUS
  Filled 2020-04-26 (×2): qty 1

## 2020-04-26 MED ORDER — POTASSIUM CHLORIDE 2 MEQ/ML IV SOLN: Freq: Once | INTRAVENOUS | Status: DC

## 2020-04-26 MED ORDER — FAMOTIDINE IN NACL 20-0.9 MG/50ML-% IV SOLN
20.0000 mg | Freq: Once | INTRAVENOUS | Status: AC
  Administered 2020-04-26: 20 mg via INTRAVENOUS
  Filled 2020-04-26: qty 50

## 2020-04-26 MED ORDER — HYDROXYZINE HCL 50 MG PO TABS
50.0000 mg | ORAL_TABLET | Freq: Once | ORAL | Status: AC
  Administered 2020-04-26: 50 mg via ORAL
  Filled 2020-04-26: qty 1

## 2020-04-26 MED ORDER — SCOPOLAMINE 1 MG/3DAYS TD PT72: 1.0000 | MEDICATED_PATCH | TRANSDERMAL | 12 refills | Status: DC

## 2020-04-26 NOTE — ED Triage Notes (Signed)
Emergency Medicine Provider Triage Evaluation Note  Veronica Green , a 39 y.o. female  was evaluated in triage.  Pt complains of vomiting n pregancy.  Review of Systems  Positive: Vomiting and epigastric pain Negative: Vaginal bleeding  Physical Exam  BP 127/85   Pulse 84   Temp 98.4 F (36.9 C) (Oral)   Resp (!) 24   SpO2 99%  Gen:   Awake, no distress   HEENT:  Atraumatic  Resp:  Normal effort  Cardiac:  Normal rate  Abd:   Nondistended, nontender  MSK:   Moves extremities without difficulty  Neuro:  Speech clear   Medical Decision Making  Medically screening exam initiated at 12:11 PM.  Appropriate orders placed.  Veronica Green was informed that the remainder of the evaluation will be completed by another provider, this initial triage assessment does not replace that evaluation, and the importance of remaining in the ED until their evaluation is complete.  Clinical Impression  Patient with vomiting in early pregnancy  Accepted by APP Lattie Haw in MAU 12:12 PM   Margarita Mail, PA-C 04/26/20 1213

## 2020-04-26 NOTE — MAU Note (Signed)
Pt sleeping. NAD. Side rails up-call bell with in reach.

## 2020-04-26 NOTE — MAU Note (Signed)
Pt reports generalized muscle cramping and restlessness. Nausea has improved. Pain across diaphragm improved as well.

## 2020-04-26 NOTE — MAU Provider Note (Addendum)
Chief Complaint: Abdominal Pain, Emesis, and Nausea   First Provider Initiated Contact with Patient 04/26/20 1702      SUBJECTIVE HPI: Veronica Green is a 39 y.o. [id number] at [redacted]w[redacted]d by LMP who presents to maternity admissions reporting onset of n/v 2 days ago with vomiting 10+ times in 24 hours. She smokes marijuana and reports some nausea when she is not pregnant but similar severe symptoms in previous pregnancies.  She reports some associated abdominal pain, mostly in the upper abdomen.    Location: upper abdomen, some in lower abdomen Quality: cramping, sharp Severity:7/10 on pain scale Duration: 2 days Timing: intermittent Modifying factors: vomiting makes upper abdominal pain worse Associated signs and symptoms: n/v  HPI  Past Medical History:  Diagnosis Date  . Anxiety   . Ascites   . Cannabis hyperemesis syndrome concurrent with and due to cannabis abuse ([place])   . Sickle cell trait ([place])   . Umbilical hernia    Past Surgical History:  Procedure Laterality Date  . DILATION AND CURETTAGE OF UTERUS    . DILATION AND EVACUATION N/A 04/17/2019   Procedure: SUCTION DILATATION AND EVACUATION;  Surgeon: Aletha Halim, MD;  Location: [place];  Service: Gynecology;  Laterality: N/A;  . OPERATIVE ULTRASOUND N/A 04/17/2019   Procedure: OPERATIVE ULTRASOUND;  Surgeon: Aletha Halim, MD;  Location: [place];  Service: Gynecology;  Laterality: N/A;  . RIGHT OOPHORECTOMY  07/2016   diagnostic laparoscopy converted to ex lap w/ umbilical hernia repair @ [facility] (benign)  . UMBILICAL HERNIA REPAIR  07/2016   [facility]   Social History   Socioeconomic History  . Marital status: Single    Spouse name: Not on file  . Number of children: Not on file  . Years of education: Not on file  . Highest education level: Not on file  Occupational History  . Not on file  Tobacco Use  . Smoking status: Former Smoker    Packs/day: 0.50    Types: Cigarettes    Quit date: 11/21/2015    Years since quitting:  4.4  . Smokeless tobacco: Never Used  . Tobacco comment: unable to smoke last 2 wks  Vaping Use  . Vaping Use: Never used  Substance and Sexual Activity  . Alcohol use: No    Comment: occ  . Drug use: Yes    Types: Marijuana    Comment: stopped June 2020  . Sexual activity: Yes    Partners: Male    Birth control/protection: Abstinence, None  Other Topics Concern  . Not on file  Social History Narrative  . Not on file   Social Determinants of Health   Financial Resource Strain:   . Difficulty of Paying Living Expenses: Not on file  Food Insecurity:   . Worried About Charity fundraiser in the Last Year: Not on file  . Ran Out of Food in the Last Year: Not on file  Transportation Needs:   . Lack of Transportation (Medical): Not on file  . Lack of Transportation (Non-Medical): Not on file  Physical Activity:   . Days of Exercise per Week: Not on file  . Minutes of Exercise per Session: Not on file  Stress:   . Feeling of Stress : Not on file  Social Connections:   . Frequency of Communication with Friends and Family: Not on file  . Frequency of Social Gatherings with Friends and Family: Not on file  . Attends Religious Services: Not on file  . Active Member of Clubs  or Organizations: Not on file  . Attends Archivist Meetings: Not on file  . Marital Status: Not on file  Intimate Partner Violence:   . Fear of Current or Ex-Partner: Not on file  . Emotionally Abused: Not on file  . Physically Abused: Not on file  . Sexually Abused: Not on file   No current facility-administered medications on file prior to encounter.   Current Outpatient Medications on File Prior to Encounter  Medication Sig Dispense Refill  . acetaminophen (TYLENOL) 325 MG tablet Take 2 tablets (650 mg total) by mouth every 6 (six) hours as needed for mild pain (or Fever >/= 101). 30 tablet 0  . ibuprofen (ADVIL) 600 MG tablet Take 1 tablet (600 mg total) by mouth every 6 (six) hours as  needed. (Patient taking differently: Take 600 mg by mouth every 6 (six) hours as needed for mild pain. ) 30 tablet 0   No Known Allergies  ROS:  Review of Systems  Constitutional: Negative for chills, fatigue and fever.  Respiratory: Negative for shortness of breath.   Cardiovascular: Negative for chest pain.  Gastrointestinal: Positive for abdominal pain, diarrhea and vomiting.  Genitourinary: Negative for difficulty urinating, dysuria, flank pain, pelvic pain, vaginal bleeding, vaginal discharge and vaginal pain.  Neurological: Negative for dizziness and headaches.  Psychiatric/Behavioral: Positive for agitation. The patient is nervous/anxious.      I have reviewed patient's Past Medical Hx, Surgical Hx, Family Hx, Social Hx, medications and allergies.   Physical Exam   Patient Vitals for the past 24 hrs:  BP Temp Temp src Pulse Resp SpO2 Height Weight  04/26/20 1744 132/62 -- -- 72 16 -- -- --  04/26/20 1324 (!) 133/98 (!) 97.5 F (36.4 C) Oral 72 20 100 % 5\' 3"  (1.6 m) 44.2 kg  04/26/20 1138 -- -- -- -- -- 99 % -- --  04/26/20 1136 127/85 98.4 F (36.9 C) Oral 84 (!) 24 100 % -- --   Constitutional: Well-developed, well-nourished female in no acute distress.  HEART: normal rate, heart sounds, regular rhythm RESP: normal effort, lung sounds clear and equal bilaterally GI: Abd soft, non-tender. Pos BS x 4 MS: Extremities nontender, no edema, normal ROM Neurologic: Alert and oriented x 4.  GU: Neg CVAT.  PELVIC EXAM: Deferred   LAB RESULTS Results for orders placed or performed during the hospital encounter of 04/26/20 (from the past 24 hour(s))  CBC with Differential     Status: Abnormal   Collection Time: 04/26/20 11:42 AM  Result Value Ref Range   WBC 15.3 (H) 4.0 - 10.5 K/uL   RBC 4.42 3.87 - 5.11 MIL/uL   Hemoglobin 14.2 12.0 - 15.0 g/dL   HCT 38.3 36 - 46 %   MCV 86.7 80.0 - 100.0 fL   MCH 32.1 26.0 - 34.0 pg   MCHC 37.1 (H) 30.0 - 36.0 g/dL   RDW 11.4 (L)  11.5 - 15.5 %   Platelets 365 150 - 400 K/uL   nRBC 0.0 0.0 - 0.2 %   Neutrophils Relative % 89 %   Neutro Abs 13.5 (H) 1.7 - 7.7 K/uL   Lymphocytes Relative 9 %   Lymphs Abs 1.4 0.7 - 4.0 K/uL   Monocytes Relative 2 %   Monocytes Absolute 0.3 0.1 - 1.0 K/uL   Eosinophils Relative 0 %   Eosinophils Absolute 0.0 0.0 - 0.5 K/uL   Basophils Relative 0 %   Basophils Absolute 0.0 0.0 - 0.1 K/uL  Immature Granulocytes 0 %   Abs Immature Granulocytes 0.06 0.00 - 0.07 K/uL  Lipase, blood     Status: None   Collection Time: 04/26/20 11:42 AM  Result Value Ref Range   Lipase 24 11 - 51 U/L  Comprehensive metabolic panel     Status: Abnormal   Collection Time: 04/26/20 11:42 AM  Result Value Ref Range   Sodium 136 135 - 145 mmol/L   Potassium 2.9 (L) 3.5 - 5.1 mmol/L   Chloride 104 98 - 111 mmol/L   CO2 17 (L) 22 - 32 mmol/L   Glucose, Bld 130 (H) 70 - 99 mg/dL   BUN 8 6 - 20 mg/dL   Creatinine, Ser 0.67 0.44 - 1.00 mg/dL   Calcium 9.4 8.9 - 10.3 mg/dL   Total Protein 7.4 6.5 - 8.1 g/dL   Albumin 4.3 3.5 - 5.0 g/dL   AST 21 15 - 41 U/L   ALT 17 0 - 44 U/L   Alkaline Phosphatase 44 38 - 126 U/L   Total Bilirubin 0.9 0.3 - 1.2 mg/dL   GFR, Estimated >60 >60 mL/min   Anion gap 15 5 - 15  I-Stat Beta hCG blood, ED ([facility], [facility], [facility] only)     Status: Abnormal   Collection Time: 04/26/20 11:47 AM  Result Value Ref Range   I-stat hCG, quantitative >2,000.0 (H) <5 mIU/mL   Comment 3          hCG, quantitative, pregnancy     Status: Abnormal   Collection Time: 04/26/20 11:49 AM  Result Value Ref Range   hCG, Beta Chain, Quant, S 63,254 (H) <5 mIU/mL  Urinalysis, Routine w reflex microscopic Urine, Clean Catch     Status: Abnormal   Collection Time: 04/26/20  1:38 PM  Result Value Ref Range   Color, Urine YELLOW YELLOW   APPearance HAZY (A) CLEAR   Specific Gravity, Urine 1.031 (H) 1.005 - 1.030   pH 5.0 5.0 - 8.0   Glucose, UA NEGATIVE NEGATIVE mg/dL   Hgb urine dipstick LARGE (A)  NEGATIVE   Bilirubin Urine NEGATIVE NEGATIVE   Ketones, ur 80 (A) NEGATIVE mg/dL   Protein, ur 100 (A) NEGATIVE mg/dL   Nitrite NEGATIVE NEGATIVE   Leukocytes,Ua NEGATIVE NEGATIVE   RBC / HPF >50 (H) 0 - 5 RBC/hpf   WBC, UA 0-5 0 - 5 WBC/hpf   Bacteria, UA NONE SEEN NONE SEEN   Squamous Epithelial / LPF 0-5 0 - 5   Mucus PRESENT   Urine rapid drug screen (hosp performed)     Status: Abnormal   Collection Time: 04/26/20  1:56 PM  Result Value Ref Range   Opiates NONE DETECTED NONE DETECTED   Cocaine NONE DETECTED NONE DETECTED   Benzodiazepines NONE DETECTED NONE DETECTED   Amphetamines NONE DETECTED NONE DETECTED   Tetrahydrocannabinol POSITIVE (A) NONE DETECTED   Barbiturates NONE DETECTED NONE DETECTED       IMAGING No results found.  MAU Management/MDM: Orders Placed This Encounter  Procedures  . US OB LESS THAN 14 WEEKS WITH OB TRANSVAGINAL  . CBC with Differential  . Lipase, blood  . Comprehensive metabolic panel  . Urinalysis, Routine w reflex microscopic Urine, Clean Catch  . Urine rapid drug screen (hosp performed)  . hCG, quantitative, pregnancy  . I-Stat Beta hCG blood, ED ([facility], [facility], [facility] only)  . EKG 12-Lead  . ED EKG    Meds ordered this encounter  Medications  . ondansetron (ZOFRAN-ODT) disintegrating tablet 4  mg  . lactated ringers bolus 1,000 mL  . haloperidol lactate (HALDOL) injection 5 mg  . promethazine (PHENERGAN) injection 12.5 mg  . DISCONTD: dextrose 5% lactated ringers 1,000 mL with multivitamins adult (INFUVITE ADULT) 10 mL infusion  . famotidine (PEPCID) IVPB 20 mg premix  . DISCONTD: dextrose 5% lactated ringers 1,000 mL with multivitamins adult (INFUVITE ADULT) 10 mL, potassium chloride 10 mEq infusion    Medication already received, change in rate to 999/hour, with 10 meqs of potassium in 1 hour and 1000 ml of fluid  . DISCONTD: lactated ringers with KCl 20 mEq/L infusion  . lactated ringers 1,000 mL with potassium chloride 20 mEq/L  Pediatric IV infusion  . scopolamine (TRANSDERM-SCOP) 1 MG/3DAYS 1.5 mg  . hydrOXYzine (ATARAX/VISTARIL) tablet 50 mg    Discussed pt cannabis as likely cause of or at least reason for severity of symptoms and offered Haldol as treatment for cannabis hyperemesis.  Pt reports improvement in symptoms after Haldol and Phenergan.  Abdominal pain relieved after nausea medications, Pepcid, and IV fluids.  Potassium low so replaced with 20 meqs via IV while pt in MAU.  Scopolamine patch placed while pt in MAU. Pt reports anxiety, restlessness, desires treatment.  Vistaril 50 mg PO given.  [place] pending and IV fluids running. Report to Gavin Pound, CNM.      Fatima Blank Certified Nurse-Midwife 04/26/2020  7:40 PM  Reassessment (8:22 PM)  -Provider to bedside to discuss POC. -Patient without questions or concerns. -Will give remainder of IV fluids for K+ replacement. -Discharge to home in stable condition. -Encouraged to call or return to MAU if symptoms worsen or with the onset of new symptoms.  [name] MSN, CNM Advanced Practice Provider, [facility]

## 2020-04-26 NOTE — MAU Note (Signed)
Pt reports she is only able to rest very short periods of time. Pt is not as restless as at admission. IV continues 1 hour remains on potassium replacement.

## 2020-04-26 NOTE — ED Notes (Signed)
Pt is diaphoretic, clammy, c/o severe pain right side. Right OOPHERECTOMY

## 2020-04-26 NOTE — MAU Note (Signed)
Presents with c/o N/V, states unable to keep anything down x3 days.  Reports having hot/cold flashes.  Denies VB.  LMP March 12, 2020.

## 2020-04-26 NOTE — MAU Note (Addendum)
Pt complaining of severe abdominal pain. "Its my stomach, theres nothing else to vomit,"Pt rolling side to side in bed, laying on stomach.

## 2020-04-26 NOTE — Discharge Instructions (Signed)
Prenatal Care Providers           [facility]  [address] [phone]  [facility]   [address]  [phone]  [facility] [place]       [address] [phone]            [facility] [place]     [address] [phone]          [facility] [place]   [address] [phone]  [facility]  [address] [phone]     [facility]  [address]   [phone]     [facility]  Phone: [phone]  [facility]  Phone: [phone]  [facility] Phone: [phone]  [facility] Phone: [phone]  [facility] Phone: [phone]  [facility] Phone: [phone]  [facility]  Phone: [phone]  

## 2020-04-26 NOTE — ED Triage Notes (Addendum)
To ED via [facility] from home with c/o abd pain -- possible pregnancy-- positive test 3 days ago at home. Writhing in pain. C/o abd pain severe and unable to eat anything.   Same thing happened with last pregnancy

## 2020-04-26 NOTE — MAU Note (Signed)
Pt reports feeling "a tad" better. Assisted pt up to bathroom - voided large amount

## 2020-05-25 ENCOUNTER — Encounter (HOSPITAL_COMMUNITY): Payer: Self-pay | Admitting: Obstetrics & Gynecology

## 2020-05-25 ENCOUNTER — Other Ambulatory Visit: Payer: Self-pay

## 2020-05-25 ENCOUNTER — Inpatient Hospital Stay (HOSPITAL_COMMUNITY)
Admission: AD | Admit: 2020-05-25 | Discharge: 2020-05-25 | Disposition: A | Discharge status: Home / Self Care | Payer: Medicaid Other | Attending: Obstetrics & Gynecology | Admitting: Obstetrics & Gynecology

## 2020-05-25 DIAGNOSIS — O209 Hemorrhage in early pregnancy, unspecified: Secondary | ICD-10-CM | POA: Diagnosis not present

## 2020-05-25 DIAGNOSIS — Z3A10 10 weeks gestation of pregnancy: Secondary | ICD-10-CM | POA: Diagnosis not present

## 2020-05-25 DIAGNOSIS — R1030 Lower abdominal pain, unspecified: Secondary | ICD-10-CM | POA: Diagnosis not present

## 2020-05-25 DIAGNOSIS — Z3491 Encounter for supervision of normal pregnancy, unspecified, first trimester: Secondary | ICD-10-CM

## 2020-05-25 DIAGNOSIS — O26891 Other specified pregnancy related conditions, first trimester: Secondary | ICD-10-CM | POA: Insufficient documentation

## 2020-05-25 DIAGNOSIS — F419 Anxiety disorder, unspecified: Secondary | ICD-10-CM | POA: Diagnosis not present

## 2020-05-25 DIAGNOSIS — Z79899 Other long term (current) drug therapy: Secondary | ICD-10-CM | POA: Insufficient documentation

## 2020-05-25 DIAGNOSIS — Z87891 Personal history of nicotine dependence: Secondary | ICD-10-CM | POA: Diagnosis not present

## 2020-05-25 DIAGNOSIS — O99341 Other mental disorders complicating pregnancy, first trimester: Secondary | ICD-10-CM | POA: Diagnosis not present

## 2020-05-25 DIAGNOSIS — A599 Trichomoniasis, unspecified: Secondary | ICD-10-CM | POA: Insufficient documentation

## 2020-05-25 LAB — URINALYSIS, ROUTINE W REFLEX MICROSCOPIC
Bilirubin Urine: NEGATIVE
Glucose, UA: NEGATIVE mg/dL
Hgb urine dipstick: NEGATIVE
Ketones, ur: NEGATIVE mg/dL
Leukocytes,Ua: NEGATIVE
Nitrite: NEGATIVE
Protein, ur: NEGATIVE mg/dL
Specific Gravity, Urine: 1.014 (ref 1.005–1.030)
pH: 7 (ref 5.0–8.0)

## 2020-05-25 LAB — WET PREP, GENITAL
Sperm: NONE SEEN
Yeast Wet Prep HPF POC: NONE SEEN

## 2020-05-25 MED ORDER — METRONIDAZOLE 500 MG PO TABS
2000.0000 mg | ORAL_TABLET | Freq: Once | ORAL | Status: AC
  Administered 2020-05-25: 13:00:00 2000 mg via ORAL
  Filled 2020-05-25: qty 4

## 2020-05-25 MED ORDER — METRONIDAZOLE 500 MG PO TABS: 2000.0000 mg | ORAL_TABLET | Freq: Once | ORAL | 0 refills | Status: DC

## 2020-05-25 NOTE — MAU Provider Note (Signed)
History     CSN: [id number]  Arrival date and time: 05/25/20 1039   Event Date/Time   First Provider Initiated Contact with Patient 05/25/20 1140      Chief Complaint  Patient presents with  . Abdominal Pain  . Vaginal Bleeding   HPI  Veronica Green is a 39 y.o. female [phone] @ [redacted]w[redacted]d here in MAU with vaginal spotting and some lower abdominal cramping.  The vaginal bleeding started at 2 am this morning. She reports intercourse more than 24 hours ago. Reports having a gush like of fluid. She notices as mild odor. Reports the discharge being light and only noticing it when she wipes. She reports some mild abdominal pain that is worse on the left side. She denies pain at this time.   OB History    Gravida  9   Para  5   Term  5   Preterm  0   AB  2   Living  5     SAB  0   IAB  1   Ectopic  0   Multiple  0   Live Births  5           Past Medical History:  Diagnosis Date  . Anxiety   . Ascites   . Cannabis hyperemesis syndrome concurrent with and due to cannabis abuse ([place])   . Sickle cell trait ([place])   . Umbilical hernia     Past Surgical History:  Procedure Laterality Date  . DILATION AND CURETTAGE OF UTERUS    . DILATION AND EVACUATION N/A 04/17/2019   Procedure: SUCTION DILATATION AND EVACUATION;  Surgeon: Aletha Halim, MD;  Location: [place];  Service: Gynecology;  Laterality: N/A;  . OPERATIVE ULTRASOUND N/A 04/17/2019   Procedure: OPERATIVE ULTRASOUND;  Surgeon: Aletha Halim, MD;  Location: [place];  Service: Gynecology;  Laterality: N/A;  . RIGHT OOPHORECTOMY  07/2016   diagnostic laparoscopy converted to ex lap w/ umbilical hernia repair @ [facility] (benign)  . UMBILICAL HERNIA REPAIR  07/2016   [facility]    Family History  Problem Relation Age of Onset  . Cancer Maternal Grandmother   . Cancer Paternal Grandmother   . Asthma Mother   . Multiple sclerosis Sister   . Anesthesia problems Neg Hx   . Hypotension Neg Hx   . Malignant hyperthermia Neg Hx    . Pseudochol deficiency Neg Hx     Social History   Tobacco Use  . Smoking status: Former Smoker    Packs/day: 0.50    Types: Cigarettes    Quit date: 11/21/2015    Years since quitting: 4.5  . Smokeless tobacco: Never Used  . Tobacco comment: unable to smoke last 2 wks  Vaping Use  . Vaping Use: Never used  Substance Use Topics  . Alcohol use: No    Comment: occ  . Drug use: Not Currently    Types: Marijuana    Comment: stopped June 2020    Allergies: No Known Allergies  Medications Prior to Admission  Medication Sig Dispense Refill Last Dose  . hydrOXYzine (ATARAX/VISTARIL) 25 MG tablet Take 1 tablet (25 mg total) by mouth every 6 (six) hours. 12 tablet 2   . promethazine (PHENERGAN) 25 MG tablet Take 0.5-1 tablets (12.5-25 mg total) by mouth every 6 (six) hours as needed for nausea. 30 tablet 2   . scopolamine (TRANSDERM-SCOP) 1 MG/3DAYS Place 1 patch (1.5 mg total) onto the skin every 3 (three) days. 10 patch 12  Results for orders placed or performed during the hospital encounter of 05/25/20 (from the past 48 hour(s))  Urinalysis, Routine w reflex microscopic Urine, Clean Catch     Status: Abnormal   Collection Time: 05/25/20 11:46 AM  Result Value Ref Range   Color, Urine YELLOW YELLOW   APPearance HAZY (A) CLEAR   Specific Gravity, Urine 1.014 1.005 - 1.030   pH 7.0 5.0 - 8.0   Glucose, UA NEGATIVE NEGATIVE mg/dL   Hgb urine dipstick NEGATIVE NEGATIVE   Bilirubin Urine NEGATIVE NEGATIVE   Ketones, ur NEGATIVE NEGATIVE mg/dL   Protein, ur NEGATIVE NEGATIVE mg/dL   Nitrite NEGATIVE NEGATIVE   Leukocytes,Ua NEGATIVE NEGATIVE    Comment: Performed at [facility] [address] [place], [place] [address]  Wet prep, genital     Status: Abnormal   Collection Time: 05/25/20 12:24 PM  Result Value Ref Range   Yeast Wet Prep HPF POC NONE SEEN NONE SEEN   Trich, Wet Prep PRESENT (A) NONE SEEN   Clue Cells Wet Prep HPF POC PRESENT (A) NONE SEEN   WBC, Wet  Prep HPF POC MANY (A) NONE SEEN   Sperm NONE SEEN     Comment: Performed at [facility], [place] [address] [place], [place] [address]   Review of Systems  Constitutional: Negative for fever.  Gastrointestinal: Positive for abdominal pain. Negative for nausea and vomiting.  Genitourinary: Positive for vaginal bleeding and vaginal discharge.   Physical Exam   Blood pressure 112/68, pulse 89, temperature 98.5 F (36.9 C), temperature source Oral, resp. rate 16, height 5\' 4"  (1.626 m), weight 45.7 kg, last menstrual period 03/12/2020, SpO2 100 %, unknown if currently breastfeeding.  Physical Exam Constitutional:      General: She is not in acute distress.    Appearance: She is well-developed. She is not toxic-appearing.  HENT:     Head: Normocephalic.  Abdominal:     Tenderness: There is no abdominal tenderness. There is no guarding or rebound.  Genitourinary:    Comments: Vagina - Small amount of white/pink vaginal discharge, some odor  Cervix - small active bleeding  Bimanual exam: Cervix closed Uterus non tender, enlarged uterus.  GC/Chlam, wet prep done Chaperone present for exam.  Skin:    General: Skin is warm.  Neurological:     Mental Status: She is alert and oriented to person, place, and time.  Psychiatric:        Behavior: Behavior normal.    MAU Course  Procedures  None  MDM  Wet prep and GC collected + fetal heart tones via doppler Bedside US performed by myself for reassurance.  History of Prolonged QT,  Flagyl could make this worse. Discussed with Dr. Harolyn Rutherford and pharmacist who recommends 2000 mg of flagyl here in MAU in order to monitor.  O positive blood type.   Assessment and Plan   A:  1. Trichomonas infection   2. Vaginal bleeding in pregnancy, first trimester   3. Presence of fetal heart sounds in first trimester     P:  Discharge home in stable condition Return to MAU if symptoms worsen TOC at prenatal visit  Partner needs  treatment at HD  Veronica Green, Veronica Pais, NP 05/25/2020 3:50 PM

## 2020-05-25 NOTE — MAU Note (Signed)
Veronica Green is a 39 y.o. at [redacted]w[redacted]d here in MAU reporting: around 2 this AM when using the bathroom noticed some bleeding. States she had a small gush and then saw pink when she wiped. Continued to see it when she wipes. Also having some left sided cramping.   Onset of complaint: today  Pain score: 4/10  Vitals:   05/25/20 1117  BP: 112/68  Pulse: 89  Resp: 16  Temp: 98.5 F (36.9 C)  SpO2: 100%     FHT:164  Lab orders placed from triage: UA

## 2020-05-27 LAB — GC/CHLAMYDIA PROBE AMP (~~LOC~~) NOT AT ARMC
Chlamydia: NEGATIVE
Comment: NEGATIVE
Comment: NORMAL
Neisseria Gonorrhea: NEGATIVE

## 2020-06-06 ENCOUNTER — Ambulatory Visit: Payer: Medicaid Other

## 2020-06-06 DIAGNOSIS — Z3490 Encounter for supervision of normal pregnancy, unspecified, unspecified trimester: Secondary | ICD-10-CM

## 2020-06-06 DIAGNOSIS — O0990 Supervision of high risk pregnancy, unspecified, unspecified trimester: Secondary | ICD-10-CM | POA: Insufficient documentation

## 2020-06-06 MED ORDER — BLOOD PRESSURE MONITOR KIT: 1.0000 | PACK | 0 refills | Status: AC

## 2020-06-06 NOTE — Progress Notes (Signed)
PRENATAL INTAKE SUMMARY  Veronica Green presents today New OB Nurse Interview.  OB History    Gravida  9   Para  5   Term  5   Preterm  0   AB  2   Living  5     SAB  0   IAB  1   Ectopic  0   Multiple  0   Live Births  5          I have reviewed the patient's medical, obstetrical, social, and family histories, medications, and available lab results.  SUBJECTIVE She has no unusual complaints  OBJECTIVE Initial Physical Exam (New OB)  GENERAL APPEARANCE: VIRTUAL    ASSESSMENT Normal pregnancy  PLAN Prenatal care B/P Cuff Sent  PHQ-9 = 0  OB Pnl/HIV  OB Urine Culture GC/CT HgbEval SMA CF A1C Glucose   If CHTN - P/C Ratio and CMP

## 2020-06-08 NOTE — L&D Delivery Note (Addendum)
Delivery Note:   Veronica Green [id number] at [redacted]w[redacted]d  Admitting diagnosis: Normal labor [O80, Z37.9] Risks:  Anthony multiparity Hypokalemia Hgb C trait THC use in pregnancy  First Stage:  Onset of labor: 11/28/20  Augmentation: AROM ROM: AROM 11/28/20 0631 Clear fluid  Active labor onset: 11/28/20 @ 0630 Analgesia /Anesthesia/Pain control intrapartum: None   Second Stage:  Complete dilation at 11/28/2020  @ 0835 Onset of pushing at 2040 FHR second stage Category II. Moderate variability, variables with contractions   SNM called to bedside, pt involuntarily bearing down. [place] Pushing in lithotomy position with SNM, CNM and L&D staff support at bedside. FOB "Thera Flake"  present for birth and supportive. With uterine contractions and maternal pushing efforts fetal head would progress to +3 station, then move back to 0. Station. [place] repositioned to right maternal tilt. With the following contraction fetal head delivered with ease.   Nuchal Cord: No  Delivery of a Live born female  Birth Weight:  3629g 8lbs.  APGAR: 9, 9  Newborn Delivery   Birth date/time: 11/28/2020 08:50:00 Delivery type: Vaginal, Spontaneous    Infant delivered in cephalic presentation, in [place] position and restituted to ROA position. SNM grasped posterior axilla and released arm in an overhead sweeping motion. Remaining fetal body delivered with ease. Vigorously crying Infant placed immediately skin to skin.   Cord double clamped after cessation of pulsation by SNM, cut by FOB [place]. .  Collection of cord blood for typing completed. Cord blood donation-None  Arterial cord blood sample-No    Third Stage:  Placenta delivered with gentle cord traction and LUS massage. Intact with 3 vessels . Uterine tone firm bleeding minimal  Uterotonics: IV Pitocin and IV TXA.  Placenta to L&D for disposal.   None  laceration identified.  Episiotomy:None  Local analgesia: n/a   Repair: perineum intact.  Est. Blood Loss  (HY):865.78   Complications: None   Mom to postpartum.  Baby girl " Nyeema" to Couplet care / Skin to Skin.  Delivery Report:  Review the Delivery Report for details.     Signed: Juliann Mule), BSN, RNC-OB, Student Nurse Midwife 11/28/2020, 9:30 AM   I was gloved and present for entire delivery SVD without incident No difficulty with shoulders  Mallie Snooks, CNM 11/28/20 1:43 PM

## 2020-06-14 ENCOUNTER — Other Ambulatory Visit: Payer: Self-pay

## 2020-06-14 ENCOUNTER — Encounter: Payer: Self-pay | Admitting: Obstetrics and Gynecology

## 2020-06-14 ENCOUNTER — Ambulatory Visit (INDEPENDENT_AMBULATORY_CARE_PROVIDER_SITE_OTHER): Payer: Medicaid Other | Admitting: Obstetrics and Gynecology

## 2020-06-14 ENCOUNTER — Other Ambulatory Visit (HOSPITAL_COMMUNITY)
Admission: RE | Admit: 2020-06-14 | Discharge: 2020-06-14 | Disposition: A | Discharge status: Home / Self Care | Payer: Medicaid Other | Source: Ambulatory Visit | Attending: Obstetrics and Gynecology | Admitting: Obstetrics and Gynecology

## 2020-06-14 DIAGNOSIS — Z3A13 13 weeks gestation of pregnancy: Secondary | ICD-10-CM

## 2020-06-14 DIAGNOSIS — O0990 Supervision of high risk pregnancy, unspecified, unspecified trimester: Secondary | ICD-10-CM | POA: Diagnosis not present

## 2020-06-14 DIAGNOSIS — Z3482 Encounter for supervision of other normal pregnancy, second trimester: Secondary | ICD-10-CM | POA: Diagnosis not present

## 2020-06-14 DIAGNOSIS — O09529 Supervision of elderly multigravida, unspecified trimester: Secondary | ICD-10-CM | POA: Insufficient documentation

## 2020-06-14 DIAGNOSIS — O09522 Supervision of elderly multigravida, second trimester: Secondary | ICD-10-CM

## 2020-06-14 MED ORDER — ASPIRIN EC 81 MG PO TBEC: 81.0000 mg | DELAYED_RELEASE_TABLET | Freq: Every day | ORAL | 2 refills | Status: DC

## 2020-06-14 MED ORDER — PREPLUS 27-1 MG PO TABS: 1.0000 | ORAL_TABLET | Freq: Every day | ORAL | 13 refills | Status: DC

## 2020-06-14 NOTE — Progress Notes (Signed)
Subjective:    Veronica Green is a [id number] [redacted]w[redacted]d being seen today for her first obstetrical visit.  Her obstetrical history is significant for advanced maternal age and history of molor pregnancy in 2020. Patient does intend to breast feed. Pregnancy history fully reviewed.  Patient reports headache.  Vitals:   06/14/20 0924  BP: 118/75  Pulse: 91  Weight: 106 lb 9.6 oz (48.4 kg)    HISTORY: OB History  Gravida Para Term Preterm AB Living  9 5 5  0 2 5  SAB IAB Ectopic Multiple Live Births  0 1 0 0 5    # Outcome Date GA Lbr Len/2nd Weight Sex Delivery Anes PTL Lv  9 Current           8 AB 10/11/18 [redacted]w[redacted]d         7 Term 01/09/17 [redacted]w[redacted]d 05:49 / 00:12 6 lb 7.7 oz (2.94 kg) M Vag-Spont None  LIV  6 Term 09/14/14 [redacted]w[redacted]d 12:30 / 00:14 7 lb 1.4 oz (3.215 kg) F Vag-Spont None  LIV  5 Term 05/06/11 [redacted]w[redacted]d 07:27 / 00:11 5 lb 14 oz (2.665 kg)  Vag-Spont None  LIV  4 Term 07/13/09     Vag-Spont  N LIV  3 IAB 2010 [redacted]w[redacted]d         2 Term 11/11/99     Vag-Spont  N LIV  1 Gravida            Past Medical History:  Diagnosis Date  . Anxiety   . Ascites   . Cannabis hyperemesis syndrome concurrent with and due to cannabis abuse ([place])   . Sickle cell trait ([place])   . Umbilical hernia    Past Surgical History:  Procedure Laterality Date  . DILATION AND CURETTAGE OF UTERUS    . DILATION AND EVACUATION N/A 04/17/2019   Procedure: SUCTION DILATATION AND EVACUATION;  Surgeon: Aletha Halim, MD;  Location: [place];  Service: Gynecology;  Laterality: N/A;  . OPERATIVE ULTRASOUND N/A 04/17/2019   Procedure: OPERATIVE ULTRASOUND;  Surgeon: Aletha Halim, MD;  Location: [place];  Service: Gynecology;  Laterality: N/A;  . RIGHT OOPHORECTOMY  07/2016   diagnostic laparoscopy converted to ex lap w/ umbilical hernia repair @ [facility] (benign)  . UMBILICAL HERNIA REPAIR  07/2016   [facility]   Family History  Problem Relation Age of Onset  . Cancer Maternal Grandmother   . Cancer Paternal Grandmother   . Asthma  Mother   . Multiple sclerosis Sister   . Anesthesia problems Neg Hx   . Hypotension Neg Hx   . Malignant hyperthermia Neg Hx   . Pseudochol deficiency Neg Hx      Exam    Uterus:  Fundal Height: 14 cm  Pelvic Exam:    Perineum: Normal Perineum   Vulva: normal   Vagina:  normal mucosa, normal discharge   pH:    Cervix: multiparous appearance and cervix is closed and long   Adnexa: no mass, fullness, tenderness   Bony Pelvis: gynecoid  System: Breast:  normal appearance, no masses or tenderness   Skin: normal coloration and turgor, no rashes    Neurologic: oriented, no focal deficits   Extremities: normal strength, tone, and muscle mass   HEENT extra ocular movement intact   Mouth/Teeth mucous membranes moist, pharynx normal without lesions and dental hygiene good   Neck supple and no masses   Cardiovascular: regular rate and rhythm   Respiratory:  appears well, vitals normal, no respiratory distress, acyanotic, normal  RR, chest clear, no wheezing, crepitations, rhonchi, normal symmetric air entry   Abdomen: soft, non-tender; bowel sounds normal; no masses,  no organomegaly   Urinary:       Assessment:    Pregnancy: [id number] Patient Active Problem List   Diagnosis Date Noted  . AMA (advanced maternal age) multigravida 35+ 06/14/2020  . Supervision of high risk pregnancy, antepartum 06/06/2020  . COVID-19 05/15/2019  . Abdominal pain 05/14/2019  . Nausea & vomiting 05/14/2019  . Hypokalemia 05/14/2019  . QT prolongation 05/14/2019  . 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 Abnormal pregnancy 04/16/2019  . Unwanted fertility 08/11/2016        Plan:     Initial labs drawn. Prenatal vitamins. Problem list reviewed and updated. Genetic Screening discussed : Panorama ordered.  Ultrasound discussed; fetal survey: ordered. Rx ASA provided Patient is considering BTL for contraception  Follow up in 4 weeks. 50% of 30 min visit spent on counseling and coordination of care.      [place]aclara Acklin 06/14/2020

## 2020-06-14 NOTE — Patient Instructions (Signed)
 Second Trimester of Pregnancy The second trimester is from week 14 through week 27 (months 4 through 6). The second trimester is often a time when you feel your best. Your body has adjusted to being pregnant, and you begin to feel better physically. Usually, morning sickness has lessened or quit completely, you may have more energy, and you may have an increase in appetite. The second trimester is also a time when the fetus is growing rapidly. At the end of the sixth month, the fetus is about 9 inches long and weighs about 1 pounds. You will likely begin to feel the baby move (quickening) between 16 and 20 weeks of pregnancy. Body changes during your second trimester Your body continues to go through many changes during your second trimester. The changes vary from woman to woman.  Your weight will continue to increase. You will notice your lower abdomen bulging out.  You may begin to get stretch marks on your hips, abdomen, and breasts.  You may develop headaches that can be relieved by medicines. The medicines should be approved by your health care provider.  You may urinate more often because the fetus is pressing on your bladder.  You may develop or continue to have heartburn as a result of your pregnancy.  You may develop constipation because certain hormones are causing the muscles that push waste through your intestines to slow down.  You may develop hemorrhoids or swollen, bulging veins (varicose veins).  You may have back pain. This is caused by: ? Weight gain. ? Pregnancy hormones that are relaxing the joints in your pelvis. ? A shift in weight and the muscles that support your balance.  Your breasts will continue to grow and they will continue to become tender.  Your gums may bleed and may be sensitive to brushing and flossing.  Dark spots or blotches (chloasma, mask of pregnancy) may develop on your face. This will likely fade after the baby is born.  A dark line from  your belly button to the pubic area (linea nigra) may appear. This will likely fade after the baby is born.  You may have changes in your hair. These can include thickening of your hair, rapid growth, and changes in texture. Some women also have hair loss during or after pregnancy, or hair that feels dry or thin. Your hair will most likely return to normal after your baby is born. What to expect at prenatal visits During a routine prenatal visit:  You will be weighed to make sure you and the fetus are growing normally.  Your blood pressure will be taken.  Your abdomen will be measured to track your baby's growth.  The fetal heartbeat will be listened to.  Any test results from the previous visit will be discussed. Your health care provider may ask you:  How you are feeling.  If you are feeling the baby move.  If you have had any abnormal symptoms, such as leaking fluid, bleeding, severe headaches, or abdominal cramping.  If you are using any tobacco products, including cigarettes, chewing tobacco, and electronic cigarettes.  If you have any questions. Other tests that may be performed during your second trimester include:  Blood tests that check for: ? Low iron levels (anemia). ? High blood sugar that affects pregnant women (gestational diabetes) between 24 and 28 weeks. ? Rh antibodies. This is to check for a protein on red blood cells (Rh factor).  Urine tests to check for infections, diabetes, or protein in   the urine.  An ultrasound to confirm the proper growth and development of the baby.  An amniocentesis to check for possible genetic problems.  Fetal screens for spina bifida and Down syndrome.  HIV (human immunodeficiency virus) testing. Routine prenatal testing includes screening for HIV, unless you choose not to have this test. Follow these instructions at home: Medicines  Follow your health care provider's instructions regarding medicine use. Specific medicines  may be either safe or unsafe to take during pregnancy.  Take a prenatal vitamin that contains at least 600 micrograms (mcg) of folic acid.  If you develop constipation, try taking a stool softener if your health care provider approves. Eating and drinking   Eat a balanced diet that includes fresh fruits and vegetables, whole grains, good sources of protein such as meat, eggs, or tofu, and low-fat dairy. Your health care provider will help you determine the amount of weight gain that is right for you.  Avoid raw meat and uncooked cheese. These carry germs that can cause birth defects in the baby.  If you have low calcium intake from food, talk to your health care provider about whether you should take a daily calcium supplement.  Limit foods that are high in fat and processed sugars, such as fried and sweet foods.  To prevent constipation: ? Drink enough fluid to keep your urine clear or pale yellow. ? Eat foods that are high in fiber, such as fresh fruits and vegetables, whole grains, and beans. Activity  Exercise only as directed by your health care provider. Most women can continue their usual exercise routine during pregnancy. Try to exercise for 30 minutes at least 5 days a week. Stop exercising if you experience uterine contractions.  Avoid heavy lifting, wear low heel shoes, and practice good posture.  A sexual relationship may be continued unless your health care provider directs you otherwise. Relieving pain and discomfort  Wear a good support bra to prevent discomfort from breast tenderness.  Take warm sitz baths to soothe any pain or discomfort caused by hemorrhoids. Use hemorrhoid cream if your health care provider approves.  Rest with your legs elevated if you have leg cramps or low back pain.  If you develop varicose veins, wear support hose. Elevate your feet for 15 minutes, 3-4 times a day. Limit salt in your diet. Prenatal Care  Write down your questions. Take  them to your prenatal visits.  Keep all your prenatal visits as told by your health care provider. This is important. Safety  Wear your seat belt at all times when driving.  Make a list of emergency phone numbers, including numbers for family, friends, the hospital, and police and fire departments. General instructions  Ask your health care provider for a referral to a local prenatal education class. Begin classes no later than the beginning of month 6 of your pregnancy.  Ask for help if you have counseling or nutritional needs during pregnancy. Your health care provider can offer advice or refer you to specialists for help with various needs.  Do not use hot tubs, steam rooms, or saunas.  Do not douche or use tampons or scented sanitary pads.  Do not cross your legs for long periods of time.  Avoid cat litter boxes and soil used by cats. These carry germs that can cause birth defects in the baby and possibly loss of the fetus by miscarriage or stillbirth.  Avoid all smoking, herbs, alcohol, and unprescribed drugs. Chemicals in these products can affect the   formation and growth of the baby.  Do not use any products that contain nicotine or tobacco, such as cigarettes and e-cigarettes. If you need help quitting, ask your health care provider.  Visit your dentist if you have not gone yet during your pregnancy. Use a soft toothbrush to brush your teeth and be gentle when you floss. Contact a health care provider if:  You have dizziness.  You have mild pelvic cramps, pelvic pressure, or nagging pain in the abdominal area.  You have persistent nausea, vomiting, or diarrhea.  You have a bad smelling vaginal discharge.  You have pain when you urinate. Get help right away if:  You have a fever.  You are leaking fluid from your vagina.  You have spotting or bleeding from your vagina.  You have severe abdominal cramping or pain.  You have rapid weight gain or weight loss.  You  have shortness of breath with chest pain.  You notice sudden or extreme swelling of your face, hands, ankles, feet, or legs.  You have not felt your baby move in over an hour.  You have severe headaches that do not go away when you take medicine.  You have vision changes. Summary  The second trimester is from week 14 through week 27 (months 4 through 6). It is also a time when the fetus is growing rapidly.  Your body goes through many changes during pregnancy. The changes vary from woman to woman.  Avoid all smoking, herbs, alcohol, and unprescribed drugs. These chemicals affect the formation and growth your baby.  Do not use any tobacco products, such as cigarettes, chewing tobacco, and e-cigarettes. If you need help quitting, ask your health care provider.  Contact your health care provider if you have any questions. Keep all prenatal visits as told by your health care provider. This is important. This information is not intended to replace advice given to you by your health care provider. Make sure you discuss any questions you have with your health care provider. Document Revised: 09/16/2018 Document Reviewed: 06/30/2016 [employer] Patient Education  2020 [employer]   Contraception Choices Contraception, also called birth control, refers to methods or devices that prevent pregnancy. Hormonal methods Contraceptive implant  A contraceptive implant is a thin, plastic tube that contains a hormone. It is inserted into the upper part of the arm. It can remain in place for up to 3 years. Progestin-only injections Progestin-only injections are injections of progestin, a synthetic form of the hormone progesterone. They are given every 3 months by a health care provider. Birth control pills  Birth control pills are pills that contain hormones that prevent pregnancy. They must be taken once a day, preferably at the same time each day. Birth control patch  The birth control patch  contains hormones that prevent pregnancy. It is placed on the skin and must be changed once a week for three weeks and removed on the fourth week. A prescription is needed to use this method of contraception. Vaginal ring  A vaginal ring contains hormones that prevent pregnancy. It is placed in the vagina for three weeks and removed on the fourth week. After that, the process is repeated with a new ring. A prescription is needed to use this method of contraception. Emergency contraceptive Emergency contraceptives prevent pregnancy after unprotected sex. They come in pill form and can be taken up to 5 days after sex. They work best the sooner they are taken after having sex. Most emergency contraceptives are available   without a prescription. This method should not be used as your only form of birth control. Barrier methods Female condom  A female condom is a thin sheath that is worn over the penis during sex. Condoms keep sperm from going inside a woman's body. They can be used with a spermicide to increase their effectiveness. They should be disposed after a single use. Female condom  A female condom is a soft, loose-fitting sheath that is put into the vagina before sex. The condom keeps sperm from going inside a woman's body. They should be disposed after a single use. Diaphragm  A diaphragm is a soft, dome-shaped barrier. It is inserted into the vagina before sex, along with a spermicide. The diaphragm blocks sperm from entering the uterus, and the spermicide kills sperm. A diaphragm should be left in the vagina for 6-8 hours after sex and removed within 24 hours. A diaphragm is prescribed and fitted by a health care provider. A diaphragm should be replaced every 1-2 years, after giving birth, after gaining more than 15 lb (6.8 kg), and after pelvic surgery. Cervical cap  A cervical cap is a round, soft latex or plastic cup that fits over the cervix. It is inserted into the vagina before sex, along  with spermicide. It blocks sperm from entering the uterus. The cap should be left in place for 6-8 hours after sex and removed within 48 hours. A cervical cap must be prescribed and fitted by a health care provider. It should be replaced every 2 years. Sponge  A sponge is a soft, circular piece of polyurethane foam with spermicide on it. The sponge helps block sperm from entering the uterus, and the spermicide kills sperm. To use it, you make it wet and then insert it into the vagina. It should be inserted before sex, left in for at least 6 hours after sex, and removed and thrown away within 30 hours. Spermicides Spermicides are chemicals that kill or block sperm from entering the cervix and uterus. They can come as a cream, jelly, suppository, foam, or tablet. A spermicide should be inserted into the vagina with an applicator at least 10-15 minutes before sex to allow time for it to work. The process must be repeated every time you have sex. Spermicides do not require a prescription. Intrauterine contraception Intrauterine device (IUD) An IUD is a T-shaped device that is put in a woman's uterus. There are two types:  Hormone IUD.This type contains progestin, a synthetic form of the hormone progesterone. This type can stay in place for 3-5 years.  Copper IUD.This type is wrapped in copper wire. It can stay in place for 10 years.  Permanent methods of contraception Female tubal ligation In this method, a woman's fallopian tubes are sealed, tied, or blocked during surgery to prevent eggs from traveling to the uterus. Hysteroscopic sterilization In this method, a small, flexible insert is placed into each fallopian tube. The inserts cause scar tissue to form in the fallopian tubes and block them, so sperm cannot reach an egg. The procedure takes about 3 months to be effective. Another form of birth control must be used during those 3 months. Female sterilization This is a procedure to tie off the  tubes that carry sperm (vasectomy). After the procedure, the man can still ejaculate fluid (semen). Natural planning methods Natural family planning In this method, a couple does not have sex on days when the woman could become pregnant. Calendar method This means keeping track of the length   of each menstrual cycle, identifying the days when pregnancy can happen, and not having sex on those days. Ovulation method In this method, a couple avoids sex during ovulation. Symptothermal method This method involves not having sex during ovulation. The woman typically checks for ovulation by watching changes in her temperature and in the consistency of cervical mucus. Post-ovulation method In this method, a couple waits to have sex until after ovulation. Summary  Contraception, also called birth control, means methods or devices that prevent pregnancy.  Hormonal methods of contraception include implants, injections, pills, patches, vaginal rings, and emergency contraceptives.  Barrier methods of contraception can include female condoms, female condoms, diaphragms, cervical caps, sponges, and spermicides.  There are two types of IUDs (intrauterine devices). An IUD can be put in a woman's uterus to prevent pregnancy for 3-5 years.  Permanent sterilization can be done through a procedure for males, females, or both.  Natural family planning methods involve not having sex on days when the woman could become pregnant. This information is not intended to replace advice given to you by your health care provider. Make sure you discuss any questions you have with your health care provider. Document Revised: 05/27/2017 Document Reviewed: 06/27/2016 [employer] Patient Education  2020 [employer]   Breastfeeding  Choosing to breastfeed is one of the best decisions you can make for yourself and your baby. A change in hormones during pregnancy causes your breasts to make breast milk in your milk-producing  glands. Hormones prevent breast milk from being released before your baby is born. They also prompt milk flow after birth. Once breastfeeding has begun, thoughts of your baby, as well as his or her sucking or crying, can stimulate the release of milk from your milk-producing glands. Benefits of breastfeeding Research shows that breastfeeding offers many health benefits for infants and mothers. It also offers a cost-free and convenient way to feed your baby. For your baby  Your first milk (colostrum) helps your baby's digestive system to function better.  Special cells in your milk (antibodies) help your baby to fight off infections.  Breastfed babies are less likely to develop asthma, allergies, obesity, or type 2 diabetes. They are also at lower risk for sudden infant death syndrome (SIDS).  Nutrients in breast milk are better able to meet your baby's needs compared to infant formula.  Breast milk improves your baby's brain development. For you  Breastfeeding helps to create a very special bond between you and your baby.  Breastfeeding is convenient. Breast milk costs nothing and is always available at the correct temperature.  Breastfeeding helps to burn calories. It helps you to lose the weight that you gained during pregnancy.  Breastfeeding makes your uterus return faster to its size before pregnancy. It also slows bleeding (lochia) after you give birth.  Breastfeeding helps to lower your risk of developing type 2 diabetes, osteoporosis, rheumatoid arthritis, cardiovascular disease, and breast, ovarian, uterine, and endometrial cancer later in life. Breastfeeding basics Starting breastfeeding  Find a comfortable place to sit or lie down, with your neck and back well-supported.  Place a pillow or a rolled-up blanket under your baby to bring him or her to the level of your breast (if you are seated). Nursing pillows are specially designed to help support your arms and your baby while  you breastfeed.  Make sure that your baby's tummy (abdomen) is facing your abdomen.  Gently massage your breast. With your fingertips, massage from the outer edges of your breast inward toward   the nipple. This encourages milk flow. If your milk flows slowly, you may need to continue this action during the feeding.  Support your breast with 4 fingers underneath and your thumb above your nipple (make the letter "C" with your hand). Make sure your fingers are well away from your nipple and your baby's mouth.  Stroke your baby's lips gently with your finger or nipple.  When your baby's mouth is open wide enough, quickly bring your baby to your breast, placing your entire nipple and as much of the areola as possible into your baby's mouth. The areola is the colored area around your nipple. ? More areola should be visible above your baby's upper lip than below the lower lip. ? Your baby's lips should be opened and extended outward (flanged) to ensure an adequate, comfortable latch. ? Your baby's tongue should be between his or her lower gum and your breast.  Make sure that your baby's mouth is correctly positioned around your nipple (latched). Your baby's lips should create a seal on your breast and be turned out (everted).  It is common for your baby to suck about 2-3 minutes in order to start the flow of breast milk. Latching Teaching your baby how to latch onto your breast properly is very important. An improper latch can cause nipple pain, decreased milk supply, and poor weight gain in your baby. Also, if your baby is not latched onto your nipple properly, he or she may swallow some air during feeding. This can make your baby fussy. Burping your baby when you switch breasts during the feeding can help to get rid of the air. However, teaching your baby to latch on properly is still the best way to prevent fussiness from swallowing air while breastfeeding. Signs that your baby has successfully  latched onto your nipple  Silent tugging or silent sucking, without causing you pain. Infant's lips should be extended outward (flanged).  Swallowing heard between every 3-4 sucks once your milk has started to flow (after your let-down milk reflex occurs).  Muscle movement above and in front of his or her ears while sucking. Signs that your baby has not successfully latched onto your nipple  Sucking sounds or smacking sounds from your baby while breastfeeding.  Nipple pain. If you think your baby has not latched on correctly, slip your finger into the corner of your baby's mouth to break the suction and place it between your baby's gums. Attempt to start breastfeeding again. Signs of successful breastfeeding Signs from your baby  Your baby will gradually decrease the number of sucks or will completely stop sucking.  Your baby will fall asleep.  Your baby's body will relax.  Your baby will retain a small amount of milk in his or her mouth.  Your baby will let go of your breast by himself or herself. Signs from you  Breasts that have increased in firmness, weight, and size 1-3 hours after feeding.  Breasts that are softer immediately after breastfeeding.  Increased milk volume, as well as a change in milk consistency and color by the fifth day of breastfeeding.  Nipples that are not sore, cracked, or bleeding. Signs that your baby is getting enough milk  Wetting at least 1-2 diapers during the first 24 hours after birth.  Wetting at least 5-6 diapers every 24 hours for the first week after birth. The urine should be clear or pale yellow by the age of 5 days.  Wetting 6-8 diapers every 24 hours as   your baby continues to grow and develop.  At least 3 stools in a 24-hour period by the age of 5 days. The stool should be soft and yellow.  At least 3 stools in a 24-hour period by the age of 7 days. The stool should be seedy and yellow.  No loss of weight greater than 10% of  birth weight during the first 3 days of life.  Average weight gain of 4-7 oz (113-198 g) per week after the age of 4 days.  Consistent daily weight gain by the age of 5 days, without weight loss after the age of 2 weeks. After a feeding, your baby may spit up a small amount of milk. This is normal. Breastfeeding frequency and duration Frequent feeding will help you make more milk and can prevent sore nipples and extremely full breasts (breast engorgement). Breastfeed when you feel the need to reduce the fullness of your breasts or when your baby shows signs of hunger. This is called "breastfeeding on demand." Signs that your baby is hungry include:  Increased alertness, activity, or restlessness.  Movement of the head from side to side.  Opening of the mouth when the corner of the mouth or cheek is stroked (rooting).  Increased sucking sounds, smacking lips, cooing, sighing, or squeaking.  Hand-to-mouth movements and sucking on fingers or hands.  Fussing or crying. Avoid introducing a pacifier to your baby in the first 4-6 weeks after your baby is born. After this time, you may choose to use a pacifier. Research has shown that pacifier use during the first year of a baby's life decreases the risk of sudden infant death syndrome (SIDS). Allow your baby to feed on each breast as long as he or she wants. When your baby unlatches or falls asleep while feeding from the first breast, offer the second breast. Because newborns are often sleepy in the first few weeks of life, you may need to awaken your baby to get him or her to feed. Breastfeeding times will vary from baby to baby. However, the following rules can serve as a guide to help you make sure that your baby is properly fed:  Newborns (babies 4 weeks of age or younger) may breastfeed every 1-3 hours.  Newborns should not go without breastfeeding for longer than 3 hours during the day or 5 hours during the night.  You should breastfeed  your baby a minimum of 8 times in a 24-hour period. Breast milk pumping     Pumping and storing breast milk allows you to make sure that your baby is exclusively fed your breast milk, even at times when you are unable to breastfeed. This is especially important if you go back to work while you are still breastfeeding, or if you are not able to be present during feedings. Your lactation consultant can help you find a method of pumping that works best for you and give you guidelines about how long it is safe to store breast milk. Caring for your breasts while you breastfeed Nipples can become dry, cracked, and sore while breastfeeding. The following recommendations can help keep your breasts moisturized and healthy:  Avoid using soap on your nipples.  Wear a supportive bra designed especially for nursing. Avoid wearing underwire-style bras or extremely tight bras (sports bras).  Air-dry your nipples for 3-4 minutes after each feeding.  Use only cotton bra pads to absorb leaked breast milk. Leaking of breast milk between feedings is normal.  Use lanolin on your nipples   after breastfeeding. Lanolin helps to maintain your skin's normal moisture barrier. Pure lanolin is not harmful (not toxic) to your baby. You may also hand express a few drops of breast milk and gently massage that milk into your nipples and allow the milk to air-dry. In the first few weeks after giving birth, some women experience breast engorgement. Engorgement can make your breasts feel heavy, warm, and tender to the touch. Engorgement peaks within 3-5 days after you give birth. The following recommendations can help to ease engorgement:  Completely empty your breasts while breastfeeding or pumping. You may want to start by applying warm, moist heat (in the shower or with warm, water-soaked hand towels) just before feeding or pumping. This increases circulation and helps the milk flow. If your baby does not completely empty your  breasts while breastfeeding, pump any extra milk after he or she is finished.  Apply ice packs to your breasts immediately after breastfeeding or pumping, unless this is too uncomfortable for you. To do this: ? Put ice in a plastic bag. ? Place a towel between your skin and the bag. ? Leave the ice on for 20 minutes, 2-3 times a day.  Make sure that your baby is latched on and positioned properly while breastfeeding. If engorgement persists after 48 hours of following these recommendations, contact your health care provider or a lactation consultant. Overall health care recommendations while breastfeeding  Eat 3 healthy meals and 3 snacks every day. Well-nourished mothers who are breastfeeding need an additional 450-500 calories a day. You can meet this requirement by increasing the amount of a balanced diet that you eat.  Drink enough water to keep your urine pale yellow or clear.  Rest often, relax, and continue to take your prenatal vitamins to prevent fatigue, stress, and low vitamin and mineral levels in your body (nutrient deficiencies).  Do not use any products that contain nicotine or tobacco, such as cigarettes and e-cigarettes. Your baby may be harmed by chemicals from cigarettes that pass into breast milk and exposure to secondhand smoke. If you need help quitting, ask your health care provider.  Avoid alcohol.  Do not use illegal drugs or marijuana.  Talk with your health care provider before taking any medicines. These include over-the-counter and prescription medicines as well as vitamins and herbal supplements. Some medicines that may be harmful to your baby can pass through breast milk.  It is possible to become pregnant while breastfeeding. If birth control is desired, ask your health care provider about options that will be safe while breastfeeding your baby. Where to find more information: [employer]: www.llli.org Contact a health care provider  if:  You feel like you want to stop breastfeeding or have become frustrated with breastfeeding.  Your nipples are cracked or bleeding.  Your breasts are red, tender, or warm.  You have: ? Painful breasts or nipples. ? A swollen area on either breast. ? A fever or chills. ? Nausea or vomiting. ? Drainage other than breast milk from your nipples.  Your breasts do not become full before feedings by the fifth day after you give birth.  You feel sad and depressed.  Your baby is: ? Too sleepy to eat well. ? Having trouble sleeping. ? More than 1 week old and wetting fewer than 6 diapers in a 24-hour period. ? Not gaining weight by 5 days of age.  Your baby has fewer than 3 stools in a 24-hour period.  Your baby's skin or   the white parts of his or her eyes become yellow. Get help right away if:  Your baby is overly tired (lethargic) and does not want to wake up and feed.  Your baby develops an unexplained fever. Summary  Breastfeeding offers many health benefits for infant and mothers.  Try to breastfeed your infant when he or she shows early signs of hunger.  Gently tickle or stroke your baby's lips with your finger or nipple to allow the baby to open his or her mouth. Bring the baby to your breast. Make sure that much of the areola is in your baby's mouth. Offer one side and burp the baby before you offer the other side.  Talk with your health care provider or lactation consultant if you have questions or you face problems as you breastfeed. This information is not intended to replace advice given to you by your health care provider. Make sure you discuss any questions you have with your health care provider. Document Revised: 08/19/2017 Document Reviewed: 06/26/2016 Elsevier Patient Education  2020 Elsevier Inc.  

## 2020-06-14 NOTE — Progress Notes (Signed)
NOB in office, intake interview completed on 06-06-20, U/S on 04-26-20. Pt reports ongoing headaches, and feeling a little bit of flutter movements.

## 2020-06-15 LAB — CBC/D/PLT+RPR+RH+ABO+RUB AB...
Antibody Screen: NEGATIVE
Basophils Absolute: 0 10*3/uL (ref 0.0–0.2)
Basos: 0 %
EOS (ABSOLUTE): 0.1 10*3/uL (ref 0.0–0.4)
Eos: 1 %
HCV Ab: <0.1 s/co ratio (ref 0.0–0.9)
HIV Screen 4th Generation wRfx: NONREACTIVE
Hematocrit: 38.1 % (ref 34.0–46.6)
Hemoglobin: 12.9 g/dL (ref 11.1–15.9)
Hepatitis B Surface Ag: NEGATIVE
Immature Grans (Abs): 0.1 10*3/uL (ref 0.0–0.1)
Immature Granulocytes: 1 %
Lymphocytes Absolute: 2.1 10*3/uL (ref 0.7–3.1)
Lymphs: 19 %
MCH: 32.4 pg (ref 26.6–33.0)
MCHC: 33.9 g/dL (ref 31.5–35.7)
MCV: 96 fL (ref 79–97)
Monocytes Absolute: 0.5 10*3/uL (ref 0.1–0.9)
Monocytes: 4 %
Neutrophils Absolute: 8.3 10*3/uL — ABNORMAL HIGH (ref 1.4–7.0)
Neutrophils: 75 %
Platelets: 316 10*3/uL (ref 150–450)
RBC: 3.98 x10E6/uL (ref 3.77–5.28)
RDW: 13.2 % (ref 11.7–15.4)
RPR Ser Ql: NONREACTIVE
Rh Factor: POSITIVE
Rubella Antibodies, IGG: 3.9 index (ref >0.99)
WBC: 11 10*3/uL — ABNORMAL HIGH (ref 3.4–10.8)

## 2020-06-15 LAB — HCV INTERPRETATION

## 2020-06-16 LAB — CULTURE, OB URINE

## 2020-06-16 LAB — URINE CULTURE, OB REFLEX

## 2020-06-17 LAB — CERVICOVAGINAL ANCILLARY ONLY
Bacterial Vaginitis (gardnerella): NEGATIVE
Candida Glabrata: NEGATIVE
Candida Vaginitis: NEGATIVE
Chlamydia: NEGATIVE
Comment: NEGATIVE
Comment: NEGATIVE
Comment: NEGATIVE
Comment: NEGATIVE
Comment: NEGATIVE
Comment: NORMAL
Neisseria Gonorrhea: NEGATIVE
Trichomonas: NEGATIVE

## 2020-06-19 LAB — CYTOLOGY - PAP
Comment: NEGATIVE
Diagnosis: NEGATIVE
High risk HPV: NEGATIVE

## 2020-06-21 ENCOUNTER — Encounter: Payer: Self-pay | Admitting: Obstetrics and Gynecology

## 2020-06-26 ENCOUNTER — Encounter: Payer: Self-pay | Admitting: Obstetrics and Gynecology

## 2020-07-12 ENCOUNTER — Other Ambulatory Visit: Payer: Self-pay

## 2020-07-12 ENCOUNTER — Encounter: Payer: Self-pay | Admitting: Nurse Practitioner

## 2020-07-12 ENCOUNTER — Ambulatory Visit (INDEPENDENT_AMBULATORY_CARE_PROVIDER_SITE_OTHER): Payer: Medicaid Other | Admitting: Nurse Practitioner

## 2020-07-12 VITALS — BP 91/60 | HR 86 | Wt 120.4 lb

## 2020-07-12 DIAGNOSIS — Z3A17 17 weeks gestation of pregnancy: Secondary | ICD-10-CM

## 2020-07-12 DIAGNOSIS — O0990 Supervision of high risk pregnancy, unspecified, unspecified trimester: Secondary | ICD-10-CM

## 2020-07-12 DIAGNOSIS — Z3009 Encounter for other general counseling and advice on contraception: Secondary | ICD-10-CM

## 2020-07-12 MED ORDER — PRENATAL 19 29-1 MG PO TABS: 1.0000 | ORAL_TABLET | Freq: Every day | ORAL | 11 refills | Status: DC

## 2020-07-12 NOTE — Progress Notes (Signed)
Declined AFP

## 2020-07-12 NOTE — Progress Notes (Addendum)
    Subjective:  Veronica Green is a 40 y.o. [id number] at [redacted]w[redacted]d being seen today for ongoing prenatal care.  She is currently monitored for the following issues for this high-risk pregnancy and has Unwanted fertility; Molar pregnancy; Hypokalemia; QT prolongation; COVID-19; Supervision of high risk pregnancy, antepartum; and AMA (advanced maternal age) multigravida 35+ on their problem list.  Patient reports no complaints.  Contractions: Not present. Vag. Bleeding: None.  Movement: Present. Denies leaking of fluid.   The following portions of the patient's history were reviewed and updated as appropriate: allergies, current medications, past family history, past medical history, past social history, past surgical history and problem list. Problem list updated.  Objective:   Vitals:   07/12/20 0914  BP: 91/60  Pulse: 86  Weight: 120 lb 6.4 oz (54.6 kg)    Fetal Status: Fetal Heart Rate (bpm): 145 Fundal Height: 22 cm Movement: Present     General:  Alert, oriented and cooperative. Patient is in no acute distress.  Skin: Skin is warm and dry. No rash noted.   Cardiovascular: Normal heart rate noted  Respiratory: Normal respiratory effort, no problems with respiration noted  Abdomen: Soft, gravid, appropriate for gestational age. Pain/Pressure: Absent     Pelvic:  Cervical exam deferred        Extremities: Normal range of motion.  Edema: None  Mental Status: Normal mood and affect. Normal behavior. Normal judgment and thought content.   Urinalysis:      Assessment and Plan:  Pregnancy: [id number] at [redacted]w[redacted]d  1. Supervision of high risk pregnancy, antepartum Does not have BP cuff - insurance did not cover Prescribed PNV - not taking currently as insurance did not cover - talked with DSS and insurance should cover her vitamins and BP cuff Reviewed taking low dose aspirin - advised Medicaid will not cover and she will need to purchase. Reviewed scheduled [place] later in February - reviewed  location as [facility]  2. Unwanted fertility Wants BTL - understands it is permanent - will sign papers at next vieit    Preterm labor symptoms and general obstetric precautions including but not limited to vaginal bleeding, contractions, leaking of fluid and fetal movement were reviewed in detail with the patient. Please refer to After Visit Summary for other counseling recommendations.  Return in about 4 weeks (around 08/09/2020) for ROB in person.  Earlie Server, RN, MSN, NP-BC Nurse Practitioner, [facility] for [employer], [place] [facility] 07/12/2020 9:35 AM

## 2020-07-26 ENCOUNTER — Ambulatory Visit: Payer: Medicaid Other | Attending: Obstetrics and Gynecology

## 2020-07-26 ENCOUNTER — Ambulatory Visit: Payer: Medicaid Other | Admitting: *Deleted

## 2020-07-26 ENCOUNTER — Other Ambulatory Visit: Payer: Self-pay | Admitting: *Deleted

## 2020-07-26 ENCOUNTER — Encounter: Payer: Self-pay | Admitting: *Deleted

## 2020-07-26 ENCOUNTER — Other Ambulatory Visit: Payer: Self-pay

## 2020-07-26 DIAGNOSIS — O0990 Supervision of high risk pregnancy, unspecified, unspecified trimester: Secondary | ICD-10-CM

## 2020-07-26 DIAGNOSIS — Z362 Encounter for other antenatal screening follow-up: Secondary | ICD-10-CM

## 2020-08-08 ENCOUNTER — Encounter: Payer: Self-pay | Admitting: Nurse Practitioner

## 2020-08-08 DIAGNOSIS — D582 Other hemoglobinopathies: Secondary | ICD-10-CM | POA: Insufficient documentation

## 2020-08-09 ENCOUNTER — Other Ambulatory Visit: Payer: Self-pay

## 2020-08-09 ENCOUNTER — Encounter: Payer: Self-pay | Admitting: Nurse Practitioner

## 2020-08-09 ENCOUNTER — Ambulatory Visit (INDEPENDENT_AMBULATORY_CARE_PROVIDER_SITE_OTHER): Payer: Medicaid Other | Admitting: Nurse Practitioner

## 2020-08-09 VITALS — BP 99/64 | HR 91 | Wt 124.8 lb

## 2020-08-09 DIAGNOSIS — D582 Other hemoglobinopathies: Secondary | ICD-10-CM

## 2020-08-09 DIAGNOSIS — O0990 Supervision of high risk pregnancy, unspecified, unspecified trimester: Secondary | ICD-10-CM

## 2020-08-09 DIAGNOSIS — O09522 Supervision of elderly multigravida, second trimester: Secondary | ICD-10-CM

## 2020-08-09 DIAGNOSIS — Z3A21 21 weeks gestation of pregnancy: Secondary | ICD-10-CM

## 2020-08-09 NOTE — Progress Notes (Signed)
    Subjective:  Veronica Green is a 40 y.o. K3T4656 at [redacted]w[redacted]d being seen today for ongoing prenatal care.  She is currently monitored for the following issues for this high-risk pregnancy and has Unwanted fertility; Molar pregnancy; Hypokalemia; QT prolongation; COVID-19; Supervision of high risk pregnancy, antepartum; AMA (advanced maternal age) multigravida 35+; and Hemoglobin C trait ([place]) on their problem list.  Patient reports some pressure in vaginal and lower pelvic area - states it is normal but she is feeling it.  Contractions: Not present. Vag. Bleeding: None.  Movement: Present. Denies leaking of fluid.   The following portions of the patient's history were reviewed and updated as appropriate: allergies, current medications, past family history, past medical history, past social history, past surgical history and problem list. Problem list updated.  Objective:   Vitals:   08/09/20 0910  BP: 99/64  Pulse: 91  Weight: 124 lb 12.8 oz (56.6 kg)    Fetal Status: Fetal Heart Rate (bpm): 155   Movement: Present     General:  Alert, oriented and cooperative. Patient is in no acute distress.  Skin: Skin is warm and dry. No rash noted.   Cardiovascular: Normal heart rate noted  Respiratory: Normal respiratory effort, no problems with respiration noted  Abdomen: Soft, gravid, appropriate for gestational age. Pain/Pressure: Present     Pelvic:  Cervical exam deferred        Extremities: Normal range of motion.  Edema: None  Mental Status: Normal mood and affect. Normal behavior. Normal judgment and thought content.   Urinalysis:      Assessment and Plan:  Pregnancy: [id number] at [redacted]w[redacted]d  1. Supervision of high risk pregnancy, antepartum Does not have BP cuff - insurance has not covered Taking baby aspirin Next [place] 08-23-20   2. Multigravida of advanced maternal age in second trimester Considering BTL or LARC - will sign papers for BTL in next visits.  Advised that signing papers will  complete paperwork and she can still decide for sure if she wants a tubal  3.  HBG C trait  Client thinks she has sickle cell trait, but HGB S did not show in her testing.  There was a note in her [place] report about talking with her re: genetic testing but client does not recall any conversation.  She has 5 children with her current partner who do not have problems.  This is the same father of this baby.  She declines genetic counseling.  Preterm labor symptoms and general obstetric precautions including but not limited to vaginal bleeding, contractions, leaking of fluid and fetal movement were reviewed in detail with the patient. Please refer to After Visit Summary for other counseling recommendations.  Return in about 4 weeks (around 09/06/2020) for in person ROB.  Earlie Server, RN, MSN, NP-BC Nurse Practitioner, [facility] for [employer], [place] 08/09/2020 9:31 AM

## 2020-08-09 NOTE — Progress Notes (Signed)
Patient presents for ROB. Patient has no concerns.

## 2020-08-23 ENCOUNTER — Ambulatory Visit: Payer: Medicaid Other

## 2020-08-23 ENCOUNTER — Encounter: Payer: Self-pay | Admitting: *Deleted

## 2020-08-23 ENCOUNTER — Ambulatory Visit: Payer: Medicaid Other | Attending: Obstetrics and Gynecology

## 2020-08-23 ENCOUNTER — Other Ambulatory Visit: Payer: Self-pay

## 2020-08-23 ENCOUNTER — Ambulatory Visit: Payer: Medicaid Other | Admitting: *Deleted

## 2020-08-23 DIAGNOSIS — Z362 Encounter for other antenatal screening follow-up: Secondary | ICD-10-CM

## 2020-08-23 DIAGNOSIS — O0990 Supervision of high risk pregnancy, unspecified, unspecified trimester: Secondary | ICD-10-CM | POA: Insufficient documentation

## 2020-08-23 DIAGNOSIS — O09522 Supervision of elderly multigravida, second trimester: Secondary | ICD-10-CM | POA: Insufficient documentation

## 2020-08-26 ENCOUNTER — Other Ambulatory Visit: Payer: Self-pay | Admitting: *Deleted

## 2020-08-26 DIAGNOSIS — O09522 Supervision of elderly multigravida, second trimester: Secondary | ICD-10-CM

## 2020-09-06 ENCOUNTER — Encounter: Payer: Medicaid Other | Admitting: Nurse Practitioner

## 2020-09-23 ENCOUNTER — Encounter: Payer: Medicaid Other | Admitting: Family Medicine

## 2020-10-08 ENCOUNTER — Other Ambulatory Visit: Payer: Self-pay

## 2020-10-08 ENCOUNTER — Ambulatory Visit (INDEPENDENT_AMBULATORY_CARE_PROVIDER_SITE_OTHER): Payer: Medicaid Other | Admitting: Obstetrics and Gynecology

## 2020-10-08 ENCOUNTER — Encounter: Payer: Self-pay | Admitting: Obstetrics and Gynecology

## 2020-10-08 VITALS — BP 105/67 | HR 86 | Wt 136.0 lb

## 2020-10-08 DIAGNOSIS — O09523 Supervision of elderly multigravida, third trimester: Secondary | ICD-10-CM

## 2020-10-08 DIAGNOSIS — O0990 Supervision of high risk pregnancy, unspecified, unspecified trimester: Secondary | ICD-10-CM

## 2020-10-08 DIAGNOSIS — Z3A30 30 weeks gestation of pregnancy: Secondary | ICD-10-CM

## 2020-10-08 DIAGNOSIS — D582 Other hemoglobinopathies: Secondary | ICD-10-CM

## 2020-10-08 NOTE — Progress Notes (Signed)
   PRENATAL VISIT NOTE  Subjective:  Veronica Green is a 40 y.o. [id number] at [redacted]w[redacted]d being seen today for ongoing prenatal care.  She is currently monitored for the following issues for this high-risk pregnancy and has Unwanted fertility; Molar pregnancy; Hypokalemia; QT prolongation; COVID-19; Supervision of high risk pregnancy, antepartum; AMA (advanced maternal age) multigravida 35+; Hemoglobin C trait ([place]); and [redacted] weeks gestation of pregnancy on their problem list.  Patient doing well with no acute concerns today. She reports no complaints.  Contractions: Irritability. Vag. Bleeding: None.  Movement: Present. Denies leaking of fluid.   Pt is unsure about BTL, will choose nexplanon if she does not go through with procedure.  Did discuss medicaid form needs to be signed if she wishes to go forward.  Pt has missed a month of visits, 28 week labs rescheduled.  The following portions of the patient's history were reviewed and updated as appropriate: allergies, current medications, past family history, past medical history, past social history, past surgical history and problem list. Problem list updated.  Objective:   Vitals:   10/08/20 1522  BP: 105/67  Pulse: 86  Weight: 136 lb (61.7 kg)    Fetal Status: Fetal Heart Rate (bpm): 145 Fundal Height: 31 cm Movement: Present     General:  Alert, oriented and cooperative. Patient is in no acute distress.  Skin: Skin is warm and dry. No rash noted.   Cardiovascular: Normal heart rate noted  Respiratory: Normal respiratory effort, no problems with respiration noted  Abdomen: Soft, gravid, appropriate for gestational age.  Pain/Pressure: Present     Pelvic: Cervical exam deferred        Extremities: Normal range of motion.  Edema: None  Mental Status:  Normal mood and affect. Normal behavior. Normal judgment and thought content.   Assessment and Plan:  Pregnancy: [id number] at [redacted]w[redacted]d  1. Supervision of high risk pregnancy, antepartum Third  trimester labs in 1 week, lab only  ? Polyhydramnios during exam, pt has u/s scheduled on 10/25/20 for AMA, will eval then.  - Glucose Tolerance, 2 Hours w/1 Hour; Future - CBC; Future - RPR; Future - HIV Antibody (routine testing w rflx); Future  2. Hemoglobin C trait ([place])   3. Multigravida of advanced maternal age in third trimester   4. [redacted] weeks gestation of pregnancy   Preterm labor symptoms and general obstetric precautions including but not limited to vaginal bleeding, contractions, leaking of fluid and fetal movement were reviewed in detail with the patient.  Please refer to After Visit Summary for other counseling recommendations.   Return in about 2 weeks (around 10/22/2020) for ROB, in person.   Lynnda Shields, MD Faculty Attending Center for [facility]

## 2020-10-08 NOTE — Progress Notes (Signed)
ROB 30w  Last seen 08/09/20.has not had 2 hr GTT yet. Pt notes not taking baby asprin at this time.   CC: pt states yesterday she noticed pinkish discharge none today. Denies any recent intercourse.

## 2020-10-16 ENCOUNTER — Other Ambulatory Visit: Payer: Self-pay

## 2020-10-16 ENCOUNTER — Other Ambulatory Visit: Payer: Medicaid Other

## 2020-10-16 DIAGNOSIS — O0990 Supervision of high risk pregnancy, unspecified, unspecified trimester: Secondary | ICD-10-CM

## 2020-10-21 LAB — RPR: RPR Ser Ql: NONREACTIVE

## 2020-10-21 LAB — CBC
Hematocrit: 33.2 % — ABNORMAL LOW (ref 34.0–46.6)
Hemoglobin: 11.5 g/dL (ref 11.1–15.9)
MCH: 31.8 pg (ref 26.6–33.0)
MCHC: 34.6 g/dL (ref 31.5–35.7)
MCV: 92 fL (ref 79–97)
Platelets: 247 10*3/uL (ref 150–450)
RBC: 3.62 x10E6/uL — ABNORMAL LOW (ref 3.77–5.28)
RDW: 13.1 % (ref 11.7–15.4)
WBC: 8.3 10*3/uL (ref 3.4–10.8)

## 2020-10-21 LAB — GLUCOSE TOLERANCE, 2 HOURS W/ 1HR
Glucose, 1 hour: 137 mg/dL (ref 65–179)
Glucose, 2 hour: 61 mg/dL — ABNORMAL LOW (ref 65–152)
Glucose, Fasting: 88 mg/dL (ref 65–91)

## 2020-10-21 LAB — HIV ANTIBODY (ROUTINE TESTING W REFLEX): HIV Screen 4th Generation wRfx: NONREACTIVE

## 2020-10-22 ENCOUNTER — Encounter: Payer: Medicaid Other | Admitting: Obstetrics and Gynecology

## 2020-10-24 ENCOUNTER — Other Ambulatory Visit: Payer: Self-pay

## 2020-10-24 ENCOUNTER — Ambulatory Visit (INDEPENDENT_AMBULATORY_CARE_PROVIDER_SITE_OTHER): Payer: Medicaid Other

## 2020-10-24 VITALS — BP 99/67 | HR 82 | Wt 136.0 lb

## 2020-10-24 DIAGNOSIS — Z3A32 32 weeks gestation of pregnancy: Secondary | ICD-10-CM

## 2020-10-24 DIAGNOSIS — O0990 Supervision of high risk pregnancy, unspecified, unspecified trimester: Secondary | ICD-10-CM

## 2020-10-24 NOTE — Progress Notes (Signed)
   [facility] PREGNANCY OFFICE VISIT  Patient name: Veronica Green MRN [id number]  Date of birth: 17-May-1981 Chief Complaint:   Routine Prenatal Visit  Subjective:   Veronica Green is a 40 y.o. [id number] female at [redacted]w[redacted]d with an Estimated Date of Delivery: 12/17/20 being seen today for ongoing management of a high-risk pregnancy aeb has Unwanted fertility; Molar pregnancy; Hypokalemia; QT prolongation; COVID-19; Supervision of high risk pregnancy, antepartum; AMA (advanced maternal age) multigravida 35+; Hemoglobin C trait ([place]); and [redacted] weeks gestation of pregnancy on their problem list.  Patient presents today with no complaints.  Patient endorses fetal movement. Patient denies abdominal cramping or contractions, but reports some "braxton hicks feelings."  Patient denies vaginal concerns including abnormal discharge, leaking of fluid, and bleeding.  Contractions: Irregular. Vag. Bleeding: None.  Movement: Present.  Patient states she is likely to use Nexplanon for postpartum birth control.   Reviewed past medical,surgical, social, obstetrical and family history as well as problem list, medications and allergies.  Objective   Vitals:   10/24/20 0913  BP: 99/67  Pulse: 82  Weight: 136 lb (61.7 kg)  Body mass index is 23.34 kg/m.  Total Weight Gain:21 lb (9.526 kg)         Physical Examination:   General appearance: Well appearing, and in no distress  Mental status: Alert, oriented to person, place, and time  Skin: Warm & dry  Cardiovascular: Normal heart rate noted  Respiratory: Normal respiratory effort, no distress  Abdomen: Soft, gravid, nontender, AGA with Fundal Height: 32 cm  Pelvic: Cervical exam deferred           Extremities:    Fetal Status: Fetal Heart Rate (bpm): 140  Movement: Present   No results found for this or any previous visit (from the past 24 hour(s)).  Assessment & Plan:  High-risk pregnancy of a 40 y.o., [id number] at [redacted]w[redacted]d with an Estimated Date of Delivery:  12/17/20   1. Supervision of high risk pregnancy, antepartum -Anticipatory guidance for upcoming appts. -Patient to next appt in 2 weeks for an in-person visit. -Reviewed potential bleeding abnormalities with Nexplanon.   2. [redacted] weeks gestation of pregnancy -Doing well. -Reviewed previous labs.    Meds: No orders of the defined types were placed in this encounter.  Labs/procedures today:  Lab Orders  No laboratory test(s) ordered today     Reviewed: Preterm labor symptoms and general obstetric precautions including but not limited to vaginal bleeding, contractions, leaking of fluid and fetal movement were reviewed in detail with the patient.  All questions were answered.  Follow-up: Return in about 2 weeks (around 11/07/2020) for [place].  No orders of the defined types were placed in this encounter.  [name] MSN, CNM 10/24/2020

## 2020-10-24 NOTE — Patient Instructions (Signed)
Etonogestrel implant What is this medicine? ETONOGESTREL (et oh noe JES trel) is a contraceptive (birth control) device. It is used to prevent pregnancy. It can be used for up to 3 years. This medicine may be used for other purposes; ask your health care provider or pharmacist if you have questions. COMMON BRAND NAME(S): Implanon, Nexplanon What should I tell my health care provider before I take this medicine? They need to know if you have any of these conditions:  abnormal vaginal bleeding  blood vessel disease or blood clots  breast, cervical, endometrial, ovarian, liver, or uterine cancer  diabetes  gallbladder disease  heart disease or recent heart attack  high blood pressure  high cholesterol or triglycerides  kidney disease  liver disease  migraine headaches  seizures  stroke  tobacco smoker  an unusual or allergic reaction to etonogestrel, anesthetics or antiseptics, other medicines, foods, dyes, or preservatives  pregnant or trying to get pregnant  breast-feeding How should I use this medicine? This device is inserted just under the skin on the inner side of your upper arm by a health care professional. Talk to your pediatrician regarding the use of this medicine in children. Special care may be needed. Overdosage: If you think you have taken too much of this medicine contact a poison control center or emergency room at once. NOTE: This medicine is only for you. Do not share this medicine with others. What if I miss a dose? This does not apply. What may interact with this medicine? Do not take this medicine with any of the following medications:  amprenavir  fosamprenavir This medicine may also interact with the following medications:  acitretin  aprepitant  armodafinil  bexarotene  bosentan  carbamazepine  certain medicines for fungal infections like fluconazole, ketoconazole, itraconazole and voriconazole  certain medicines to treat  hepatitis, HIV or AIDS  cyclosporine  felbamate  griseofulvin  lamotrigine  modafinil  oxcarbazepine  phenobarbital  phenytoin  primidone  rifabutin  rifampin  rifapentine  St. John's wort  topiramate This list may not describe all possible interactions. Give your health care provider a list of all the medicines, herbs, non-prescription drugs, or dietary supplements you use. Also tell them if you smoke, drink alcohol, or use illegal drugs. Some items may interact with your medicine. What should I watch for while using this medicine? This product does not protect you against HIV infection (AIDS) or other sexually transmitted diseases. You should be able to feel the implant by pressing your fingertips over the skin where it was inserted. Contact your doctor if you cannot feel the implant, and use a non-hormonal birth control method (such as condoms) until your doctor confirms that the implant is in place. Contact your doctor if you think that the implant may have broken or become bent while in your arm. You will receive a user card from your health care provider after the implant is inserted. The card is a record of the location of the implant in your upper arm and when it should be removed. Keep this card with your health records. What side effects may I notice from receiving this medicine? Side effects that you should report to your doctor or health care professional as soon as possible:  allergic reactions like skin rash, itching or hives, swelling of the face, lips, or tongue  breast lumps, breast tissue changes, or discharge  breathing problems  changes in emotions or moods  coughing up blood  if you feel that the implant   may have broken or bent while in your arm  high blood pressure  pain, irritation, swelling, or bruising at the insertion site  scar at site of insertion  signs of infection at the insertion site such as fever, and skin redness, pain or  discharge  signs and symptoms of a blood clot such as breathing problems; changes in vision; chest pain; severe, sudden headache; pain, swelling, warmth in the leg; trouble speaking; sudden numbness or weakness of the face, arm or leg  signs and symptoms of liver injury like dark yellow or [place] urine; general ill feeling or flu-like symptoms; light-colored stools; loss of appetite; nausea; right upper belly pain; unusually weak or tired; yellowing of the eyes or skin  unusual vaginal bleeding, discharge Side effects that usually do not require medical attention (report to your doctor or health care professional if they continue or are bothersome):  acne  breast pain or tenderness  headache  irregular menstrual bleeding  nausea This list may not describe all possible side effects. Call your doctor for medical advice about side effects. You may report side effects to FDA at 1-800-FDA-1088. Where should I keep my medicine? This drug is given in a hospital or clinic and will not be stored at home. NOTE: This sheet is a summary. It may not cover all possible information. If you have questions about this medicine, talk to your doctor, pharmacist, or health care provider.  2021 Elsevier/Gold Standard (2019-03-07 11:33:04)  

## 2020-10-25 ENCOUNTER — Ambulatory Visit: Payer: Medicaid Other | Admitting: *Deleted

## 2020-10-25 ENCOUNTER — Ambulatory Visit: Payer: Medicaid Other | Attending: Obstetrics and Gynecology

## 2020-10-25 ENCOUNTER — Encounter: Payer: Self-pay | Admitting: *Deleted

## 2020-10-25 DIAGNOSIS — O09522 Supervision of elderly multigravida, second trimester: Secondary | ICD-10-CM | POA: Diagnosis not present

## 2020-10-25 DIAGNOSIS — O0990 Supervision of high risk pregnancy, unspecified, unspecified trimester: Secondary | ICD-10-CM | POA: Diagnosis present

## 2020-10-31 ENCOUNTER — Inpatient Hospital Stay (HOSPITAL_COMMUNITY)
Admission: AD | Admit: 2020-10-31 | Discharge: 2020-11-01 | Disposition: A | Discharge status: Home / Self Care | Payer: Medicaid Other | Attending: Obstetrics & Gynecology | Admitting: Obstetrics & Gynecology

## 2020-10-31 ENCOUNTER — Encounter (HOSPITAL_COMMUNITY): Payer: Self-pay

## 2020-10-31 ENCOUNTER — Other Ambulatory Visit: Payer: Self-pay

## 2020-10-31 DIAGNOSIS — O09523 Supervision of elderly multigravida, third trimester: Secondary | ICD-10-CM | POA: Diagnosis not present

## 2020-10-31 DIAGNOSIS — Z90721 Acquired absence of ovaries, unilateral: Secondary | ICD-10-CM | POA: Diagnosis not present

## 2020-10-31 DIAGNOSIS — Z87891 Personal history of nicotine dependence: Secondary | ICD-10-CM | POA: Diagnosis not present

## 2020-10-31 DIAGNOSIS — Z3A33 33 weeks gestation of pregnancy: Secondary | ICD-10-CM | POA: Diagnosis not present

## 2020-10-31 DIAGNOSIS — Z7982 Long term (current) use of aspirin: Secondary | ICD-10-CM | POA: Insufficient documentation

## 2020-10-31 DIAGNOSIS — O99613 Diseases of the digestive system complicating pregnancy, third trimester: Secondary | ICD-10-CM | POA: Diagnosis not present

## 2020-10-31 DIAGNOSIS — Z3689 Encounter for other specified antenatal screening: Secondary | ICD-10-CM

## 2020-10-31 DIAGNOSIS — O212 Late vomiting of pregnancy: Secondary | ICD-10-CM | POA: Diagnosis present

## 2020-10-31 DIAGNOSIS — O218 Other vomiting complicating pregnancy: Secondary | ICD-10-CM | POA: Diagnosis not present

## 2020-10-31 DIAGNOSIS — O99891 Other specified diseases and conditions complicating pregnancy: Secondary | ICD-10-CM | POA: Insufficient documentation

## 2020-10-31 DIAGNOSIS — R9431 Abnormal electrocardiogram [ECG] [EKG]: Secondary | ICD-10-CM | POA: Diagnosis not present

## 2020-10-31 DIAGNOSIS — K529 Noninfective gastroenteritis and colitis, unspecified: Secondary | ICD-10-CM | POA: Diagnosis not present

## 2020-10-31 LAB — URINALYSIS, ROUTINE W REFLEX MICROSCOPIC
Bilirubin Urine: NEGATIVE
Glucose, UA: NEGATIVE mg/dL
Ketones, ur: 80 mg/dL — AB
Leukocytes,Ua: NEGATIVE
Nitrite: NEGATIVE
Protein, ur: 100 mg/dL — AB
Specific Gravity, Urine: 1.027 (ref 1.005–1.030)
pH: 5 (ref 5.0–8.0)

## 2020-10-31 LAB — CBC WITH DIFFERENTIAL/PLATELET
Abs Immature Granulocytes: 0.06 10*3/uL (ref 0.00–0.07)
Basophils Absolute: 0 10*3/uL (ref 0.0–0.1)
Basophils Relative: 0 %
Eosinophils Absolute: 0 10*3/uL (ref 0.0–0.5)
Eosinophils Relative: 0 %
HCT: 37.7 % (ref 36.0–46.0)
Hemoglobin: 13.6 g/dL (ref 12.0–15.0)
Immature Granulocytes: 1 %
Lymphocytes Relative: 11 %
Lymphs Abs: 1 10*3/uL (ref 0.7–4.0)
MCH: 31.6 pg (ref 26.0–34.0)
MCHC: 36.1 g/dL — ABNORMAL HIGH (ref 30.0–36.0)
MCV: 87.5 fL (ref 80.0–100.0)
Monocytes Absolute: 0.4 10*3/uL (ref 0.1–1.0)
Monocytes Relative: 5 %
Neutro Abs: 7.1 10*3/uL (ref 1.7–7.7)
Neutrophils Relative %: 83 %
Platelets: 285 10*3/uL (ref 150–400)
RBC: 4.31 MIL/uL (ref 3.87–5.11)
RDW: 12.7 % (ref 11.5–15.5)
WBC: 8.6 10*3/uL (ref 4.0–10.5)
nRBC: 0 % (ref 0.0–0.2)

## 2020-10-31 MED ORDER — LACTATED RINGERS IV BOLUS
1000.0000 mL | Freq: Once | INTRAVENOUS | Status: AC
  Administered 2020-10-31: 1000 mL via INTRAVENOUS

## 2020-10-31 MED ORDER — FAMOTIDINE IN NACL 20-0.9 MG/50ML-% IV SOLN
20.0000 mg | Freq: Once | INTRAVENOUS | Status: AC
  Administered 2020-10-31: 20 mg via INTRAVENOUS
  Filled 2020-10-31: qty 50

## 2020-10-31 MED ORDER — METOCLOPRAMIDE HCL 5 MG/ML IJ SOLN
10.0000 mg | Freq: Once | INTRAMUSCULAR | Status: AC
  Administered 2020-10-31: 10 mg via INTRAMUSCULAR
  Filled 2020-10-31: qty 2

## 2020-10-31 MED ORDER — METOCLOPRAMIDE HCL 5 MG/ML IJ SOLN: 10.0000 mg | Freq: Once | INTRAMUSCULAR | Status: DC

## 2020-10-31 NOTE — MAU Note (Signed)
Pt states she started throwing up 3 days ago. Pt states that it looked [place].

## 2020-11-01 ENCOUNTER — Inpatient Hospital Stay (HOSPITAL_COMMUNITY)
Admission: AD | Admit: 2020-11-01 | Discharge: 2020-11-08 | DRG: 833 | Disposition: A | Discharge status: Home / Self Care | Payer: Medicaid Other | Attending: Family Medicine | Admitting: Family Medicine

## 2020-11-01 ENCOUNTER — Other Ambulatory Visit: Payer: Self-pay

## 2020-11-01 ENCOUNTER — Encounter (HOSPITAL_COMMUNITY): Payer: Self-pay | Admitting: Obstetrics & Gynecology

## 2020-11-01 DIAGNOSIS — O99013 Anemia complicating pregnancy, third trimester: Secondary | ICD-10-CM | POA: Diagnosis present

## 2020-11-01 DIAGNOSIS — K219 Gastro-esophageal reflux disease without esophagitis: Secondary | ICD-10-CM | POA: Diagnosis present

## 2020-11-01 DIAGNOSIS — O99613 Diseases of the digestive system complicating pregnancy, third trimester: Secondary | ICD-10-CM

## 2020-11-01 DIAGNOSIS — O09523 Supervision of elderly multigravida, third trimester: Secondary | ICD-10-CM | POA: Diagnosis not present

## 2020-11-01 DIAGNOSIS — E860 Dehydration: Secondary | ICD-10-CM

## 2020-11-01 DIAGNOSIS — Z3A33 33 weeks gestation of pregnancy: Secondary | ICD-10-CM | POA: Diagnosis not present

## 2020-11-01 DIAGNOSIS — Z20822 Contact with and (suspected) exposure to covid-19: Secondary | ICD-10-CM | POA: Diagnosis present

## 2020-11-01 DIAGNOSIS — Z87891 Personal history of nicotine dependence: Secondary | ICD-10-CM | POA: Diagnosis not present

## 2020-11-01 DIAGNOSIS — K529 Noninfective gastroenteritis and colitis, unspecified: Secondary | ICD-10-CM | POA: Diagnosis not present

## 2020-11-01 DIAGNOSIS — O211 Hyperemesis gravidarum with metabolic disturbance: Principal | ICD-10-CM | POA: Diagnosis present

## 2020-11-01 DIAGNOSIS — O210 Mild hyperemesis gravidarum: Secondary | ICD-10-CM | POA: Diagnosis not present

## 2020-11-01 DIAGNOSIS — O212 Late vomiting of pregnancy: Secondary | ICD-10-CM | POA: Diagnosis not present

## 2020-11-01 DIAGNOSIS — F12188 Cannabis abuse with other cannabis-induced disorder: Secondary | ICD-10-CM

## 2020-11-01 DIAGNOSIS — Z3A34 34 weeks gestation of pregnancy: Secondary | ICD-10-CM | POA: Diagnosis not present

## 2020-11-01 DIAGNOSIS — D573 Sickle-cell trait: Secondary | ICD-10-CM | POA: Diagnosis present

## 2020-11-01 DIAGNOSIS — R1110 Vomiting, unspecified: Secondary | ICD-10-CM | POA: Diagnosis present

## 2020-11-01 DIAGNOSIS — Z8616 Personal history of COVID-19: Secondary | ICD-10-CM | POA: Diagnosis not present

## 2020-11-01 DIAGNOSIS — E876 Hypokalemia: Secondary | ICD-10-CM | POA: Diagnosis not present

## 2020-11-01 DIAGNOSIS — O99891 Other specified diseases and conditions complicating pregnancy: Secondary | ICD-10-CM | POA: Diagnosis not present

## 2020-11-01 DIAGNOSIS — O218 Other vomiting complicating pregnancy: Secondary | ICD-10-CM | POA: Diagnosis not present

## 2020-11-01 DIAGNOSIS — O99283 Endocrine, nutritional and metabolic diseases complicating pregnancy, third trimester: Secondary | ICD-10-CM | POA: Diagnosis not present

## 2020-11-01 LAB — MAGNESIUM: Magnesium: 1.9 mg/dL (ref 1.7–2.4)

## 2020-11-01 LAB — URINALYSIS, ROUTINE W REFLEX MICROSCOPIC
Bacteria, UA: NONE SEEN
Bilirubin Urine: NEGATIVE
Glucose, UA: NEGATIVE mg/dL
Ketones, ur: 80 mg/dL — AB
Leukocytes,Ua: NEGATIVE
Nitrite: NEGATIVE
Protein, ur: 100 mg/dL — AB
Specific Gravity, Urine: 1.026 (ref 1.005–1.030)
pH: 5 (ref 5.0–8.0)

## 2020-11-01 LAB — RAPID URINE DRUG SCREEN, HOSP PERFORMED
Amphetamines: NOT DETECTED
Barbiturates: NOT DETECTED
Benzodiazepines: NOT DETECTED
Cocaine: NOT DETECTED
Opiates: NOT DETECTED
Tetrahydrocannabinol: POSITIVE — AB

## 2020-11-01 LAB — SARS CORONAVIRUS 2 (TAT 6-24 HRS): SARS Coronavirus 2: NEGATIVE

## 2020-11-01 LAB — COMPREHENSIVE METABOLIC PANEL
ALT: 13 U/L (ref 0–44)
AST: 22 U/L (ref 15–41)
Albumin: 3.3 g/dL — ABNORMAL LOW (ref 3.5–5.0)
Alkaline Phosphatase: 112 U/L (ref 38–126)
Anion gap: 14 (ref 5–15)
BUN: 5 mg/dL — ABNORMAL LOW (ref 6–20)
CO2: 18 mmol/L — ABNORMAL LOW (ref 22–32)
Calcium: 8.7 mg/dL — ABNORMAL LOW (ref 8.9–10.3)
Chloride: 104 mmol/L (ref 98–111)
Creatinine, Ser: 0.66 mg/dL (ref 0.44–1.00)
GFR, Estimated: >60 mL/min (ref >60)
Glucose, Bld: 119 mg/dL — ABNORMAL HIGH (ref 70–99)
Potassium: 3.1 mmol/L — ABNORMAL LOW (ref 3.5–5.1)
Sodium: 136 mmol/L (ref 135–145)
Total Bilirubin: 1.1 mg/dL (ref 0.3–1.2)
Total Protein: 7.1 g/dL (ref 6.5–8.1)

## 2020-11-01 LAB — TYPE AND SCREEN
ABO/RH(D): O POS
Antibody Screen: NEGATIVE

## 2020-11-01 MED ORDER — ZOLPIDEM TARTRATE 5 MG PO TABS
5.0000 mg | ORAL_TABLET | Freq: Every evening | ORAL | Status: DC | PRN
  Administered 2020-11-03: 5 mg via ORAL
  Filled 2020-11-01: qty 1

## 2020-11-01 MED ORDER — ACETAMINOPHEN 325 MG PO TABS
650.0000 mg | ORAL_TABLET | ORAL | Status: DC | PRN
  Administered 2020-11-04 – 2020-11-06 (×4): 650 mg via ORAL
  Filled 2020-11-01 (×4): qty 2

## 2020-11-01 MED ORDER — CALCIUM CARBONATE ANTACID 500 MG PO CHEW
2.0000 | CHEWABLE_TABLET | ORAL | Status: DC | PRN
  Administered 2020-11-03 – 2020-11-06 (×9): 400 mg via ORAL
  Filled 2020-11-01 (×10): qty 2

## 2020-11-01 MED ORDER — SCOPOLAMINE 1 MG/3DAYS TD PT72
1.0000 | MEDICATED_PATCH | TRANSDERMAL | Status: DC
  Administered 2020-11-01: 1.5 mg via TRANSDERMAL
  Filled 2020-11-01 (×2): qty 1

## 2020-11-01 MED ORDER — LACTATED RINGERS IV BOLUS
1000.0000 mL | Freq: Once | INTRAVENOUS | Status: AC
  Administered 2020-11-01: 1000 mL via INTRAVENOUS

## 2020-11-01 MED ORDER — SCOPOLAMINE 1 MG/3DAYS TD PT72
1.0000 | MEDICATED_PATCH | TRANSDERMAL | Status: DC
  Administered 2020-11-01: 1.5 mg via TRANSDERMAL
  Filled 2020-11-01: qty 1

## 2020-11-01 MED ORDER — SODIUM CHLORIDE 0.9 % IV SOLN
8.0000 mg | Freq: Once | INTRAVENOUS | Status: AC
  Administered 2020-11-01: 8 mg via INTRAVENOUS
  Filled 2020-11-01: qty 4

## 2020-11-01 MED ORDER — DIPHENHYDRAMINE HCL 50 MG/ML IJ SOLN
50.0000 mg | Freq: Once | INTRAMUSCULAR | Status: AC
  Administered 2020-11-01: 50 mg via INTRAVENOUS
  Filled 2020-11-01: qty 1

## 2020-11-01 MED ORDER — FAMOTIDINE IN NACL 20-0.9 MG/50ML-% IV SOLN
20.0000 mg | Freq: Once | INTRAVENOUS | Status: AC
  Administered 2020-11-01: 20 mg via INTRAVENOUS
  Filled 2020-11-01: qty 50

## 2020-11-01 MED ORDER — ONDANSETRON 8 MG PO TBDP: 8.0000 mg | ORAL_TABLET | Freq: Three times a day (TID) | ORAL | 0 refills | Status: DC | PRN

## 2020-11-01 MED ORDER — DOCUSATE SODIUM 100 MG PO CAPS
100.0000 mg | ORAL_CAPSULE | Freq: Every day | ORAL | Status: DC
  Administered 2020-11-06 – 2020-11-08 (×2): 100 mg via ORAL
  Filled 2020-11-01 (×6): qty 1

## 2020-11-01 MED ORDER — HYDROXYZINE HCL 25 MG PO TABS: 25.0000 mg | ORAL_TABLET | Freq: Four times a day (QID) | ORAL | 2 refills | Status: DC

## 2020-11-01 MED ORDER — POTASSIUM CHLORIDE 10 MEQ/100ML IV SOLN
10.0000 meq | INTRAVENOUS | Status: AC
  Administered 2020-11-01 (×2): 10 meq via INTRAVENOUS
  Filled 2020-11-01 (×2): qty 100

## 2020-11-01 MED ORDER — POTASSIUM CHLORIDE 10 MEQ/100ML IV SOLN
10.0000 meq | INTRAVENOUS | Status: AC
  Administered 2020-11-01 (×3): 10 meq via INTRAVENOUS
  Filled 2020-11-01 (×2): qty 100

## 2020-11-01 MED ORDER — POTASSIUM CHLORIDE CRYS ER 10 MEQ PO TBCR: 10.0000 meq | EXTENDED_RELEASE_TABLET | Freq: Every day | ORAL | 0 refills | Status: DC

## 2020-11-01 MED ORDER — HALOPERIDOL LACTATE 5 MG/ML IJ SOLN
3.0000 mg | Freq: Once | INTRAMUSCULAR | Status: AC
  Administered 2020-11-01: 3 mg via INTRAVENOUS
  Filled 2020-11-01: qty 0.6

## 2020-11-01 MED ORDER — METOCLOPRAMIDE HCL 10 MG PO TABS: 10.0000 mg | ORAL_TABLET | Freq: Three times a day (TID) | ORAL | 0 refills | Status: DC

## 2020-11-01 MED ORDER — SCOPOLAMINE 1 MG/3DAYS TD PT72: 1.0000 | MEDICATED_PATCH | TRANSDERMAL | 12 refills | Status: DC

## 2020-11-01 MED ORDER — PRENATAL MULTIVITAMIN CH
1.0000 | ORAL_TABLET | Freq: Every day | ORAL | Status: DC
  Administered 2020-11-03 – 2020-11-08 (×5): 1 via ORAL
  Filled 2020-11-01 (×5): qty 1

## 2020-11-01 MED ORDER — LACTATED RINGERS IV SOLN: INTRAVENOUS | Status: DC

## 2020-11-01 MED ORDER — POTASSIUM CHLORIDE 10 MEQ/100ML IV SOLN: 10.0000 meq | INTRAVENOUS | Status: DC

## 2020-11-01 NOTE — MAU Provider Note (Signed)
History     CSN: [id number]  Arrival date and time: 10/31/20 2149   Event Date/Time   First Provider Initiated Contact with Patient 10/31/20 2249      Chief Complaint  Patient presents with  . Emesis   [name] is a 40 y.o. [id number] at 75w3dwho presents to MAU for nausea and vomiting that started 3 days ago. Patient reports nothing out of the ordinary occurred 3 days ago, but reports she ate at her sister's house. Patient reports she had macaroni and chicken with some gravy. Patient reports she started feeling sick within 24 hours. Patient reports the first two days patient was just "throwing up the yellow" about 3-4 times per day. Patient reports today symptoms got significantly worse and the vomit became [place], she could not sleep or rest, and reports vomiting x10 in past 24 hours. Patient reports she tried taking phenergan, but vomited it back up immediately. Patient reports she did not try taking anything else. Patient reports she was able to eat for the first 2 days, but then not at all yesterday, which is why she came to MAU tonight. Patient denies marijuana use and denies abdominal pain. Patient actively vomiting at time of initial interview with provider.  Pt denies VB, LOF, ctx, decreased FM, vaginal discharge/odor/itching. Pt denies N/V, abdominal pain, constipation, diarrhea, or urinary problems. Pt denies fever, chills, fatigue, sweating or changes in appetite. Pt denies SOB or chest pain. Pt denies dizziness, HA, light-headedness, weakness.  Problems this pregnancy include: QT prolongation. Allergies? NKDA Current medications/supplements? Phenergan PRN Prenatal care provider? [facility], next appt 11/09/2020   OB History    Gravida  8   Para  5   Term  5   Preterm  0   AB  2   Living  5     SAB  0   IAB  1   Ectopic  0   Multiple  0   Live Births  5           Past Medical History:  Diagnosis Date  . Anxiety   . Ascites   . Cannabis  hyperemesis syndrome concurrent with and [place] to [place] ([place]   . Sickle cell trait ([place]   . Umbilical hernia     Past Surgical History:  Procedure Laterality Date  . DILATION AND CURETTAGE OF UTERUS    . DILATION AND EVACUATION N/A 04/17/2019   Procedure: SUCTION DILATATION AND EVACUATION;  Surgeon: PAletha Halim MD;  [facility]  Service: Gynecology;  Laterality: N/A;  . OPERATIVE ULTRASOUND N/A 04/17/2019   Procedure: OPERATIVE ULTRASOUND;  Surgeon: PAletha Halim MD;  [facility]  Service: Gynecology;  Laterality: N/A;  . RIGHT OOPHORECTOMY  07/2016   diagnostic laparoscopy converted to ex lap w/ umbilical hernia repair @ [facility] (benign)  . UMBILICAL HERNIA REPAIR  07/2016   [facility]    Family History  Problem Relation Age of Onset  . Cancer Maternal Grandmother   . Cancer Paternal Grandmother   . Asthma Mother   . Multiple sclerosis Sister   . Anesthesia problems Neg Hx   . Hypotension Neg Hx   . Malignant hyperthermia Neg Hx   . Pseudochol deficiency Neg Hx     Social History   Tobacco Use  . Smoking status: Former Smoker    Packs/day: 0.50    Types: Cigarettes    Quit date: 11/21/2015    Years since quitting: 4.9  . Smokeless tobacco: Never Used  .  Tobacco comment: unable to smoke last 2 wks  Vaping Use  . Vaping Use: Never used  Substance Use Topics  . Alcohol use: No    Comment: occ  . Drug use: Not Currently    Types: Marijuana    Comment: stopped June 2020    Allergies: No Known Allergies  Medications Prior to Admission  Medication Sig Dispense Refill Last Dose  . aspirin EC 81 MG tablet Take 1 tablet (81 mg total) by mouth daily. Take after 12 weeks for prevention of preeclampsia later in pregnancy (Patient not taking: Reported on 10/08/2020) 300 tablet 2   . Blood Pressure Monitor KIT 1 Device by Does not apply route once a week. To be monitored Regularly at home. 1 kit 0   . hydrOXYzine (ATARAX/VISTARIL) 25 MG tablet Take 1 tablet (25  mg total) by mouth every 6 (six) hours. (Patient not taking: No sig reported) 12 tablet 2   . Prenatal Vit-DSS-Fe Fum-FA (PRENATAL 19) 29-1 MG TABS Take 1 tablet by mouth daily. (Patient not taking: Reported on 10/08/2020) 30 tablet 11   . Prenatal Vit-Fe Fumarate-FA (PREPLUS) 27-1 MG TABS Take 1 tablet by mouth daily. 30 tablet 13     Review of Systems  Constitutional: Negative for chills, diaphoresis, fatigue and fever.  Eyes: Negative for visual disturbance.  Respiratory: Negative for shortness of breath.   Cardiovascular: Negative for chest pain.  Gastrointestinal: Positive for nausea and vomiting. Negative for abdominal pain, constipation and diarrhea.  Genitourinary: Negative for dysuria, flank pain, frequency, pelvic pain, urgency, vaginal bleeding and vaginal discharge.  Neurological: Negative for dizziness, weakness, light-headedness and headaches.   Physical Exam   Blood pressure 121/77, pulse (!) 124, temperature 98.4 F (36.9 C), resp. rate 16, last menstrual period 03/12/2020, SpO2 100 %, unknown if currently breastfeeding.  Patient Vitals for the past 24 hrs:  BP Temp Pulse Resp SpO2  10/31/20 2209 121/77 98.4 F (36.9 C) (!) 124 16 100 %   Physical Exam Vitals and nursing note reviewed.  Constitutional:      General: She is not in acute distress.    Appearance: Normal appearance. She is ill-appearing. She is not toxic-appearing or diaphoretic.  HENT:     Head: Normocephalic and atraumatic.  Pulmonary:     Effort: Pulmonary effort is normal.  Neurological:     Mental Status: She is alert and oriented to person, place, and time.  Psychiatric:        Mood and Affect: Mood normal.        Behavior: Behavior normal.        Thought Content: Thought content normal.        Judgment: Judgment normal.    Results for orders placed or performed during the hospital encounter of 10/31/20 (from the past 24 hour(s))  Urinalysis, Routine w reflex microscopic Urine, Clean Catch      Status: Abnormal   Collection Time: 10/31/20 10:00 PM  Result Value Ref Range   Color, Urine YELLOW YELLOW   APPearance HAZY (A) CLEAR   Specific Gravity, Urine 1.027 1.005 - 1.030   pH 5.0 5.0 - 8.0   Glucose, UA NEGATIVE NEGATIVE mg/dL   Hgb urine dipstick SMALL (A) NEGATIVE   Bilirubin Urine NEGATIVE NEGATIVE   Ketones, ur 80 (A) NEGATIVE mg/dL   Protein, ur 100 (A) NEGATIVE mg/dL   Nitrite NEGATIVE NEGATIVE   Leukocytes,Ua NEGATIVE NEGATIVE   RBC / HPF 0-5 0 - 5 RBC/hpf   WBC, UA 0-5 0 -  5 WBC/hpf   Bacteria, UA RARE (A) NONE SEEN   Squamous Epithelial / LPF 0-5 0 - 5   Mucus PRESENT   CBC with Differential/Platelet     Status: Abnormal   Collection Time: 10/31/20 11:24 PM  Result Value Ref Range   WBC 8.6 4.0 - 10.5 K/uL   RBC 4.31 3.87 - 5.11 MIL/uL   Hemoglobin 13.6 12.0 - 15.0 g/dL   HCT 37.7 36.0 - 46.0 %   MCV 87.5 80.0 - 100.0 fL   MCH 31.6 26.0 - 34.0 pg   MCHC 36.1 (H) 30.0 - 36.0 g/dL   RDW 12.7 11.5 - 15.5 %   Platelets 285 150 - 400 K/uL   nRBC 0.0 0.0 - 0.2 %   Neutrophils Relative % 83 %   Neutro Abs 7.1 1.7 - 7.7 K/uL   Lymphocytes Relative 11 %   Lymphs Abs 1.0 0.7 - 4.0 K/uL   Monocytes Relative 5 %   Monocytes Absolute 0.4 0.1 - 1.0 K/uL   Eosinophils Relative 0 %   Eosinophils Absolute 0.0 0.0 - 0.5 K/uL   Basophils Relative 0 %   Basophils Absolute 0.0 0.0 - 0.1 K/uL   Immature Granulocytes 1 %   Abs Immature Granulocytes 0.06 0.00 - 0.07 K/uL  Comprehensive metabolic panel     Status: Abnormal   Collection Time: 10/31/20 11:24 PM  Result Value Ref Range   Sodium 136 135 - 145 mmol/L   Potassium 3.1 (L) 3.5 - 5.1 mmol/L   Chloride 104 98 - 111 mmol/L   CO2 18 (L) 22 - 32 mmol/L   Glucose, Bld 119 (H) 70 - 99 mg/dL   BUN 5 (L) 6 - 20 mg/dL   Creatinine, Ser 0.66 0.44 - 1.00 mg/dL   Calcium 8.7 (L) 8.9 - 10.3 mg/dL   Total Protein 7.1 6.5 - 8.1 g/dL   Albumin 3.3 (L) 3.5 - 5.0 g/dL   AST 22 15 - 41 U/L   ALT 13 0 - 44 U/L   Alkaline  Phosphatase 112 38 - 126 U/L   Total Bilirubin 1.1 0.3 - 1.2 mg/dL   GFR, Estimated >60 >60 mL/min   Anion gap 14 5 - 15  Magnesium     Status: None   Collection Time: 10/31/20 11:24 PM  Result Value Ref Range   Magnesium 1.9 1.7 - 2.4 mg/dL   MAU Course  Procedures  MDM -N/V in pregnancy, likely gastroenteritis -UA: hazy/sm hgb/80ketones/100PRO -EKG: QT/QTc-Baz 472/538 -CBC w/Diff: WNL -CMP: K 3.1 -1L LR + 2m Pepcid IV + Scopolamine Transderman + 175mReglan IM given, pt reports NV now resolved -pt able to urinate after fluids -PO challenge successful -EFM: reactive       -baseline: 145       -variability: moderate       -accels: present, 15x15       -decels: absent       -TOCO: quiet -consulted with Dr. AnHarolyn Rutherfordin setting of QT prolongation, can give Reglan and Scopolamine + Pepcid for N/V, pt can get potassium for home use, does not need IV potassium in MAU -pt discharged to home in stable condition  Orders Placed This Encounter  Procedures  . Urinalysis, Routine w reflex microscopic Urine, Clean Catch    Standing Status:   Standing    Number of Occurrences:   1  . CBC with Differential/Platelet    Standing Status:   Standing    Number of Occurrences:   1  .  Comprehensive metabolic panel    Standing Status:   Standing    Number of Occurrences:   1  . Magnesium    Standing Status:   Standing    Number of Occurrences:   1  . Urine rapid drug screen (hosp performed)    Standing Status:   Standing    Number of Occurrences:   1  . ED EKG    Standing Status:   Standing    Number of Occurrences:   1    Order Specific Question:   Reason for Exam    Answer:   Chest Pain  . Insert peripheral IV    Standing Status:   Standing    Number of Occurrences:   1  . Discharge patient    Order Specific Question:   Discharge disposition    Answer:   01-Home or Self Care [1]    Order Specific Question:   Discharge patient date    Answer:   11/01/2020   Meds ordered this  encounter  Medications  . lactated ringers bolus 1,000 mL  . famotidine (PEPCID) IVPB 20 mg premix  . DISCONTD: metoCLOPramide (REGLAN) injection 10 mg  . metoCLOPramide (REGLAN) injection 10 mg  . scopolamine (TRANSDERM-SCOP) 1 MG/3DAYS 1.5 mg  . scopolamine (TRANSDERM-SCOP) 1 MG/3DAYS    Sig: Place 1 patch (1.5 mg total) onto the skin every 3 (three) days.    Dispense:  10 patch    Refill:  12    Order Specific Question:   Supervising Provider    Answer:   Verita Schneiders A [[id number]]  . metoCLOPramide (REGLAN) 10 MG tablet    Sig: Take 1 tablet (10 mg total) by mouth 3 (three) times daily with meals.    Dispense:  30 tablet    Refill:  0    Order Specific Question:   Supervising Provider    Answer:   Verita Schneiders A [[id number]]  . potassium chloride (KLOR-CON) 10 MEQ tablet    Sig: Take 1 tablet (10 mEq total) by mouth daily.    Dispense:  8 tablet    Refill:  0    Order Specific Question:   Supervising Provider    Answer:   Verita Schneiders A [[id number]]   Assessment and Plan   1. Gastroenteritis   2. [redacted] weeks gestation of pregnancy   3. NST (non-stress test) reactive    Allergies as of 11/01/2020   No Known Allergies     Medication List    TAKE these medications   aspirin EC 81 MG tablet Take 1 tablet (81 mg total) by mouth daily. Take after 12 weeks for prevention of preeclampsia later in pregnancy   Blood Pressure Monitor Kit 1 Device by Does not apply route once a week. To be monitored Regularly at home.   hydrOXYzine 25 MG tablet Commonly known as: ATARAX/VISTARIL Take 1 tablet (25 mg total) by mouth every 6 (six) hours.   metoCLOPramide 10 MG tablet Commonly known as: REGLAN Take 1 tablet (10 mg total) by mouth 3 (three) times daily with meals.   potassium chloride 10 MEQ tablet Commonly known as: KLOR-CON Take 1 tablet (10 mEq total) by mouth daily.   Prenatal 19 29-1 MG Tabs Take 1 tablet by mouth daily.   PrePLUS 27-1 MG Tabs Take 1 tablet by mouth daily.    scopolamine 1 MG/3DAYS Commonly known as: TRANSDERM-SCOP Place 1 patch (1.5 mg total) onto the skin every 3 (three) days.      -  RX KDur -RX Reglan -RX Scopolamine -return MAU precautions given -pt discharged to home in stable condition  Elmyra Ricks E [place] 11/01/2020, 1:39 AM

## 2020-11-01 NOTE — Discharge Instructions (Signed)
Cannabinoid Hyperemesis Syndrome Cannabinoid hyperemesis syndrome (CHS) is a condition that causes repeated nausea, vomiting, and abdominal pain after long-term (chronic) use of marijuana (cannabis). People with CHS typically use marijuana 3-5 times a day for many years before they have symptoms, although it is possible to develop CHS with far less daily use. Symptoms of CHS may be mild at first but can get worse and more frequent. In some cases, CHS may cause severe daily vomiting, which can lead to weight loss and dehydration. What are the causes? The exact cause of this condition is not known. Long-term use of marijuana may over-stimulate certain proteins in the brain and gastrointestinal tract that react with chemicals in marijuana (cannabinoid receptors). This over-stimulation may cause CHS. What are the signs or symptoms? Symptoms of this condition are often mild during the first few episodes, but they can get worse over time. Symptoms may include:  Frequent nausea, especially early in the morning.  Vomiting. This can become severe.  Abdominal pain.  Feeling very tired (lethargic).  Headaches. CHS may go away and come back many times (recur). People may not have symptoms or may otherwise be healthy in between [facility] episodes. Taking hot showers can relieve the symptoms of CHS, so feeling the need to take several hot showers throughout the day can be a sign of this condition. How is this diagnosed? This condition may be diagnosed based on:  Your symptoms and medical history, including any drug use.  A physical exam. You may have tests done to rule out other problems that could cause your symptoms. These tests may include:  Blood tests.  Urine tests.  Imaging tests, such as an X-ray or a CT scan. How is this treated? Treatment for this condition involves stopping marijuana use. Treatment may include:  A drug rehabilitation program, if you have trouble stopping marijuana  use.  Medicines for nausea. These may be given at the hospital through an IV inserted into one of your veins, or they may be medicines that you take by mouth (orally).  Certain creams that contain a substance called capsaicin. These may improve symptoms when applied to the abdomen.  Hot showers to help relieve symptoms. In severe cases, you may need treatment at a hospital. You may be given IV fluids to prevent or treat dehydration as well as medicines to treat nausea, vomiting, and pain. Follow these instructions at home: During an episode of CHS  Stay in bed and rest in a dark, quiet room.  Take anti-nausea medicine as told by your health care provider.  Try taking hot showers to relieve your symptoms.   After an episode of CHS  Drink small amounts of clear fluids slowly. Gradually add more if you can keep the fluids down without vomiting.  Once you are able to eat without vomiting, eat soft foods in small amounts every 3-4 hours. General instructions  Do not use any products that contain marijuana.If you need help quitting, ask your health care provider for resources and treatment options.  Drink enough fluid to keep your urine pale yellow. Avoid drinking fluids that have a lot of sugar or caffeine, such as coffee and soda.  Take and apply over-the-counter and prescription medicines only as told by your health care provider. Ask your health care provider before starting any new medicines or treatments.  Keep all follow-up visits as told by your health care provider. This is important. This includes any recommended substance abuse programs.   Contact a health care provider  if:  Your symptoms get worse.  You cannot drink fluids without vomiting or severe pain.  You have pain and trouble swallowing after an episode. Get help right away if you:  Cannot stop vomiting.  Have blood in your vomit or your vomit looks like coffee grounds.  Have severe abdominal pain.  Have  stools that are bloody or black, or stools that look like tar.  Have symptoms of dehydration, such as: ? Sunken eyes. ? Inability to make tears. ? Cracked lips. ? Dry mouth. ? Decreased urine production. ? Weakness. ? Sleepiness. ? Dizziness, light-headedness, or fainting. Summary  Cannabinoid hyperemesis syndrome (CHS) is a condition that causes repeated nausea, vomiting, and abdominal pain after long-term use of marijuana.  People with CHS typically use marijuana 3-5 times a day for many years before they have symptoms, although it is possible to develop CHS with much less daily use.  Treatment for this condition involves stopping marijuana use. Hot showers and capsaicin creams may also help relieve symptoms. Ask your health care provider before starting any medicines or other treatments.  Your health care provider may prescribe medicines to help with nausea.  Get help right away if you have signs of dehydration, such as dry mouth, decreased urine production, weakness, dizziness, and light-headedness. This information is not intended to replace advice given to you by your health care provider. Make sure you discuss any questions you have with your health care provider. Document Revised: 03/22/2019 Document Reviewed: 03/22/2019 Elsevier Patient Education  2021 [employer].

## 2020-11-01 NOTE — MAU Note (Signed)
Presents with c/o N/V, reports unable to keep anything down.  States was seen in MAU last night and vomiting has continued since discharge.   Denies VB and LOF.  Endorses +FM.

## 2020-11-01 NOTE — MAU Provider Note (Signed)
History     CSN: [id number]  Arrival date and time: 11/01/20 4098   Event Date/Time   First Provider Initiated Contact with Patient 11/01/20 1000      Chief Complaint  Patient presents with  . Nausea  . Emesis   HPI   Veronica Green is a 40 y.o. female 40 y.o. female [phone] @ 9w3dwith a current diagnoses of cannabis hyperemesis. She has multiple MAU visits for treatment/ IV fluids and antiemetics.  She was seen last night in MAU and is not happy with not receiving enough IV fluid. She reports not being able to keep anything down. She is not taking the medication that was prescribed to her for home use. She denies cannabis use x 1 month.   OB History    Gravida  8   Para  5   Term  5   Preterm  0   AB  2   Living  5     SAB  0   IAB  1   Ectopic  0   Multiple  0   Live Births  5           Past Medical History:  Diagnosis Date  . Anxiety   . Ascites   . Cannabis hyperem[place] syndrome [place] and [place] to [place] ([place]   . [place] ([place]   . Umbilical hernia     Past Surgical History:  Procedure Laterality Date  . DILATION AND CURETTAGE OF UTERUS    . DILATION AND EVACUATION N/A 04/17/2019   Procedure: SUCTION DILATATION AND EVACUATION;  Surgeon: PAletha Halim MD;  [place] [facility]  Service: Gynecology;  Laterality: N/A;  . OPERATIVE ULTRASOUND N/A 04/17/2019   Procedure: OPERATIVE ULTRASOUND;  Surgeon: PAletha Halim MD;  [facility]  Service: Gynecology;  Laterality: N/A;  . RIGHT OOPHORECTOMY  07/2016   diagnostic laparoscopy converted to ex lap w/ umbilical hernia repair @ [facility] (benign)  . UMBILICAL HERNIA REPAIR  07/2016   [facility]    Family History  Problem Relation Age of Onset  . Cancer Maternal Grandmother   . Cancer Paternal Grandmother   . Asthma Mother   . Multiple sclerosis Sister   . Healthy Father   . Anesthesia problems Neg Hx   . Hypotension Neg Hx   . Malignant hyperthermia Neg Hx   .  Pseudochol deficiency Neg Hx     Social History   Tobacco Use  . Smoking status: Former Smoker    Packs/day: 0.50    Types: Cigarettes    Quit date: 11/21/2015    Years since quitting: 4.9  . Smokeless tobacco: Never Used  . Tobacco comment: unable to smoke last 2 wks  Vaping Use  . Vaping Use: Never used  Substance Use Topics  . Alcohol use: No    Comment: occ  . Drug use: Not Currently    Types: Marijuana    Comment: stopped June 2020    Allergies: No Known Allergies  Medications Prior to Admission  Medication Sig Dispense Refill Last Dose  . Blood Pressure Monitor KIT 1 Device by Does not apply route once a week. To be monitored Regularly at home. 1 kit 0   . metoCLOPramide (REGLAN) 10 MG tablet Take 1 tablet (10 mg total) by mouth 3 (three) times daily with meals. 30 tablet 0   . Prenatal Vit-Fe Fumarate-FA (PREPLUS) 27-1 MG TABS Take 1 tablet by mouth daily. 30 tablet 13   .  scopolamine (TRANSDERM-SCOP) 1 MG/3DAYS Place 1 patch (1.5 mg total) onto the skin every 3 (three) days. 10 patch 12   . [DISCONTINUED] aspirin EC 81 MG tablet Take 1 tablet (81 mg total) by mouth daily. Take after 12 weeks for prevention of preeclampsia later in pregnancy (Patient not taking: No sig reported) 300 tablet 2   . [DISCONTINUED] hydrOXYzine (ATARAX/VISTARIL) 25 MG tablet Take 1 tablet (25 mg total) by mouth every 6 (six) hours. (Patient not taking: No sig reported) 12 tablet 2   . [DISCONTINUED] potassium chloride (KLOR-CON) 10 MEQ tablet Take 1 tablet (10 mEq total) by mouth daily. 8 tablet 0   . [DISCONTINUED] Prenatal Vit-DSS-Fe Fum-FA (PRENATAL 19) 29-1 MG TABS Take 1 tablet by mouth daily. (Patient not taking: No sig reported) 30 tablet 11    Results for orders placed or performed during the hospital encounter of 11/01/20 (from the past 48 hour(s))  Urinalysis, Routine w reflex microscopic Urine, Clean Catch     Status: Abnormal   Collection Time: 11/01/20 10:00 AM  Result Value Ref  Range   Color, Urine YELLOW YELLOW   APPearance HAZY (A) CLEAR   Specific Gravity, Urine 1.026 1.005 - 1.030   pH 5.0 5.0 - 8.0   Glucose, UA NEGATIVE NEGATIVE mg/dL   Hgb urine dipstick SMALL (A) NEGATIVE   Bilirubin Urine NEGATIVE NEGATIVE   Ketones, ur 80 (A) NEGATIVE mg/dL   Protein, ur 100 (A) NEGATIVE mg/dL   Nitrite NEGATIVE NEGATIVE   Leukocytes,Ua NEGATIVE NEGATIVE   RBC / HPF 0-5 0 - 5 RBC/hpf   WBC, UA 0-5 0 - 5 WBC/hpf   Bacteria, UA NONE SEEN NONE SEEN   Squamous Epithelial / LPF 0-5 0 - 5   Mucus PRESENT     Comment: Performed at [facility], [address], [place], [place] [address]    Review of Systems  Constitutional: Negative for fever.  Gastrointestinal: Positive for abdominal pain, nausea and vomiting. Negative for constipation and diarrhea.  Genitourinary: Negative for vaginal bleeding.   Physical Exam   Blood pressure 114/60, pulse 69, temperature 98.1 F (36.7 C), temperature source Oral, resp. rate 16, height _0  (1.626 m), weight 58.3 kg, last menstrual period 03/12/2020, SpO2 100 %, unknown if currently breastfeeding.  Physical Exam Vitals and nursing note reviewed.  Constitutional:      General: She is in acute distress.     Appearance: Normal appearance. She is ill-appearing. She is not toxic-appearing or diaphoretic.  HENT:     Head: Normocephalic.  Eyes:     Pupils: Pupils are equal, round, and reactive to light.  Abdominal:     Tenderness: There is generalized abdominal tenderness. There is no guarding or rebound.  Skin:    General: Skin is warm.  Neurological:     Mental Status: She is alert and oriented to person, place, and time.  Psychiatric:        Mood and Affect: Mood is depressed. Affect is tearful and inappropriate.        Behavior: Behavior is agitated.        Thought Content: Thought content does not include suicidal ideation.        Judgment: Judgment is impulsive.   Fetal Tracing: Baseline: 130 bpm Variability:  Moderate  Accelerations: 15x15 Decelerations: None Toco: Quiet   MAU Course  Procedures  None  MDM  EKG- history of QT prolongation.  Reviewed EKG with cardiology Dr. Debara Pickett, No QT prolongation on EKG today.  Zofran 8 mg IVP k- 3.1  20 meq of K+ given IVPB LR bolus x 3 liters, pepcid 20 mg IV, benadryl 50 mg IV Patient did not tolerate oral fluids, and vomited a small amount shortly after oral fluids Reviewed patient with Dr. Kennon Rounds, will admit for possible GI consult, fluids, haldol, and K replacement.   Assessment and Plan   A:  1. Cannabis hyperemesis syndrome concurrent with and due to cannabis abuse ([place])   2. Severe dehydration   3. [redacted] weeks gestation of pregnancy   4. Hypokalemia     P:  Admit to OB speciality Haldol 3 mg IV x 1 dose.  Will continue K IVPB LR @ 125/ hr Dr. Kennon Rounds to resume care  Jera Headings, Artist Pais, NP 11/01/2020 5:14 PM

## 2020-11-01 NOTE — H&P (Signed)
Veronica Green is an 40 y.o. [id number].   Chief Complaint: Nausea and vomiting HPI: Here with several day h/o N/V. Unable to keep anything down. Thinks she had eaten something bad though does have a h/o cannabinoid hyperemesis syndrome, but she states she has not used lately. Denies abdominal pain to me and wants to go home once she feels better. Had abdominal pain in MAU apparently.  Past Medical History:  Diagnosis Date   Anxiety    Ascites    Cannabis hyperemesis syndrome concurrent with and due to cannabis abuse ([place]    [place]    Umbilical hernia     Past Surgical History:  Procedure Laterality Date   DILATION AND CURETTAGE OF UTERUS     DILATION AND EVACUATION N/A 04/17/2019   Procedure: SUCTION DILATATION AND EVACUATION;  Surgeon: PAletha Halim MD;  [place] [facility]  Service: Gynecology;  Laterality: N/A;   OPERATIVE ULTRASOUND N/A 04/17/2019   Procedure: OPERATIVE ULTRASOUND;  Surgeon: PAletha Halim MD;  [place] [facility]  Service: Gynecology;  Laterality: N/A;   RIGHT OOPHORECTOMY  07/2016   diagnostic laparoscopy converted to ex lap w/ umbilical hernia repair @ [facility] (benign)   UMBILICAL HERNIA REPAIR  07/2016   [facility]    Family History  Problem Relation Age of Onset   Cancer Maternal Grandmother    Cancer Paternal Grandmother    Asthma Mother    Multiple sclerosis Sister    Healthy Father    Anesthesia problems Neg Hx    Hypotension Neg Hx    Malignant hyperthermia Neg Hx    Pseudochol deficiency Neg Hx    Social History:  reports that she quit smoking about 4 years ago. Her smoking use included cigarettes. She smoked 0.50 packs per day. She has never used smokeless tobacco. She reports previous drug use. Drug: Marijuana. She reports that she does not drink alcohol.   No Known Allergies  Medications Prior to Admission  Medication Sig Dispense Refill   Blood Pressure Monitor KIT 1 Device by Does not apply route once a week. To be  monitored Regularly at home. 1 kit 0   metoCLOPramide (REGLAN) 10 MG tablet Take 1 tablet (10 mg total) by mouth 3 (three) times daily with meals. 30 tablet 0   Prenatal Vit-Fe Fumarate-FA (PREPLUS) 27-1 MG TABS Take 1 tablet by mouth daily. 30 tablet 13   scopolamine (TRANSDERM-SCOP) 1 MG/3DAYS Place 1 patch (1.5 mg total) onto the skin every 3 (three) days. 10 patch 12   [DISCONTINUED] aspirin EC 81 MG tablet Take 1 tablet (81 mg total) by mouth daily. Take after 12 weeks for prevention of preeclampsia later in pregnancy (Patient not taking: No sig reported) 300 tablet 2   [DISCONTINUED] hydrOXYzine (ATARAX/VISTARIL) 25 MG tablet Take 1 tablet (25 mg total) by mouth every 6 (six) hours. (Patient not taking: No sig reported) 12 tablet 2   [DISCONTINUED] potassium chloride (KLOR-CON) 10 MEQ tablet Take 1 tablet (10 mEq total) by mouth daily. 8 tablet 0   [DISCONTINUED] Prenatal Vit-DSS-Fe Fum-FA (PRENATAL 19) 29-1 MG TABS Take 1 tablet by mouth daily. (Patient not taking: No sig reported) 30 tablet 11   Medications Prior to Admission  Medication Sig Dispense Refill   Blood Pressure Monitor KIT 1 Device by Does not apply route once a week. To be monitored Regularly at home. 1 kit 0   metoCLOPramide (REGLAN) 10 MG tablet Take 1 tablet (10 mg total) by mouth 3 (three) times  daily with meals. 30 tablet 0   Prenatal Vit-Fe Fumarate-FA (PREPLUS) 27-1 MG TABS Take 1 tablet by mouth daily. 30 tablet 13   scopolamine (TRANSDERM-SCOP) 1 MG/3DAYS Place 1 patch (1.5 mg total) onto the skin every 3 (three) days. 10 patch 12   [DISCONTINUED] aspirin EC 81 MG tablet Take 1 tablet (81 mg total) by mouth daily. Take after 12 weeks for prevention of preeclampsia later in pregnancy (Patient not taking: No sig reported) 300 tablet 2   [DISCONTINUED] hydrOXYzine (ATARAX/VISTARIL) 25 MG tablet Take 1 tablet (25 mg total) by mouth every 6 (six) hours. (Patient not taking: No sig reported) 12 tablet 2   [DISCONTINUED]  potassium chloride (KLOR-CON) 10 MEQ tablet Take 1 tablet (10 mEq total) by mouth daily. 8 tablet 0   [DISCONTINUED] Prenatal Vit-DSS-Fe Fum-FA (PRENATAL 19) 29-1 MG TABS Take 1 tablet by mouth daily. (Patient not taking: No sig reported) 30 tablet 11     Pertinent items are noted in HPI.  Blood pressure 114/60, pulse 69, temperature 98.1 F (36.7 C), temperature source Oral, resp. rate 16, height '5\' 4"'  (1.626 m), weight 58.3 kg, last menstrual period 03/12/2020, SpO2 100 %, unknown if currently breastfeeding. General appearance: alert, cooperative, and appears stated age Neck: no adenopathy, supple, symmetrical, trachea midline, and thyroid not enlarged, symmetric, no tenderness/mass/nodules Lungs: clear to auscultation bilaterally Heart: regular rate and rhythm Abdomen:  gravid, Non-tender Extremities: Homans sign is negative, no sign of DVT Skin: Skin color, texture, turgor normal. No rashes or lesions Neurologic: Grossly normal   Lab Results  Component Value Date   WBC 8.6 10/31/2020   HGB 13.6 10/31/2020   HCT 37.7 10/31/2020   MCV 87.5 10/31/2020   PLT 285 10/31/2020           ABO, Rh: O/Positive/-- (01/07 1004)  Antibody: Negative (01/07 1004)  Rubella: 3.90 (01/07 1004)  RPR: Non Reactive (05/11 1022)  HBsAg: Negative (01/07 1004)  HIV: Non Reactive (05/11 1022)  GBS:       Prenatal Transfer Tool  Maternal Diabetes: No Genetic Screening: Normal Maternal Ultrasounds/Referrals: Normal Fetal Ultrasounds or other Referrals:  None Maternal Substance Abuse:  No Significant Maternal Medications:  Meds include: Other: reglan, scopolamine Significant Maternal Lab Results: None   Assessment/Plan Principal Problem:   Hyperemesis  IVF repletion Scopolamine patch Clear liquid diet, advance as tolerated  Veronica Green 11/01/2020, 6:09 PM

## 2020-11-01 NOTE — MAU Note (Signed)
Ongoing vomiting x4days.  Tried to eat and drink this morning, came right back up.

## 2020-11-01 NOTE — MAU Provider Note (Signed)
Event Date/Time   First Provider Initiated Contact with Patient 11/01/20 1000      S [name] is a 40 y.o. [id number] patient who presents to MAU today with complaint of unrelieved emesis x 4 days. Patient denies tolerating PO at any point in the past 4 days. She states she was not PO challenged during her MAU episode early this morning. She requests IV fluids on arrival to MAU. She denies THC use within the past week.  She denies vaginal bleeding, leaking of fluid, decreased fetal movement, fever, falls, or recent illness.    O BP 109/86 (BP Location: Right Arm)   Pulse 77   Temp (!) 97.5 F (36.4 C) (Oral)   Resp 18   Ht 5\' 4"  (1.626 m)   Wt 58.3 kg   LMP 03/12/2020   SpO2 98%   BMI 22.06 kg/m    Physical Exam Vitals and nursing note reviewed. Exam conducted with a chaperone present.  Constitutional:      Appearance: She is toxic-appearing.  Abdominal:     Comments: Gravid  Skin:    Capillary Refill: Capillary refill takes less than 2 seconds.  Neurological:     Mental Status: She is alert and oriented to person, place, and time.  Psychiatric:        Mood and Affect: Mood normal.        Behavior: Behavior normal.        Judgment: Judgment normal.     A/P CNM at bedside on arrival due to vocalization from patient and audible moaning on arrival to MAU Medical screening exam complete  CNM explained notes from previous visit had been reviewed, patient did not necessarily need additional fluids, would need to be evaluated prior to ordering fluids Patient stable for traditional triage and assessment MSE report given to J. Rasch, NP  Darlina Rumpf, CNM 11/01/2020 10:52 AM

## 2020-11-01 NOTE — Plan of Care (Signed)
  Problem: Clinical Measurements: Goal: Ability to maintain clinical measurements within normal limits will improve Outcome: Progressing   Problem: Coping: Goal: Level of anxiety will decrease Outcome: Progressing   Problem: Education: Goal: Knowledge of disease or condition will improve Outcome: Progressing Goal: Knowledge of the prescribed therapeutic regimen will improve Outcome: Progressing   Problem: Clinical Measurements: Goal: Complications related to the disease process, condition or treatment will be avoided or minimized Outcome: Progressing

## 2020-11-01 NOTE — Discharge Instructions (Signed)
Morning Sickness  Morning sickness is when a woman feels nauseous during pregnancy. This nauseous feeling may or may not come with vomiting. It often occurs in the morning, but it can be a problem at any time of day. Morning sickness is most common during the first trimester. In some cases, it may continue throughout pregnancy. Although morning sickness is unpleasant, it is usually harmless unless the woman develops severe and continual vomiting (hyperemesis gravidarum), a condition that requires more intense treatment. What are the causes? The exact cause of this condition is not known, but it seems to be related to normal hormonal changes that occur in pregnancy. What increases the risk? You are more likely to develop this condition if:  You experienced nausea or vomiting before your pregnancy.  You had morning sickness during a previous pregnancy.  You are pregnant with more than one baby, such as twins. What are the signs or symptoms? Symptoms of this condition include:  Nausea.  Vomiting. How is this diagnosed? This condition is usually diagnosed based on your signs and symptoms. How is this treated? In many cases, treatment is not needed for this condition. Making some changes to what you eat may help to control symptoms. Your health care provider may also prescribe or recommend:  Vitamin B6 supplements.  Anti-nausea medicines.  Ginger. Follow these instructions at home: Medicines  Take over-the-counter and prescription medicines only as told by your health care provider. Do not use any prescription, over-the-counter, or herbal medicines for morning sickness without first talking with your health care provider.  Take multivitamins before getting pregnant. This can prevent or decrease the severity of morning sickness in most women. Eating and drinking  Eat a piece of dry toast or crackers before getting out of bed in the morning.  Eat 5 or 6 small meals a day.  Eat dry  and bland foods, such as rice or a baked potato. Foods that are high in carbohydrates are often helpful.  Avoid greasy, fatty, and spicy foods.  Have someone cook for you if the smell of any food causes nausea and vomiting.  If you feel nauseous after taking prenatal vitamins, take the vitamins at night or with a snack.  Eat a protein snack between meals if you are hungry. Nuts, yogurt, and cheese are good options.  Drink fluids throughout the day.  Try ginger ale made with real ginger, ginger tea made from fresh grated ginger, or ginger candies. General instructions  Do not use any products that contain nicotine or tobacco. These products include cigarettes, chewing tobacco, and vaping devices, such as e-cigarettes. If you need help quitting, ask your health care provider.  Get an air purifier to keep the air in your house free of odors.  Get plenty of fresh air.  Try to avoid odors that trigger your nausea.  Consider trying these methods to help relieve symptoms: ? Wearing an acupressure wristband. These wristbands are often worn for seasickness. ? Acupuncture. Contact a health care provider if:  Your home remedies are not working and you need medicine.  You feel dizzy or light-headed.  You are losing weight. Get help right away if:  You have persistent and uncontrolled nausea and vomiting.  You faint.  You have severe pain in your abdomen. Summary  Morning sickness is when a woman feels nauseous during pregnancy. This nauseous feeling may or may not come with vomiting.  Morning sickness is most common during the first trimester.  It often occurs in the  morning, but it can be a problem at any time of day.  In many cases, treatment is not needed for this condition. Making some changes to what you eat may help to control symptoms. This information is not intended to replace advice given to you by your health care provider. Make sure you discuss any questions you have  with your health care provider. Document Revised: 01/08/2020 Document Reviewed: 12/18/2019 Elsevier Patient Education  2021 [employer].

## 2020-11-02 LAB — BASIC METABOLIC PANEL
Anion gap: 12 (ref 5–15)
BUN: <5 mg/dL — ABNORMAL LOW (ref 6–20)
CO2: 13 mmol/L — ABNORMAL LOW (ref 22–32)
Calcium: 7.7 mg/dL — ABNORMAL LOW (ref 8.9–10.3)
Chloride: 108 mmol/L (ref 98–111)
Creatinine, Ser: 0.56 mg/dL (ref 0.44–1.00)
GFR, Estimated: >60 mL/min (ref >60)
Glucose, Bld: 60 mg/dL — ABNORMAL LOW (ref 70–99)
Potassium: 3.3 mmol/L — ABNORMAL LOW (ref 3.5–5.1)
Sodium: 133 mmol/L — ABNORMAL LOW (ref 135–145)

## 2020-11-02 LAB — COMPREHENSIVE METABOLIC PANEL
ALT: 11 U/L (ref 0–44)
AST: 18 U/L (ref 15–41)
Albumin: 2.6 g/dL — ABNORMAL LOW (ref 3.5–5.0)
Alkaline Phosphatase: 85 U/L (ref 38–126)
Anion gap: 11 (ref 5–15)
BUN: <5 mg/dL — ABNORMAL LOW (ref 6–20)
CO2: 16 mmol/L — ABNORMAL LOW (ref 22–32)
Calcium: 7.9 mg/dL — ABNORMAL LOW (ref 8.9–10.3)
Chloride: 107 mmol/L (ref 98–111)
Creatinine, Ser: 0.59 mg/dL (ref 0.44–1.00)
GFR, Estimated: >60 mL/min (ref >60)
Glucose, Bld: 69 mg/dL — ABNORMAL LOW (ref 70–99)
Potassium: 3.3 mmol/L — ABNORMAL LOW (ref 3.5–5.1)
Sodium: 134 mmol/L — ABNORMAL LOW (ref 135–145)
Total Bilirubin: 0.9 mg/dL (ref 0.3–1.2)
Total Protein: 5.4 g/dL — ABNORMAL LOW (ref 6.5–8.1)

## 2020-11-02 LAB — MAGNESIUM: Magnesium: 1.9 mg/dL (ref 1.7–2.4)

## 2020-11-02 MED ORDER — KCL-LACTATED RINGERS 20 MEQ/L IV SOLN
INTRAVENOUS | Status: DC
  Filled 2020-11-02: qty 1000

## 2020-11-02 MED ORDER — PANTOPRAZOLE SODIUM 40 MG IV SOLR
40.0000 mg | Freq: Two times a day (BID) | INTRAVENOUS | Status: DC
  Administered 2020-11-02 – 2020-11-07 (×6): 40 mg via INTRAVENOUS
  Filled 2020-11-02 (×10): qty 40

## 2020-11-02 MED ORDER — ONDANSETRON 4 MG PO TBDP
4.0000 mg | ORAL_TABLET | Freq: Four times a day (QID) | ORAL | Status: DC | PRN
  Administered 2020-11-02: 4 mg via ORAL
  Filled 2020-11-02: qty 1

## 2020-11-02 MED ORDER — MAGNESIUM SULFATE 2 GM/50ML IV SOLN
2.0000 g | Freq: Once | INTRAVENOUS | Status: AC
  Administered 2020-11-02: 2 g via INTRAVENOUS
  Filled 2020-11-02: qty 50

## 2020-11-02 MED ORDER — LORAZEPAM 0.5 MG PO TABS
0.5000 mg | ORAL_TABLET | Freq: Once | ORAL | Status: AC
  Administered 2020-11-02: 0.5 mg via ORAL
  Filled 2020-11-02: qty 1

## 2020-11-02 MED ORDER — SODIUM CHLORIDE 0.9 % IV SOLN
25.0000 mg | INTRAVENOUS | Status: DC
  Administered 2020-11-02: 25 mg via INTRAVENOUS
  Filled 2020-11-02: qty 1

## 2020-11-02 MED ORDER — DOCUSATE SODIUM 100 MG PO CAPS: 100.0000 mg | ORAL_CAPSULE | Freq: Every day | ORAL | 0 refills | Status: DC

## 2020-11-02 MED ORDER — SODIUM CHLORIDE 0.9 % IV SOLN
25.0000 mg | INTRAVENOUS | Status: DC
  Administered 2020-11-04: 25 mg via INTRAVENOUS
  Filled 2020-11-02 (×2): qty 1

## 2020-11-02 MED ORDER — PYRIDOXINE HCL 100 MG/ML IJ SOLN
100.0000 mg | Freq: Every day | INTRAMUSCULAR | Status: DC
  Administered 2020-11-02 – 2020-11-08 (×7): 100 mg via INTRAVENOUS
  Filled 2020-11-02 (×8): qty 1

## 2020-11-02 MED ORDER — POTASSIUM CHLORIDE 2 MEQ/ML IV SOLN
INTRAVENOUS | Status: DC
  Filled 2020-11-02 (×4): qty 1000

## 2020-11-02 NOTE — Progress Notes (Signed)
1153 pt called out complaining of panic attack due to notification of a death in family. Patient tearful and asked for Ativan. MD. Notified. Fetal monitors removed per patient request/refusal at 1202.

## 2020-11-02 NOTE — Discharge Summary (Addendum)
Antenatal Physician Discharge Summary  Patient ID: Veronica Green MRN: [id number] DOB/AGE: 39-16-1982 39 y.o.  Admit date: 11/01/2020 Discharge date:  11/08/2020 Admission Diagnoses:33.[redacted] weeks gestation with nausea and vomiting  Discharge Diagnoses: hyperemesis gravidarum  Prenatal Procedures: none  Consults: Neonatology, Maternal Fetal Medicine  Hospital Course:  Veronica Green is a 40 y.o. [id number] with IUP at 47w4dadmitted for hyperemesis.  She was given IVF and electrolyte repletion.  She was deemed stable for discharge to home with outpatient follow up.She required  Potassium supplementation due to level of 2.9 and Protonix was added to treat reflux. There was no sign of preterm labor and the fetal monitoring was reassuring. Zofran and phenergan were also used for nausea. The day of discharge she was tolerating her diet and she had not vomited the day before.  Discharge Exam: Temp:  [97.6 F (36.4 C)-98.1 F (36.7 C)] 97.6 F (36.4 C) (06/03 0432) Pulse Rate:  [87-106] 87 (06/03 0432) Resp:  [17-18] 18 (06/03 0432) BP: (106-135)/(70-78) 108/76 (06/03 0432) SpO2:  [99 %-100 %] 100 % (06/03 0432) Physical Examination: CONSTITUTIONAL: Well-developed, well-nourished female in no acute distress.  HENT:  Normocephalic, atraumatic, External right and left ear normal. Oropharynx is clear and moist EYES: Conjunctivae and EOM are normal. Pupils are equal, round, and reactive to light. No scleral icterus.  NECK: Normal range of motion, supple, no masses SKIN: Skin is warm and dry. No rash noted. Not diaphoretic. No erythema. No pallor. [place] Alert and oriented to person, place, and time. Normal reflexes, muscle tone coordination. No cranial nerve deficit noted. PSYCHIATRIC: Normal mood and affect. Normal behavior. Normal judgment and thought content. CARDIOVASCULAR: Normal heart rate noted, regular rhythm RESPIRATORY: Effort and breath sounds normal, no problems with respiration  noted MUSCULOSKELETAL: Normal range of motion. No edema and no tenderness. 2+ distal pulses. ABDOMEN: Soft, nontender, nondistended, gravid. CERVIX:    Fetal monitoring: FHR: 150 bpm, Variability: moderate, Accelerations: Present, Decelerations: Absent  Uterine activity: quiet   Significant Diagnostic Studies:  Results for orders placed or performed during the hospital encounter of 11/01/20 (from the past 168 hour(s))  Urinalysis, Routine w reflex microscopic Urine, Clean Catch   Collection Time: 11/01/20 10:00 AM  Result Value Ref Range   Color, Urine YELLOW YELLOW   APPearance HAZY (A) CLEAR   Specific Gravity, Urine 1.026 1.005 - 1.030   pH 5.0 5.0 - 8.0   Glucose, UA NEGATIVE NEGATIVE mg/dL   Hgb urine dipstick SMALL (A) NEGATIVE   Bilirubin Urine NEGATIVE NEGATIVE   Ketones, ur 80 (A) NEGATIVE mg/dL   Protein, ur 100 (A) NEGATIVE mg/dL   Nitrite NEGATIVE NEGATIVE   Leukocytes,Ua NEGATIVE NEGATIVE   RBC / HPF 0-5 0 - 5 RBC/hpf   WBC, UA 0-5 0 - 5 WBC/hpf   Bacteria, UA NONE SEEN NONE SEEN   Squamous Epithelial / LPF 0-5 0 - 5   Mucus PRESENT   SARS CORONAVIRUS 2 (TAT 6-24 HRS) Nasopharyngeal Nasopharyngeal Swab   Collection Time: 11/01/20  4:42 PM   Specimen: Nasopharyngeal Swab  Result Value Ref Range   SARS Coronavirus 2 NEGATIVE NEGATIVE  Type and screen [place]  Collection Time: 11/01/20  6:13 PM  Result Value Ref Range   ABO/RH(D) O POS    Antibody Screen NEG    Sample Expiration      11/04/2020,2359 [facility]formed [facility], [address] [place]  [id number]  Comprehensive metabolic panel   Collection Time: 11/02/20  11:33 AM  Result Value Ref Range   Sodium 134 (L) 135 - 145 mmol/L   Potassium 3.3 (L) 3.5 - 5.1 mmol/L   Chloride 107 98 - 111 mmol/L   CO2 16 (L) 22 - 32 mmol/L   Glucose, Bld 69 (L) 70 - 99 mg/dL   BUN <5 (L) 6 - 20 mg/dL   Creatinine, Ser 0.59 0.44 - 1.00 mg/dL   Calcium 7.9 (L) 8.9 - 10.3 mg/dL    Total Protein 5.4 (L) 6.5 - 8.1 g/dL   Albumin 2.6 (L) 3.5 - 5.0 g/dL   AST 18 15 - 41 U/L   ALT 11 0 - 44 U/L   Alkaline Phosphatase 85 38 - 126 U/L   Total Bilirubin 0.9 0.3 - 1.2 mg/dL   GFR, Estimated >60 >60 mL/min   Anion gap 11 5 - 15  Basic metabolic panel   Collection Time: 11/02/20  8:13 PM  Result Value Ref Range   Sodium 133 (L) 135 - 145 mmol/L   Potassium 3.3 (L) 3.5 - 5.1 mmol/L   Chloride 108 98 - 111 mmol/L   CO2 13 (L) 22 - 32 mmol/L   Glucose, Bld 60 (L) 70 - 99 mg/dL   BUN <5 (L) 6 - 20 mg/dL   Creatinine, Ser 0.56 0.44 - 1.00 mg/dL   Calcium 7.7 (L) 8.9 - 10.3 mg/dL   GFR, Estimated >60 >60 mL/min   Anion gap 12 5 - 15  Magnesium   Collection Time: 11/02/20  8:13 PM  Result Value Ref Range   Magnesium 1.9 1.7 - 2.4 mg/dL  Basic metabolic panel   Collection Time: 11/03/20  7:32 AM  Result Value Ref Range   Sodium 133 (L) 135 - 145 mmol/L   Potassium 3.6 3.5 - 5.1 mmol/L   Chloride 111 98 - 111 mmol/L   CO2 12 (L) 22 - 32 mmol/L   Glucose, Bld 57 (L) 70 - 99 mg/dL   BUN <5 (L) 6 - 20 mg/dL   Creatinine, Ser 0.70 0.44 - 1.00 mg/dL   Calcium 7.9 (L) 8.9 - 10.3 mg/dL   GFR, Estimated >60 >60 mL/min   Anion gap 10 5 - 15  Comprehensive metabolic panel   Collection Time: 11/06/20  4:44 AM  Result Value Ref Range   Sodium 131 (L) 135 - 145 mmol/L   Potassium 3.1 (L) 3.5 - 5.1 mmol/L   Chloride 105 98 - 111 mmol/L   CO2 12 (L) 22 - 32 mmol/L   Glucose, Bld 60 (L) 70 - 99 mg/dL   BUN <5 (L) 6 - 20 mg/dL   Creatinine, Ser 0.70 0.44 - 1.00 mg/dL   Calcium 8.1 (L) 8.9 - 10.3 mg/dL   Total Protein 5.4 (L) 6.5 - 8.1 g/dL   Albumin 2.6 (L) 3.5 - 5.0 g/dL   AST 18 15 - 41 U/L   ALT 11 0 - 44 U/L   Alkaline Phosphatase 95 38 - 126 U/L   Total Bilirubin 1.0 0.3 - 1.2 mg/dL   GFR, Estimated >60 >60 mL/min   Anion gap 14 5 - 15  Magnesium   Collection Time: 11/06/20  4:44 AM  Result Value Ref Range   Magnesium 1.6 (L) 1.7 - 2.4 mg/dL  Basic metabolic panel    Collection Time: 11/07/20  2:13 PM  Result Value Ref Range   Sodium 134 (L) 135 - 145 mmol/L   Potassium 2.9 (L) 3.5 - 5.1 mmol/L   Chloride 109 98 -  111 mmol/L   CO2 17 (L) 22 - 32 mmol/L   Glucose, Bld 75 70 - 99 mg/dL   BUN <5 (L) 6 - 20 mg/dL   Creatinine, Ser 0.65 0.44 - 1.00 mg/dL   Calcium 8.1 (L) 8.9 - 10.3 mg/dL   GFR, Estimated >60 >60 mL/min   Anion gap 8 5 - 15   [place] MFM OB FOLLOW UP  Result Date: 10/25/2020 ----------------------------------------------------------------------  OBSTETRICS REPORT                       (Signed Final 10/25/2020 10:35 am) ---------------------------------------------------------------------- Patient Info  ID #:       [id number]                          D.O.B.:  13-Oct-1980 (39 yrs)  Name:       [name]                    Visit Date: 10/25/2020 09:45 am ---------------------------------------------------------------------- Performed By  Attending:        Sander Nephew      Ref. Address:     [place]                                                             [address] [place]                                                             [place]  Performed By:     Germain Osgood            [facility]  Referred By:      [facility] ---------------------------------------------------------------------- Orders  #  Description                           Code        Ordered By  1  [place] MFM OB FOLLOW UP                   [phone]    RAVI Donalee Citrin ----------------------------------------------------------------------  #  Order #                     Accession #                 Episode #  1  [id number]                   [id number]                 [id number] ---------------------------------------------------------------------- Indications  Advanced maternal age multigravida 22+,        O62.522  second trimester  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 Cord Insertion:      Visualized, central  Amniotic Fluid  AFI FV:      Within normal limits  AFI Sum(cm)     %Tile       Largest Pocket(cm)  10.17           18          3.59  RUQ(cm)       RLQ(cm)       LUQ(cm)        LLQ(cm)  0.95          3.54          2.09           3.59 ---------------------------------------------------------------------- Biometry  BPD:      87.9  mm     G. Age:  35w 4d         99  %    CI:        77.62   %    70 - 86  FL/HC:      18.3   %    19.1 - 21.3  HC:      315.8  mm     G. Age:  35w 3d         88  %    HC/AC:      1.02        0.96 - 1.17  AC:      310.9  mm     G. Age:  35w 0d         98  %    FL/BPD:     65.8   %    71 - 87  FL:       57.8  mm     G. Age:  30w 2d        2.6  %    FL/AC:      18.6   %    20 - 24  LV:        2.7  mm  Est. FW:    2284  gm      5 lb 1 oz     82  % ---------------------------------------------------------------------- OB History  Gravidity:    9         Term:   5  Living:       5 ---------------------------------------------------------------------- Gestational Age  LMP:           32w 3d        Date:  03/12/20                 EDD:   12/17/20  U/S Today:     34w 1d                                        EDD:   12/05/20  Best:          32w  3d     Det. By:  LMP  (03/12/20)          EDD:   12/17/20 ---------------------------------------------------------------------- Anatomy  Cranium:               Appears normal         LVOT:                   Previously seen  Cavum:                 Appears normal         Aortic Arch:            Previously seen  Ventricles:            Appears normal         Ductal Arch:            Previously seen  Choroid Plexus:        Previously seen        Diaphragm:              Previously seen  Cerebellum:            Previously seen        Stomach:                Appears normal, left  sided  Posterior Fossa:       Previously seen        Abdomen:                Appears normal  Nuchal Fold:           Not applicable (>55    Abdominal Wall:         Previously seen                         wks GA)  Face:                  Orbits and profile     Cord Vessels:           Previously seen                         previously seen  Lips:                  Previously seen        Kidneys:                Appear normal  Palate:                Previously seen        Bladder:                Appears normal  Thoracic:              Previously seen        Spine:                  Previously seen  Heart:                 Appears normal         Upper Extremities:      Previously seen                         (4CH, axis, and                         situs)  RVOT:                  Previously seen        Lower Extremities:      Previously seen  Other:  Female gender previously seen. Heels/feet and open hands/5th digits          prev visualized. VC, 3VV and 3VTV prev visualized. ---------------------------------------------------------------------- Cervix Uterus Adnexa  Cervix  Not visualized (advanced GA >24wks)  Uterus  No abnormality visualized.  Right Ovary  Not visualized.  Left Ovary  Not visualized.  Cul De Sac  No free fluid seen.  Adnexa  No adnexal mass visualized.  ---------------------------------------------------------------------- Impression  Follow up growth due to AMA  Normal interval growth with measurements consistent with  dates  Good fetal movement and amniotic fluid volume ---------------------------------------------------------------------- Recommendations  Follow up growth as clinically indicated. ----------------------------------------------------------------------               Sander Nephew, MD Electronically Signed Final Report   10/25/2020 10:35 am ----------------------------------------------------------------------   No future appointments.  Discharge Condition: Stable  Discharge disposition: 01-Home or Self Care       Discharge Instructions    Discharge activity:  Bathroom / Shower only   Complete by: As directed  Discharge activity: Bedrest   Complete by: As directed    Discharge diet:  No restrictions   Complete by: As directed    Discharge patient   Complete by: As directed    Discharge disposition: 01-Home or Self Care   Discharge patient date: 11/01/2020   Discharge patient   Complete by: As directed    Discharge disposition: 01-Home or Self Care   Discharge patient date: 11/08/2020   Fetal Kick Count:  Lie on our left side for one hour after a meal, and count the number of times your baby kicks.  If it is less than 5 times, get up, move around and drink some juice.  Repeat the test 30 minutes later.  If it is still less than 5 kicks in an hour, notify your doctor.   Complete by: As directed      Allergies as of 11/08/2020   No Known Allergies     Medication List    STOP taking these medications   aspirin EC 81 MG tablet   Prenatal 19 29-1 MG Tabs     TAKE these medications   Blood Pressure Monitor Kit 1 Device by Does not apply route once a week. To be monitored Regularly at home.   docusate sodium 100 MG capsule Commonly known as: COLACE Take 1 capsule (100 mg total) by mouth daily.    hydrOXYzine 25 MG tablet Commonly known as: ATARAX/VISTARIL Take 1 tablet (25 mg total) by mouth every 6 (six) hours.   metoCLOPramide 10 MG tablet Commonly known as: REGLAN Take 1 tablet (10 mg total) by mouth 3 (three) times daily with meals.   ondansetron 8 MG disintegrating tablet Commonly known as: Zofran ODT Take 1 tablet (8 mg total) by mouth every 8 (eight) hours as needed for nausea or vomiting.   ondansetron 4 MG disintegrating tablet Commonly known as: ZOFRAN-ODT Take 1 tablet (4 mg total) by mouth every 6 (six) hours as needed for nausea or vomiting.   pantoprazole 20 MG tablet Commonly known as: PROTONIX Take 1 tablet (20 mg total) by mouth 2 (two) times daily.   potassium chloride 10 MEQ tablet Commonly known as: KLOR-CON Take 1 tablet (10 mEq total) by mouth 2 (two) times daily. What changed: when to take this   PrePLUS 27-1 MG Tabs Take 1 tablet by mouth daily.   promethazine 25 MG tablet Commonly known as: PHENERGAN Take 1 tablet (25 mg total) by mouth every 6 (six) hours as needed for nausea or vomiting.   scopolamine 1 MG/3DAYS Commonly known as: TRANSDERM-SCOP Place 1 patch (1.5 mg total) onto the skin every 3 (three) days.       Follow-up Information    [facility] Follow up.   Specialty: Obstetrics and Gynecology Why: as needed, if symptoms worsen  Contact information: [address] [id number] [place]       [facility] Follow up.   Contact information: [place] [address] [phone]              Total discharge time: 20 minutes   Signed: Emeterio Reeve M.D. 11/08/2020, 7:14 AM

## 2020-11-03 DIAGNOSIS — R1110 Vomiting, unspecified: Secondary | ICD-10-CM | POA: Diagnosis not present

## 2020-11-03 LAB — BASIC METABOLIC PANEL
Anion gap: 10 (ref 5–15)
BUN: <5 mg/dL — ABNORMAL LOW (ref 6–20)
CO2: 12 mmol/L — ABNORMAL LOW (ref 22–32)
Calcium: 7.9 mg/dL — ABNORMAL LOW (ref 8.9–10.3)
Chloride: 111 mmol/L (ref 98–111)
Creatinine, Ser: 0.7 mg/dL (ref 0.44–1.00)
GFR, Estimated: >60 mL/min (ref >60)
Glucose, Bld: 57 mg/dL — ABNORMAL LOW (ref 70–99)
Potassium: 3.6 mmol/L (ref 3.5–5.1)
Sodium: 133 mmol/L — ABNORMAL LOW (ref 135–145)

## 2020-11-03 MED ORDER — ONDANSETRON 4 MG PO TBDP
4.0000 mg | ORAL_TABLET | Freq: Four times a day (QID) | ORAL | Status: DC
  Administered 2020-11-03 – 2020-11-07 (×12): 4 mg via ORAL
  Filled 2020-11-03 (×15): qty 1

## 2020-11-03 MED ORDER — ENSURE ENLIVE PO LIQD
237.0000 mL | Freq: Two times a day (BID) | ORAL | Status: AC
  Administered 2020-11-03 – 2020-11-04 (×3): 237 mL via ORAL
  Filled 2020-11-03 (×6): qty 237

## 2020-11-03 MED ORDER — HALOPERIDOL LACTATE 5 MG/ML IJ SOLN
2.0000 mg | Freq: Four times a day (QID) | INTRAMUSCULAR | Status: DC | PRN
  Administered 2020-11-03 – 2020-11-04 (×2): 2 mg via INTRAVENOUS
  Filled 2020-11-03 (×3): qty 0.4

## 2020-11-03 NOTE — Progress Notes (Signed)
Patient ID: Veronica Green, female   DOB: 27-Jan-1981, 40 y.o.   MRN: [id number] [place]) NOTE  Veronica Green is a 41 y.o. [id number] [redacted]w[redacted]d  who is admitted for hyperemesis.    Fetal presentation is unsure. Length of Stay:  1  Days  Date of admission:11/01/2020  Subjective: Patient reports persistent episodes of emesis but seems to be improving. She has not been able to tolerate any clears diet Patient reports the fetal movement as active. Patient reports uterine contraction  activity as none. Patient reports  vaginal bleeding as none. Patient describes fluid per vagina as None.  Vitals:  Blood pressure 117/69, pulse 89, temperature 97.9 F (36.6 C), temperature source Oral, resp. rate 18, height 5\' 4"  (1.626 m), weight 58.3 kg, last menstrual period 03/12/2020, SpO2 98 %, unknown if currently breastfeeding. Vitals:   11/02/20 1647 11/02/20 2221 11/03/20 0444 11/03/20 0445  BP: (!) 128/52 129/70 117/69   Pulse: 79 87 89   Resp: 18 18 18    Temp: 98.2 F (36.8 C) 99.2 F (37.3 C) 97.9 F (36.6 C)   TempSrc: Oral Oral Oral   SpO2: 100% 100% 98% 98%  Weight:      Height:       Physical Examination: GENERAL: Well-developed, well-nourished female in no acute distress.  LUNGS: Clear to auscultation bilaterally.  HEART: Regular rate and rhythm. ABDOMEN: Soft, nontender, gravid PELVIC: Not indicated EXTREMITIES: No cyanosis, clubbing, or edema, 2+ distal pulses.   Fetal Monitoring:  Baseline: 140 bpm, Variability: Good {> 6 bpm), Accelerations: Reactive and Decelerations: Absent   reactive  Labs:  Results for orders placed or performed during the hospital encounter of 11/01/20 (from the past 24 hour(s))  Comprehensive metabolic panel   Collection Time: 11/02/20 11:33 AM  Result Value Ref Range   Sodium 134 (L) 135 - 145 mmol/L   Potassium 3.3 (L) 3.5 - 5.1 mmol/L   Chloride 107 98 - 111 mmol/L   CO2 16 (L) 22 - 32 mmol/L   Glucose, Bld 69 (L) 70 - 99  mg/dL   BUN <5 (L) 6 - 20 mg/dL   Creatinine, Ser 0.59 0.44 - 1.00 mg/dL   Calcium 7.9 (L) 8.9 - 10.3 mg/dL   Total Protein 5.4 (L) 6.5 - 8.1 g/dL   Albumin 2.6 (L) 3.5 - 5.0 g/dL   AST 18 15 - 41 U/L   ALT 11 0 - 44 U/L   Alkaline Phosphatase 85 38 - 126 U/L   Total Bilirubin 0.9 0.3 - 1.2 mg/dL   GFR, Estimated >60 >60 mL/min   Anion gap 11 5 - 15  Basic metabolic panel   Collection Time: 11/02/20  8:13 PM  Result Value Ref Range   Sodium 133 (L) 135 - 145 mmol/L   Potassium 3.3 (L) 3.5 - 5.1 mmol/L   Chloride 108 98 - 111 mmol/L   CO2 13 (L) 22 - 32 mmol/L   Glucose, Bld 60 (L) 70 - 99 mg/dL   BUN <5 (L) 6 - 20 mg/dL   Creatinine, Ser 0.56 0.44 - 1.00 mg/dL   Calcium 7.7 (L) 8.9 - 10.3 mg/dL   GFR, Estimated >60 >60 mL/min   Anion gap 12 5 - 15  Magnesium   Collection Time: 11/02/20  8:13 PM  Result Value Ref Range   Magnesium 1.9 1.7 - 2.4 mg/dL    Imaging Studies:    No results found.   Medications:  Scheduled . docusate sodium  100 mg Oral Daily  .  pantoprazole (PROTONIX) IV  40 mg Intravenous Q12H  . prenatal multivitamin  1 tablet Oral Q1200  . pyridOXINE  100 mg Intravenous Daily  . scopolamine  1 patch Transdermal Q72H   I have reviewed the patient's current medications.  ASSESSMENT:  Patient Active Problem List   Diagnosis Date Noted  . Hyperemesis 11/01/2020  . 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 AMA (advanced maternal age) multigravida 35+ 06/14/2020  . Supervision of high risk pregnancy, antepartum 06/06/2020  . COVID-19 05/15/2019  . Hypokalemia 05/14/2019  . QT prolongation 05/14/2019  . 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 Unwanted fertility 08/11/2016    PLAN: 40 yo P5025 at [redacted]w[redacted]d with third trimester hyperemesis and hypokalemia - Continue IV hydration - Continue antiemetics - Continue electrolyte corrections - Encouraged patient to continue trial of oral intake - Continue current care  Arabela Basaldua 11/03/2020,6:55 AM

## 2020-11-03 NOTE — Progress Notes (Signed)
During NST, RN auscultated what sounded like a gallop in fetal heart sounds. 2nd Designer, multimedia consulted and agreed. Dr. Kennon Rounds made aware at 1200.

## 2020-11-04 DIAGNOSIS — O211 Hyperemesis gravidarum with metabolic disturbance: Secondary | ICD-10-CM | POA: Diagnosis not present

## 2020-11-04 DIAGNOSIS — Z20822 Contact with and (suspected) exposure to covid-19: Secondary | ICD-10-CM | POA: Diagnosis not present

## 2020-11-04 DIAGNOSIS — D573 Sickle-cell trait: Secondary | ICD-10-CM | POA: Diagnosis not present

## 2020-11-04 DIAGNOSIS — Z8616 Personal history of COVID-19: Secondary | ICD-10-CM | POA: Diagnosis not present

## 2020-11-04 MED ORDER — SODIUM CHLORIDE 0.9 % IV SOLN
25.0000 mg | INTRAVENOUS | Status: DC
  Administered 2020-11-04 – 2020-11-07 (×7): 25 mg via INTRAVENOUS
  Filled 2020-11-04 (×7): qty 1

## 2020-11-04 MED ORDER — LACTATED RINGERS IV SOLN: INTRAVENOUS | Status: DC

## 2020-11-04 NOTE — Progress Notes (Signed)
Patient ID: Veronica Green, female   DOB: November 21, 1980, 40 y.o.   MRN: [id number] [place]) NOTE  Veronica Green is a 40 y.o. [id number] at [redacted]w[redacted]d by best clinical estimate who is admitted for hyperemesis.   Fetal presentation is cephalic. Length of Stay:  2  Days  ASSESSMENT: Principal Problem:   Hyperemesis   PLAN: Continue inpt. Monitoring IV fluid hydration Anti-emetics Slowly advance diet DC once episode resolves and able to tolerate po.  Subjective: Still with emesis overnight while trying to drink Ensure Patient reports the fetal movement as decreased . Patient reports uterine contraction  activity as none. Patient reports  vaginal bleeding as none. Patient describes fluid per vagina as None.  Vitals:  Blood pressure 129/65, pulse 89, temperature 98.8 F (37.1 C), temperature source Oral, resp. rate 17, height 5\' 4"  (1.626 m), weight 58.3 kg, last menstrual period 03/12/2020, SpO2 98 %, unknown if currently breastfeeding. Physical Examination:  General appearance - alert, well appearing, and in no distress Chest - normal effort Abdomen - gravid, non-tender Fundal Height:  size equals dates Extremities: Homans sign is negative, no sign of DVT  Membranes:intact  Fetal Monitoring:  Baseline: 135 bpm, Variability: Good {> 6 bpm), Accelerations: Reactive and Decelerations: Absent  Labs:  Results for orders placed or performed during the hospital encounter of 11/01/20 (from the past 24 hour(s))  Basic metabolic panel   Collection Time: 11/03/20  7:32 AM  Result Value Ref Range   Sodium 133 (L) 135 - 145 mmol/L   Potassium 3.6 3.5 - 5.1 mmol/L   Chloride 111 98 - 111 mmol/L   CO2 12 (L) 22 - 32 mmol/L   Glucose, Bld 57 (L) 70 - 99 mg/dL   BUN <5 (L) 6 - 20 mg/dL   Creatinine, Ser 0.70 0.44 - 1.00 mg/dL   Calcium 7.9 (L) 8.9 - 10.3 mg/dL   GFR, Estimated >60 >60 mL/min   Anion gap 10 5 - 15    Medications:  Scheduled . docusate sodium  100 mg  Oral Daily  . feeding supplement  237 mL Oral BID BM  . ondansetron  4 mg Oral Q6H  . pantoprazole (PROTONIX) IV  40 mg Intravenous Q12H  . prenatal multivitamin  1 tablet Oral Q1200  . pyridOXINE  100 mg Intravenous Daily  . scopolamine  1 patch Transdermal Q72H   I have reviewed the patient's current medications.   Donnamae Jude, MD 11/04/2020,7:04 AM

## 2020-11-05 DIAGNOSIS — Z3A34 34 weeks gestation of pregnancy: Secondary | ICD-10-CM | POA: Diagnosis not present

## 2020-11-05 DIAGNOSIS — O212 Late vomiting of pregnancy: Secondary | ICD-10-CM | POA: Diagnosis not present

## 2020-11-05 NOTE — Progress Notes (Signed)
[place] NOTE  Veronica Green is a 40 y.o. [id number] at [redacted]w[redacted]d who is admitted for hyperemesis.  Estimated Date of Delivery: 12/17/20 Fetal presentation is unsure.  Length of Stay:  3 Days. Admitted 11/01/2020  Subjective: Patient reports she is having significant heartburn with all medications and PO intake and this is making her nauseous. Reports she has been refusing some meds because they are giving her more heartburn and only the tums are working. Was able to take some vanilla ensure but hospital ran out and the chocolate "doesn't agree with her." States she would like to try the phenergan for now and see how she does. Patient reports normal fetal movement.  She denies uterine contractions, denies bleeding and leaking of fluid per vagina.  Vitals:  Blood pressure 131/75, pulse 94, temperature 98 F (36.7 C), temperature source Oral, resp. rate 18, height 5\' 4"  (1.626 m), weight 58.3 kg, last menstrual period 03/12/2020, SpO2 100 %, unknown if currently breastfeeding. Physical Examination: CONSTITUTIONAL: Well-developed female in no acute distress. Appears very fatigued SKIN: Skin is warm and dry. No rash noted. Not diaphoretic. No erythema. No pallor. [place]: Alert and oriented to person, place, and time. Normal reflexes, muscle tone coordination. No cranial nerve deficit noted. PSYCHIATRIC: Normal mood and affect. Normal behavior. Normal judgment and thought content. CARDIOVASCULAR: Normal heart rate noted RESPIRATORY: Effort normal, no problems with respiration noted MUSCULOSKELETAL: Normal range of motion. No edema and no tenderness. ABDOMEN: Soft, nontender, nondistended, gravid. CERVIX: deferred  Fetal monitoring: FHR: 145 bpm, Variability: moderate, Accelerations: Present, Decelerations: Absent  Uterine activity: no contractions per hour  No results found for this or any previous visit (from the past 48 hour(s)).  I have reviewed the patient's current  medications.  ASSESSMENT: Principal Problem:   Hyperemesis   PLAN: Reviewed with patient that protonix is best utilized for heartburn when taken regularly, not as needed. Will see if she improves with phenergan. Encouraged PO intake as able. Reviewed cannot induce her early at this point due to hyperemesis. She verbalized understanding of the above.  Subsequently, see in EMR that patient allowed for am admin of protonix and B6.  Cont meds as scheduled Repeat CMP/mag in am Increase PO intake as tolerated   Continue routine antenatal care.   Feliz Beam, MD, [place] [facility] [employer] (Faculty Practice)  11/05/2020 10:48 AM

## 2020-11-06 DIAGNOSIS — Z3A34 34 weeks gestation of pregnancy: Secondary | ICD-10-CM | POA: Diagnosis not present

## 2020-11-06 DIAGNOSIS — O212 Late vomiting of pregnancy: Secondary | ICD-10-CM | POA: Diagnosis not present

## 2020-11-06 LAB — COMPREHENSIVE METABOLIC PANEL
ALT: 11 U/L (ref 0–44)
AST: 18 U/L (ref 15–41)
Albumin: 2.6 g/dL — ABNORMAL LOW (ref 3.5–5.0)
Alkaline Phosphatase: 95 U/L (ref 38–126)
Anion gap: 14 (ref 5–15)
BUN: <5 mg/dL — ABNORMAL LOW (ref 6–20)
CO2: 12 mmol/L — ABNORMAL LOW (ref 22–32)
Calcium: 8.1 mg/dL — ABNORMAL LOW (ref 8.9–10.3)
Chloride: 105 mmol/L (ref 98–111)
Creatinine, Ser: 0.7 mg/dL (ref 0.44–1.00)
GFR, Estimated: >60 mL/min (ref >60)
Glucose, Bld: 60 mg/dL — ABNORMAL LOW (ref 70–99)
Potassium: 3.1 mmol/L — ABNORMAL LOW (ref 3.5–5.1)
Sodium: 131 mmol/L — ABNORMAL LOW (ref 135–145)
Total Bilirubin: 1 mg/dL (ref 0.3–1.2)
Total Protein: 5.4 g/dL — ABNORMAL LOW (ref 6.5–8.1)

## 2020-11-06 LAB — MAGNESIUM: Magnesium: 1.6 mg/dL — ABNORMAL LOW (ref 1.7–2.4)

## 2020-11-06 MED ORDER — POTASSIUM CHLORIDE CRYS ER 20 MEQ PO TBCR
20.0000 meq | EXTENDED_RELEASE_TABLET | Freq: Two times a day (BID) | ORAL | Status: DC
  Administered 2020-11-06 – 2020-11-08 (×5): 20 meq via ORAL
  Filled 2020-11-06 (×5): qty 1

## 2020-11-06 MED ORDER — ENSURE ENLIVE PO LIQD
237.0000 mL | Freq: Two times a day (BID) | ORAL | Status: AC
  Administered 2020-11-06: 237 mL via ORAL
  Filled 2020-11-06 (×4): qty 237

## 2020-11-06 MED ORDER — MAGNESIUM OXIDE -MG SUPPLEMENT 400 (240 MG) MG PO TABS
200.0000 mg | ORAL_TABLET | Freq: Two times a day (BID) | ORAL | Status: AC
  Administered 2020-11-06 (×2): 200 mg via ORAL
  Filled 2020-11-06 (×2): qty 1

## 2020-11-06 NOTE — Progress Notes (Signed)
[place] NOTE  Veronica Green is a 40 y.o. [id number] at [redacted]w[redacted]d who is admitted for hyperemesis.  Estimated Date of Delivery: 12/17/20 Fetal presentation is unsure.  Length of Stay:  4 Days. Admitted 11/01/2020  Subjective: Patient reports tolerating a few carrots last night for dinner. Nausea improved overnight and she has ordered breakfast to see how it goes. Still does not feel good. Patient reports normal fetal movement.  She reports occasional contractions, denies bleeding and leaking of fluid per vagina.  Vitals:  Blood pressure (!) 106/59, pulse 99, temperature 97.7 F (36.5 C), temperature source Oral, resp. rate 16, height 5\' 4"  (1.626 m), weight 58.3 kg, last menstrual period 03/12/2020, SpO2 99 %, unknown if currently breastfeeding. Physical Examination: CONSTITUTIONAL: Well-developed, well-nourished female in no acute distress. Appears fatigued SKIN: Skin is warm and dry. No rash noted. Not diaphoretic. No erythema. No pallor. [place]: Alert and oriented to person, place, and time. Normal reflexes, muscle tone coordination. No cranial nerve deficit noted. PSYCHIATRIC: Normal mood and affect. Normal behavior. Normal judgment and thought content. CARDIOVASCULAR: Normal heart rate noted RESPIRATORY: Effort normal, no problems with respiration noted MUSCULOSKELETAL: Normal range of motion. No edema and no tenderness. ABDOMEN: Soft, nontender, nondistended, gravid. CERVIX: deferred  Fetal monitoring: FHR: 140 bpm, Variability: moderate, Accelerations: Present, Decelerations: Absent  Uterine activity: no contractions per hour  Results for orders placed or performed during the hospital encounter of 11/01/20 (from the past 48 hour(s))  Comprehensive metabolic panel     Status: Abnormal   Collection Time: 11/06/20  4:44 AM  Result Value Ref Range   Sodium 131 (L) 135 - 145 mmol/L   Potassium 3.1 (L) 3.5 - 5.1 mmol/L   Chloride 105 98 - 111 mmol/L   CO2 12 (L)  22 - 32 mmol/L   Glucose, Bld 60 (L) 70 - 99 mg/dL    Comment: Glucose reference range applies only to samples taken after fasting for at least 8 hours.   BUN <5 (L) 6 - 20 mg/dL   Creatinine, Ser 0.70 0.44 - 1.00 mg/dL   Calcium 8.1 (L) 8.9 - 10.3 mg/dL   Total Protein 5.4 (L) 6.5 - 8.1 g/dL   Albumin 2.6 (L) 3.5 - 5.0 g/dL   AST 18 15 - 41 U/L   ALT 11 0 - 44 U/L   Alkaline Phosphatase 95 38 - 126 U/L   Total Bilirubin 1.0 0.3 - 1.2 mg/dL   GFR, Estimated >60 >60 mL/min    Comment: (NOTE) Calculated using the CKD-EPI Creatinine Equation (2021)    Anion gap 14 5 - 15    Comment: Performed at [facility], [place] [address], [place], [place] [address]  Magnesium     Status: Abnormal   Collection Time: 11/06/20  4:44 AM  Result Value Ref Range   Magnesium 1.6 (L) 1.7 - 2.4 mg/dL    Comment: Performed at [facility] [address], [place], [place] [address]    I have reviewed the patient's current medications.  ASSESSMENT: Principal Problem:   Hyperemesis   PLAN: - tolerated small amount PO last night, will see how she does today - replete K/Mag today as ordered - encouraged her to eat salty foods, slightly hyponatremic - cont scheduled anti-emetics - possible discharge home late today or tomorrow   Continue routine antenatal care.   Feliz Beam, MD, [place] [facility] [employer] (Faculty Practice)  11/06/2020 11:02 AM

## 2020-11-07 ENCOUNTER — Encounter: Payer: Medicaid Other | Admitting: Obstetrics & Gynecology

## 2020-11-07 DIAGNOSIS — O99283 Endocrine, nutritional and metabolic diseases complicating pregnancy, third trimester: Secondary | ICD-10-CM

## 2020-11-07 DIAGNOSIS — E876 Hypokalemia: Secondary | ICD-10-CM

## 2020-11-07 DIAGNOSIS — O212 Late vomiting of pregnancy: Secondary | ICD-10-CM | POA: Diagnosis not present

## 2020-11-07 DIAGNOSIS — Z3A34 34 weeks gestation of pregnancy: Secondary | ICD-10-CM | POA: Diagnosis not present

## 2020-11-07 LAB — BASIC METABOLIC PANEL
Anion gap: 8 (ref 5–15)
BUN: <5 mg/dL — ABNORMAL LOW (ref 6–20)
CO2: 17 mmol/L — ABNORMAL LOW (ref 22–32)
Calcium: 8.1 mg/dL — ABNORMAL LOW (ref 8.9–10.3)
Chloride: 109 mmol/L (ref 98–111)
Creatinine, Ser: 0.65 mg/dL (ref 0.44–1.00)
GFR, Estimated: >60 mL/min (ref >60)
Glucose, Bld: 75 mg/dL (ref 70–99)
Potassium: 2.9 mmol/L — ABNORMAL LOW (ref 3.5–5.1)
Sodium: 134 mmol/L — ABNORMAL LOW (ref 135–145)

## 2020-11-07 MED ORDER — PROMETHAZINE HCL 25 MG PO TABS: 25.0000 mg | ORAL_TABLET | Freq: Four times a day (QID) | ORAL | Status: DC | PRN

## 2020-11-07 MED ORDER — ONDANSETRON 4 MG PO TBDP: 4.0000 mg | ORAL_TABLET | Freq: Four times a day (QID) | ORAL | Status: DC | PRN

## 2020-11-07 MED ORDER — PANTOPRAZOLE SODIUM 20 MG PO TBEC
20.0000 mg | DELAYED_RELEASE_TABLET | Freq: Two times a day (BID) | ORAL | Status: DC
  Filled 2020-11-07: qty 1

## 2020-11-07 NOTE — Progress Notes (Signed)
Patient ID: Veronica Green, female   DOB: 01-20-1981, 40 y.o.   MRN: [id number] [place] COMPREHENSIVE PROGRESS NOTE  Veronica Green is a 40 y.o. [id number] at [redacted]w[redacted]d  who is admitted for hyperemesis.   Fetal presentation is unsure. Length of Stay:  5  Days  Subjective: Pt reports several episodes of N/V last night. Still does not feel good. Patient reports good fetal movement.  She reports occ uterine contractions, no bleeding and no loss of fluid per vagina.  Vitals:  Blood pressure 122/70, pulse 88, temperature 98.1 F (36.7 C), temperature source Oral, resp. rate 18, height 5\' 4"  (1.626 m), weight 58.3 kg, last menstrual period 03/12/2020, SpO2 99 %, unknown if currently breastfeeding.   Physical Examination: Lungs clear Heart RRR Abd soft + BS gravid Ext non tender  Fetal Monitoring:  Baseline: 140-150's, + accels, no decels, occ ut ctx bpm  Labs:  No results found for this or any previous visit (from the past 24 hour(s)).  Imaging Studies:    NA   Medications:  Scheduled . docusate sodium  100 mg Oral Daily  . feeding supplement  237 mL Oral BID BM  . ondansetron  4 mg Oral Q6H  . pantoprazole (PROTONIX) IV  40 mg Intravenous Q12H  . potassium chloride  20 mEq Oral BID  . prenatal multivitamin  1 tablet Oral Q1200  . pyridOXINE  100 mg Intravenous Daily  . scopolamine  1 patch Transdermal Q72H   I have reviewed the patient's current medications.  ASSESSMENT: IUP 34 2/7 weeks Hyperemesis Hypokalemia Hypomagnesemia   PLAN: Will decreased diet to full liquids. Continue with electrolyte replaced as needed. Scheduled antiemetics.  Continue routine antenatal care.   Veronica Green 11/07/2020,8:19 AM

## 2020-11-08 DIAGNOSIS — R1110 Vomiting, unspecified: Secondary | ICD-10-CM

## 2020-11-08 LAB — BASIC METABOLIC PANEL
Anion gap: 6 (ref 5–15)
BUN: <5 mg/dL — ABNORMAL LOW (ref 6–20)
CO2: 21 mmol/L — ABNORMAL LOW (ref 22–32)
Calcium: 7.9 mg/dL — ABNORMAL LOW (ref 8.9–10.3)
Chloride: 108 mmol/L (ref 98–111)
Creatinine, Ser: 0.67 mg/dL (ref 0.44–1.00)
GFR, Estimated: >60 mL/min (ref >60)
Glucose, Bld: 80 mg/dL (ref 70–99)
Potassium: 2.9 mmol/L — ABNORMAL LOW (ref 3.5–5.1)
Sodium: 135 mmol/L (ref 135–145)

## 2020-11-08 MED ORDER — PANTOPRAZOLE SODIUM 20 MG PO TBEC: 20.0000 mg | DELAYED_RELEASE_TABLET | Freq: Two times a day (BID) | ORAL | 1 refills | Status: DC

## 2020-11-08 MED ORDER — PROMETHAZINE HCL 25 MG PO TABS: 25.0000 mg | ORAL_TABLET | Freq: Four times a day (QID) | ORAL | 0 refills | Status: DC | PRN

## 2020-11-08 MED ORDER — ONDANSETRON 4 MG PO TBDP: 4.0000 mg | ORAL_TABLET | Freq: Four times a day (QID) | ORAL | 0 refills | Status: DC | PRN

## 2020-11-08 MED ORDER — POTASSIUM CHLORIDE CRYS ER 10 MEQ PO TBCR: 10.0000 meq | EXTENDED_RELEASE_TABLET | Freq: Two times a day (BID) | ORAL | 0 refills | Status: DC

## 2020-11-08 NOTE — Plan of Care (Signed)
  Problem: Education: Goal: Knowledge of General Education information will improve Description: Including pain rating scale, medication(s)/side effects and non-pharmacologic comfort measures Outcome: Completed/Met   Problem: Health Behavior/Discharge Planning: Goal: Ability to manage health-related needs will improve Outcome: Completed/Met   Problem: Clinical Measurements: Goal: Ability to maintain clinical measurements within normal limits will improve Outcome: Completed/Met Goal: Will remain free from infection Outcome: Completed/Met Goal: Diagnostic test results will improve Outcome: Completed/Met Goal: Respiratory complications will improve Outcome: Completed/Met Goal: Cardiovascular complication will be avoided Outcome: Completed/Met   Problem: Activity: Goal: Risk for activity intolerance will decrease Outcome: Completed/Met   Problem: Nutrition: Goal: Adequate nutrition will be maintained Outcome: Completed/Met   Problem: Coping: Goal: Level of anxiety will decrease Outcome: Completed/Met   Problem: Elimination: Goal: Will not experience complications related to bowel motility Outcome: Completed/Met Goal: Will not experience complications related to urinary retention Outcome: Completed/Met   Problem: Pain Managment: Goal: General experience of comfort will improve Outcome: Completed/Met   Problem: Safety: Goal: Ability to remain free from injury will improve Outcome: Completed/Met   Problem: Skin Integrity: Goal: Risk for impaired skin integrity will decrease Outcome: Completed/Met   Problem: Education: Goal: Knowledge of disease or condition will improve Outcome: Completed/Met Goal: Knowledge of the prescribed therapeutic regimen will improve Outcome: Completed/Met Goal: Individualized Educational Video(s) Outcome: Completed/Met   Problem: Clinical Measurements: Goal: Complications related to the disease process, condition or treatment will be  avoided or minimized Outcome: Completed/Met   Problem: Education: Goal: Knowledge of disease or condition will improve Outcome: Completed/Met Goal: Knowledge of the prescribed therapeutic regimen will improve Outcome: Completed/Met   Problem: Bowel/Gastric: Goal: Occurences of nausea and/or vomiting will decrease Outcome: Completed/Met   Problem: Fluid Volume: Goal: Maintenance of adequate hydration will improve Outcome: Completed/Met   Problem: Nutritional: Goal: Achievement of adequate weight for body size and type will improve Outcome: Completed/Met

## 2020-11-28 ENCOUNTER — Other Ambulatory Visit: Payer: Self-pay

## 2020-11-28 ENCOUNTER — Inpatient Hospital Stay (HOSPITAL_COMMUNITY)
Admission: AD | Admit: 2020-11-28 | Discharge: 2020-11-30 | DRG: 806 | Disposition: A | Discharge status: Home / Self Care | Payer: Medicaid Other | Attending: Obstetrics and Gynecology | Admitting: Obstetrics and Gynecology

## 2020-11-28 ENCOUNTER — Encounter (HOSPITAL_COMMUNITY): Payer: Self-pay | Admitting: Family Medicine

## 2020-11-28 DIAGNOSIS — Z87891 Personal history of nicotine dependence: Secondary | ICD-10-CM | POA: Diagnosis not present

## 2020-11-28 DIAGNOSIS — O09523 Supervision of elderly multigravida, third trimester: Secondary | ICD-10-CM

## 2020-11-28 DIAGNOSIS — Z8616 Personal history of COVID-19: Secondary | ICD-10-CM | POA: Diagnosis not present

## 2020-11-28 DIAGNOSIS — O99324 Drug use complicating childbirth: Secondary | ICD-10-CM | POA: Diagnosis present

## 2020-11-28 DIAGNOSIS — D573 Sickle-cell trait: Secondary | ICD-10-CM | POA: Diagnosis present

## 2020-11-28 DIAGNOSIS — O26893 Other specified pregnancy related conditions, third trimester: Secondary | ICD-10-CM | POA: Diagnosis present

## 2020-11-28 DIAGNOSIS — Z20822 Contact with and (suspected) exposure to covid-19: Secondary | ICD-10-CM | POA: Diagnosis present

## 2020-11-28 DIAGNOSIS — Z3A37 37 weeks gestation of pregnancy: Secondary | ICD-10-CM | POA: Diagnosis not present

## 2020-11-28 DIAGNOSIS — E876 Hypokalemia: Secondary | ICD-10-CM | POA: Diagnosis present

## 2020-11-28 DIAGNOSIS — Z30017 Encounter for initial prescription of implantable subdermal contraceptive: Secondary | ICD-10-CM | POA: Diagnosis not present

## 2020-11-28 DIAGNOSIS — F1290 Cannabis use, unspecified, uncomplicated: Secondary | ICD-10-CM | POA: Diagnosis present

## 2020-11-28 DIAGNOSIS — O99284 Endocrine, nutritional and metabolic diseases complicating childbirth: Secondary | ICD-10-CM | POA: Diagnosis present

## 2020-11-28 DIAGNOSIS — O0990 Supervision of high risk pregnancy, unspecified, unspecified trimester: Secondary | ICD-10-CM

## 2020-11-28 DIAGNOSIS — O9902 Anemia complicating childbirth: Principal | ICD-10-CM | POA: Diagnosis present

## 2020-11-28 DIAGNOSIS — D582 Other hemoglobinopathies: Secondary | ICD-10-CM | POA: Diagnosis present

## 2020-11-28 DIAGNOSIS — O09529 Supervision of elderly multigravida, unspecified trimester: Secondary | ICD-10-CM

## 2020-11-28 LAB — BASIC METABOLIC PANEL
Anion gap: 7 (ref 5–15)
BUN: 5 mg/dL — ABNORMAL LOW (ref 6–20)
CO2: 23 mmol/L (ref 22–32)
Calcium: 8.6 mg/dL — ABNORMAL LOW (ref 8.9–10.3)
Chloride: 105 mmol/L (ref 98–111)
Creatinine, Ser: 0.5 mg/dL (ref 0.44–1.00)
GFR, Estimated: >60 mL/min (ref >60)
Glucose, Bld: 81 mg/dL (ref 70–99)
Potassium: 3.4 mmol/L — ABNORMAL LOW (ref 3.5–5.1)
Sodium: 135 mmol/L (ref 135–145)

## 2020-11-28 LAB — CBC
HCT: 33.1 % — ABNORMAL LOW (ref 36.0–46.0)
Hemoglobin: 11.5 g/dL — ABNORMAL LOW (ref 12.0–15.0)
MCH: 30.7 pg (ref 26.0–34.0)
MCHC: 34.7 g/dL (ref 30.0–36.0)
MCV: 88.3 fL (ref 80.0–100.0)
Platelets: 280 10*3/uL (ref 150–400)
RBC: 3.75 MIL/uL — ABNORMAL LOW (ref 3.87–5.11)
RDW: 13.2 % (ref 11.5–15.5)
WBC: 7.6 10*3/uL (ref 4.0–10.5)
nRBC: 0 % (ref 0.0–0.2)

## 2020-11-28 LAB — RPR: RPR Ser Ql: NONREACTIVE

## 2020-11-28 LAB — TYPE AND SCREEN
ABO/RH(D): O POS
Antibody Screen: NEGATIVE

## 2020-11-28 LAB — RESP PANEL BY RT-PCR (FLU A&B, COVID) ARPGX2
Influenza A by PCR: NEGATIVE
Influenza B by PCR: NEGATIVE
SARS Coronavirus 2 by RT PCR: NEGATIVE

## 2020-11-28 LAB — MAGNESIUM: Magnesium: 1.7 mg/dL (ref 1.7–2.4)

## 2020-11-28 LAB — GROUP B STREP BY PCR: Group B strep by PCR: NEGATIVE

## 2020-11-28 MED ORDER — OXYCODONE-ACETAMINOPHEN 5-325 MG PO TABS: 2.0000 | ORAL_TABLET | ORAL | Status: DC | PRN

## 2020-11-28 MED ORDER — FERROUS SULFATE 325 (65 FE) MG PO TABS
325.0000 mg | ORAL_TABLET | ORAL | Status: DC
  Administered 2020-11-30: 325 mg via ORAL
  Filled 2020-11-28: qty 1

## 2020-11-28 MED ORDER — ACETAMINOPHEN 325 MG PO TABS
650.0000 mg | ORAL_TABLET | ORAL | Status: DC | PRN
  Administered 2020-11-28 (×2): 650 mg via ORAL
  Filled 2020-11-28 (×5): qty 2

## 2020-11-28 MED ORDER — BENZOCAINE-MENTHOL 20-0.5 % EX AERO
1.0000 "application " | INHALATION_SPRAY | CUTANEOUS | Status: DC | PRN
  Administered 2020-11-28: 1 via TOPICAL
  Filled 2020-11-28: qty 56

## 2020-11-28 MED ORDER — SENNOSIDES-DOCUSATE SODIUM 8.6-50 MG PO TABS
2.0000 | ORAL_TABLET | Freq: Every day | ORAL | Status: DC
  Administered 2020-11-29 – 2020-11-30 (×2): 2 via ORAL
  Filled 2020-11-28 (×2): qty 2

## 2020-11-28 MED ORDER — ACETAMINOPHEN 325 MG PO TABS
650.0000 mg | ORAL_TABLET | ORAL | Status: DC | PRN
  Administered 2020-11-29 – 2020-11-30 (×4): 650 mg via ORAL
  Filled 2020-11-28: qty 2

## 2020-11-28 MED ORDER — TRANEXAMIC ACID-NACL 1000-0.7 MG/100ML-% IV SOLN: 1000.0000 mg | Freq: Once | INTRAVENOUS | Status: DC | PRN

## 2020-11-28 MED ORDER — OXYTOCIN-SODIUM CHLORIDE 30-0.9 UT/500ML-% IV SOLN
2.5000 [IU]/h | INTRAVENOUS | Status: DC
  Administered 2020-11-28: 2.5 [IU]/h via INTRAVENOUS

## 2020-11-28 MED ORDER — IBUPROFEN 600 MG PO TABS: 600.0000 mg | ORAL_TABLET | Freq: Once | ORAL | Status: DC

## 2020-11-28 MED ORDER — ZOLPIDEM TARTRATE 5 MG PO TABS: 5.0000 mg | ORAL_TABLET | Freq: Every evening | ORAL | Status: DC | PRN

## 2020-11-28 MED ORDER — LACTATED RINGERS IV SOLN: 500.0000 mL | INTRAVENOUS | Status: DC | PRN

## 2020-11-28 MED ORDER — SOD CITRATE-CITRIC ACID 500-334 MG/5ML PO SOLN: 30.0000 mL | ORAL | Status: DC | PRN

## 2020-11-28 MED ORDER — OXYCODONE-ACETAMINOPHEN 5-325 MG PO TABS: 1.0000 | ORAL_TABLET | ORAL | Status: DC | PRN

## 2020-11-28 MED ORDER — ONDANSETRON HCL 4 MG/2ML IJ SOLN
4.0000 mg | Freq: Four times a day (QID) | INTRAMUSCULAR | Status: DC | PRN
  Administered 2020-11-28: 4 mg via INTRAVENOUS
  Filled 2020-11-28: qty 2

## 2020-11-28 MED ORDER — SIMETHICONE 80 MG PO CHEW: 80.0000 mg | CHEWABLE_TABLET | ORAL | Status: DC | PRN

## 2020-11-28 MED ORDER — FENTANYL CITRATE (PF) 100 MCG/2ML IJ SOLN
50.0000 ug | INTRAMUSCULAR | Status: DC | PRN
  Administered 2020-11-28: 50 ug via INTRAVENOUS
  Filled 2020-11-28 (×2): qty 2

## 2020-11-28 MED ORDER — PRENATAL MULTIVITAMIN CH
1.0000 | ORAL_TABLET | Freq: Every day | ORAL | Status: DC
  Administered 2020-11-28 – 2020-11-30 (×3): 1 via ORAL
  Filled 2020-11-28 (×3): qty 1

## 2020-11-28 MED ORDER — OXYTOCIN BOLUS FROM INFUSION
333.0000 mL | Freq: Once | INTRAVENOUS | Status: AC
  Administered 2020-11-28: 333 mL via INTRAVENOUS

## 2020-11-28 MED ORDER — WITCH HAZEL-GLYCERIN EX PADS
1.0000 "application " | MEDICATED_PAD | CUTANEOUS | Status: DC | PRN
  Filled 2020-11-28: qty 100

## 2020-11-28 MED ORDER — LIDOCAINE HCL (PF) 1 % IJ SOLN
30.0000 mL | INTRAMUSCULAR | Status: DC | PRN
  Filled 2020-11-28: qty 30

## 2020-11-28 MED ORDER — TETANUS-DIPHTH-ACELL PERTUSSIS 5-2.5-18.5 LF-MCG/0.5 IM SUSY: 0.5000 mL | PREFILLED_SYRINGE | Freq: Once | INTRAMUSCULAR | Status: DC

## 2020-11-28 MED ORDER — MEASLES, MUMPS & RUBELLA VAC IJ SOLR: 0.5000 mL | Freq: Once | INTRAMUSCULAR | Status: DC

## 2020-11-28 MED ORDER — DIBUCAINE (PERIANAL) 1 % EX OINT: 1.0000 "application " | TOPICAL_OINTMENT | CUTANEOUS | Status: DC | PRN

## 2020-11-28 MED ORDER — IBUPROFEN 600 MG PO TABS
600.0000 mg | ORAL_TABLET | Freq: Four times a day (QID) | ORAL | Status: DC
  Administered 2020-11-28 – 2020-11-30 (×9): 600 mg via ORAL
  Filled 2020-11-28 (×9): qty 1

## 2020-11-28 MED ORDER — LACTATED RINGERS IV SOLN: INTRAVENOUS | Status: DC

## 2020-11-28 MED ORDER — OXYTOCIN-SODIUM CHLORIDE 30-0.9 UT/500ML-% IV SOLN
INTRAVENOUS | Status: AC
  Filled 2020-11-28: qty 500

## 2020-11-28 MED ORDER — MEDROXYPROGESTERONE ACETATE 150 MG/ML IM SUSP: 150.0000 mg | INTRAMUSCULAR | Status: DC | PRN

## 2020-11-28 MED ORDER — TRANEXAMIC ACID-NACL 1000-0.7 MG/100ML-% IV SOLN
1000.0000 mg | INTRAVENOUS | Status: AC
  Administered 2020-11-28: 1000 mg via INTRAVENOUS
  Filled 2020-11-28: qty 100

## 2020-11-28 MED ORDER — COCONUT OIL OIL: 1.0000 "application " | TOPICAL_OIL | Status: DC | PRN

## 2020-11-28 NOTE — Plan of Care (Signed)
Veronica Green  Completed

## 2020-11-28 NOTE — MAU Note (Signed)
..  Veronica Green is a 40 y.o. at [redacted]w[redacted]d here in MAU reporting: contractions every 15 minutes since 4am. +FM. Denies vaginal bleeding or leaking of fluid.

## 2020-11-28 NOTE — Discharge Instructions (Signed)

## 2020-11-28 NOTE — Discharge Summary (Signed)
Postpartum Discharge Summary     Patient Name: Veronica Green DOB: 1981-03-16 MRN: [id number]  Date of admission: 11/28/2020 Delivery date:11/28/2020  Delivering provider: Deloris Ping  Date of discharge: 11/30/2020  Admitting diagnosis: Normal labor [O80, Z37.9] Intrauterine pregnancy: [redacted]w[redacted]d    Secondary diagnosis:  Active Problems:   Normal labor   Hypokalemia   Supervision of high risk pregnancy, antepartum   AMA (advanced maternal age) multigravida 35+   Hemoglobin C trait ([place]  Additional problems: none    Discharge diagnosis: Term Pregnancy Delivered                                              Post partum procedures: Nexplanon placement  Augmentation: AROM Complications: None  Hospital course: Onset of Labor With Vaginal Delivery      40y.o. yo [id number] 345w2das admitted in Active Labor on 11/28/2020. Patient had an uncomplicated labor course as follows:  Membrane Rupture Time/Date: 6:31 AM ,11/28/2020   Delivery Method:Vaginal, Spontaneous  Episiotomy: None  Lacerations:  None  Patient had an uncomplicated postpartum course.  She is ambulating, tolerating a regular diet, passing flatus, and urinating well. Patient is discharged home in stable condition on 11/30/20.  Newborn Data: Birth date:11/28/2020  Birth time:8:50 AM  Gender:Female  Living status:Living  Apgars:9 ,9  Weight:3629 g   Magnesium Sulfate received: No BMZ received: No Rhophylac:N/A MMR:N/A T-DaP: offered prior to discharge Flu: N/A Transfusion:No  Physical exam  Vitals:   11/28/20 2318 11/29/20 0511 11/29/20 2308 11/30/20 0523  BP: (!) 130/93 113/73 109/70 111/74  Pulse: 75 73 75 73  Resp: _0 Temp: 98.3 F (36.8 C) 98 F (36.7 C) 98.5 F (36.9 C) 97.7 F (36.5 C)  TempSrc: Oral Oral Oral Axillary  SpO2: 100% 100%    Weight:      Height:       General: alert, cooperative, and no distress Lochia: appropriate Uterine Fundus: firm Incision: N/A DVT  Evaluation: No evidence of DVT seen on physical exam. Labs: Lab Results  Component Value Date   WBC 7.6 11/28/2020   HGB 11.5 (L) 11/28/2020   HCT 33.1 (L) 11/28/2020   MCV 88.3 11/28/2020   PLT 280 11/28/2020   CMP Latest Ref Rng & Units 11/28/2020  Glucose 70 - 99 mg/dL 81  BUN 6 - 20 mg/dL 5(L)  Creatinine 0.44 - 1.00 mg/dL 0.50  Sodium 135 - 145 mmol/L 135  Potassium 3.5 - 5.1 mmol/L 3.4(L)  Chloride 98 - 111 mmol/L 105  CO2 22 - 32 mmol/L 23  Calcium 8.9 - 10.3 mg/dL 8.6(L)  Total Protein 6.5 - 8.1 g/dL -  Total Bilirubin 0.3 - 1.2 mg/dL -  Alkaline Phos 38 - 126 U/L -  AST 15 - 41 U/L -  ALT 0 - 44 U/L -   Edinburgh Score: Edinburgh Postnatal Depression Scale Screening Tool 02/05/2017  I have been able to laugh and see the funny side of things. 1  I have looked forward with enjoyment to things. 0  I have blamed myself unnecessarily when things went wrong. 1  I have been anxious or worried for no good reason. 2  I have felt scared or panicky for no good reason. 0  Things have been getting on top of me. 2  I have been so unhappy that  I have had difficulty sleeping. 0  I have felt sad or miserable. 1  I have been so unhappy that I have been crying. 0  The thought of harming myself has occurred to me. 0  Edinburgh Postnatal Depression Scale Total 7     After visit meds:  Allergies as of 11/30/2020   No Known Allergies      Medication List     TAKE these medications    acetaminophen 325 MG tablet Commonly known as: Tylenol Take 2 tablets (650 mg total) by mouth every 4 (four) hours as needed (for pain scale < 4).   benzocaine-Menthol 20-0.5 % Aero Commonly known as: DERMOPLAST Apply 1 application topically as needed for irritation (perineal discomfort).   Blood Pressure Monitor Kit 1 Device by Does not apply route once a week. To be monitored Regularly at home.   docusate sodium 100 MG capsule Commonly known as: COLACE Take 1 capsule (100 mg total) by  mouth daily.   hydrOXYzine 25 MG tablet Commonly known as: ATARAX/VISTARIL Take 1 tablet (25 mg total) by mouth every 6 (six) hours.   ibuprofen 600 MG tablet Commonly known as: ADVIL Take 1 tablet (600 mg total) by mouth every 6 (six) hours.   metoCLOPramide 10 MG tablet Commonly known as: REGLAN Take 1 tablet (10 mg total) by mouth 3 (three) times daily with meals.   ondansetron 8 MG disintegrating tablet Commonly known as: Zofran ODT Take 1 tablet (8 mg total) by mouth every 8 (eight) hours as needed for nausea or vomiting.   pantoprazole 20 MG tablet Commonly known as: PROTONIX Take 1 tablet (20 mg total) by mouth 2 (two) times daily.   potassium chloride 10 MEQ tablet Commonly known as: KLOR-CON Take 1 tablet (10 mEq total) by mouth 2 (two) times daily.   PrePLUS 27-1 MG Tabs Take 1 tablet by mouth daily.   promethazine 25 MG tablet Commonly known as: PHENERGAN Take 1 tablet (25 mg total) by mouth every 6 (six) hours as needed for nausea or vomiting.   scopolamine 1 MG/3DAYS Commonly known as: TRANSDERM-SCOP Place 1 patch (1.5 mg total) onto the skin every 3 (three) days.   witch hazel-glycerin pad Commonly known as: TUCKS Apply 1 application topically as needed for hemorrhoids.        Discharge home in stable condition Infant Feeding: Bottle and Breast Infant Disposition:home with mother Discharge instruction: per After Visit Summary and Postpartum booklet. Activity: Advance as tolerated. Pelvic rest for 6 weeks.  Diet: routine diet Future Appointments: Future Appointments  Date Time Provider [place]  12/16/2020  1:00 PM Lynnea Ferrier, LCSW [place] None  12/26/2020  1:00 PM Woodroe Mode, MD [place] None   Follow up Visit: Message sent my Maryelizabeth Kaufmann, [place] on 11/28/2020   Please schedule this patient for a Virtual postpartum visit in 4 weeks with the following provider: Any provider. Additional Postpartum F/U: mood check   High risk  pregnancy complicated by: Hyperemesis, THC use, Hypokalemia Delivery mode:  Vaginal, Spontaneous  Anticipated Birth Control:  Nexplanon, placed inpatient    Renee Harder, MSN, CNM 11/30/20 12:05 PM

## 2020-11-28 NOTE — H&P (Signed)
OBSTETRIC ADMISSION HISTORY AND PHYSICAL  Veronica Green is a 40 y.o. female [phone] with IUP at 60w2dby LMP presenting for SOL. She reports +FMs, No LOF, no VB, no blurry vision, headaches or peripheral edema, and RUQ pain.  She plans on bottle feeding. She request nexplnaon for birth control. [facility] --->  Estimated Date of Delivery: 12/17/20  Sono:    10/25/20'@[redacted]w[redacted]d' , CWD, normal anatomy, cephalic presentation, posterior placental lie, 2284g, 82% EFW   Prenatal History/Complications:  Grand multiparity Hypokalemia Hgb C trait THC use in pregnancy  Past Medical History: Past Medical History:  Diagnosis Date   Anxiety    Ascites    Cannabis hyperemesis syndrome concurrent with and due to cannabis abuse ([place]    Sickle cell trait ([place]    Umbilical hernia     Past Surgical History: Past Surgical History:  Procedure Laterality Date   DILATION AND CURETTAGE OF UTERUS     DILATION AND EVACUATION N/A 04/17/2019   Procedure: SUCTION DILATATION AND EVACUATION;  Surgeon: PAletha Halim MD;  [facility]  Service: Gynecology;  Laterality: N/A;   OPERATIVE ULTRASOUND N/A 04/17/2019   Procedure: OPERATIVE ULTRASOUND;  Surgeon: PAletha Halim MD;  [facility]  Service: Gynecology;  Laterality: N/A;   RIGHT OOPHORECTOMY  07/2016   diagnostic laparoscopy converted to ex lap w/ umbilical hernia repair @ [facility] (benign)   UMBILICAL HERNIA REPAIR  07/2016   [facility]    Obstetrical History: OB History     Gravida  8   Para  5   Term  5   Preterm  0   AB  2   Living  5      SAB  0   IAB  1   Ectopic  0   Multiple  0   Live Births  5           Social History Social History   Socioeconomic History   Marital status: Single    Spouse name: Not on file   Number of children: Not on file   Years of education: Not on file   Highest education level: Not on file  Occupational History   Not on file  Tobacco  Use   Smoking status: Former    Packs/day: 0.50    Pack years: 0.00    Types: Cigarettes    Quit date: 11/21/2015    Years since quitting: 5.0   Smokeless tobacco: Never   Tobacco comments:    unable to smoke last 2 wks  Vaping Use   Vaping Use: Never used  Substance and Sexual Activity   Alcohol use: No    Comment: occ   Drug use: Not Currently    Types: Marijuana    Comment: stopped June 2020   Sexual activity: Not Currently    Partners: Male    Birth control/protection: None  Other Topics Concern   Not on file  Social History Narrative   Not on file   Social Determinants of Health   Financial Resource Strain: Not on file  Food Insecurity: Not on file  Transportation Needs: Not on file  Physical Activity: Not on file  Stress: Not on file  Social Connections: Not on file    Family History: Family History  Problem Relation Age of Onset   Cancer Maternal Grandmother    Cancer Paternal Grandmother    Asthma Mother    Multiple sclerosis Sister  Healthy Father    Anesthesia problems Neg Hx    Hypotension Neg Hx    Malignant hyperthermia Neg Hx    Pseudochol deficiency Neg Hx     Allergies: No Known Allergies  Medications Prior to Admission  Medication Sig Dispense Refill Last Dose   Prenatal Vit-Fe Fumarate-FA (PREPLUS) 27-1 MG TABS Take 1 tablet by mouth daily. 30 tablet 13 11/27/2020   Blood Pressure Monitor KIT 1 Device by Does not apply route once a week. To be monitored Regularly at home. 1 kit 0    docusate sodium (COLACE) 100 MG capsule Take 1 capsule (100 mg total) by mouth daily. 10 capsule 0    hydrOXYzine (ATARAX/VISTARIL) 25 MG tablet Take 1 tablet (25 mg total) by mouth every 6 (six) hours. 12 tablet 2    metoCLOPramide (REGLAN) 10 MG tablet Take 1 tablet (10 mg total) by mouth 3 (three) times daily with meals. 30 tablet 0    ondansetron (ZOFRAN ODT) 8 MG disintegrating tablet Take 1 tablet (8 mg total) by mouth every 8 (eight) hours as needed for  nausea or vomiting. 20 tablet 0    pantoprazole (PROTONIX) 20 MG tablet Take 1 tablet (20 mg total) by mouth 2 (two) times daily. 30 tablet 1    potassium chloride (KLOR-CON) 10 MEQ tablet Take 1 tablet (10 mEq total) by mouth 2 (two) times daily. 20 tablet 0    promethazine (PHENERGAN) 25 MG tablet Take 1 tablet (25 mg total) by mouth every 6 (six) hours as needed for nausea or vomiting. 30 tablet 0    scopolamine (TRANSDERM-SCOP) 1 MG/3DAYS Place 1 patch (1.5 mg total) onto the skin every 3 (three) days. 10 patch 12      Review of Systems   All systems reviewed and negative except as stated in HPI  Blood pressure 120/78, pulse 85, temperature 97.9 F (36.6 C), temperature source Oral, resp. rate 16, height 5' (1.524 m), weight 62.3 kg, last menstrual period 03/12/2020, SpO2 99 %, unknown if currently breastfeeding. General appearance: alert, cooperative, and no distress Lungs: normal respiratory effort Heart: regular rate and rhythm Abdomen: soft, non-tender Pelvic: as noted below Extremities: Homans sign is negative, no sign of DVT Presentation: cephalic by MAU RN cervical exam Fetal monitoringBaseline: 140 bpm, Variability: Good {> 6 bpm), Accelerations: Reactive, and Decelerations: Absent Uterine activityFrequency: Every 5 minutes     Prenatal labs: ABO, Rh: --/--/O POS (05/27 1813) Antibody: NEG (05/27 1813) Rubella: 3.90 (01/07 1004) RPR: Non Reactive (05/11 1022)  HBsAg: Negative (01/07 1004)  HIV: Non Reactive (05/11 1022)  GBS:    2 hr Glucola passed Genetic screening normal Anatomy US normal  Prenatal Transfer Tool  Maternal Diabetes: No Genetic Screening: Normal Maternal Ultrasounds/Referrals: Normal Fetal Ultrasounds or other Referrals:  None Maternal Substance Abuse:  Yes:  Type: Marijuana Significant Maternal Medications:  None Significant Maternal Lab Results: Other: GBS unk  No results found for this or any previous visit (from the past 24  hour(s)).  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 [id number] at 61w2dhere for SOL.  #Labor: AROM with this exam, continue  expectant management. #Pain: PRN #FWB: Cat 1 #ID: GBS unk, no risk factors, term, will not treat #MOF: bottle #MOC:nexplanon #Circ: n/a #Grand multiparity: TXA at delivery #THC use in pregnancy: SW postpartum #Hypokalemia: BMP and Mg on admit, supplement as needed  Arrie Senate, MD  11/28/2020, 6:02 AM

## 2020-11-29 DIAGNOSIS — Z30017 Encounter for initial prescription of implantable subdermal contraceptive: Secondary | ICD-10-CM

## 2020-11-29 MED ORDER — LIDOCAINE HCL 1 % IJ SOLN
0.0000 mL | Freq: Once | INTRAMUSCULAR | Status: AC | PRN
  Administered 2020-11-29: 3 mL via INTRADERMAL
  Filled 2020-11-29: qty 20

## 2020-11-29 MED ORDER — ETONOGESTREL 68 MG ~~LOC~~ IMPL
68.0000 mg | DRUG_IMPLANT | Freq: Once | SUBCUTANEOUS | Status: AC
  Administered 2020-11-29: 68 mg via SUBCUTANEOUS
  Filled 2020-11-29: qty 1

## 2020-11-29 NOTE — Procedures (Signed)
Post-Placental Nexplanon Insertion Procedure Note  Patient was identified. Informed consent was signed, signed copy in chart. A time-out was performed.    The insertion site was identified 8-10 cm (3-4 inches) from the medial epicondyle of the humerus and 3-5 cm (1.25-2 inches) posterior to (below) the sulcus (groove) between the biceps and triceps muscles of the patient's left arm and marked. The site was prepped and draped in the usual sterile fashion. Pt was prepped with alcohol swab and then injected with 3 cc of 1% lidocaine.  The site was prepped with betadine. Nexplanon removed form packaging,  Device confirmed in needle, then inserted full length of needle and withdrawn per handbook instructions. Provider and patient verified presence of the implant in the woman's arm by palpation. Pt insertion site was covered with steristrips/adhesive bandage and pressure bandage. There was minimal blood loss. Patient tolerated procedure well.  Patient was given post procedure instructions and Nexplanon user card with expiration date. Condoms were recommended for STI prevention. Patient was asked to keep the pressure dressing on for 24 hours to minimize bruising and keep the adhesive bandage on for 3-5 days. The patient verbalized understanding of the plan of care and agrees.   Lot # [id number] Expiration Date 2024 Jul 03  Randa Ngo, MD [facility] Fellow, Faculty Practice 11/29/2020 1:54 PM

## 2020-11-29 NOTE — Progress Notes (Signed)
POSTPARTUM PROGRESS NOTE  Subjective: Veronica Green is a 40 y.o. [id number] s/p vaginal delivery at [redacted]w[redacted]d.  She reports she doing well. No acute events overnight. She denies any problems with ambulating, voiding or po intake. Denies nausea or vomiting. She has passed flatus. Pain is moderately controlled.  Lochia is minimal. No concerns at this time.  Objective: Blood pressure 113/73, pulse 73, temperature 98 F (36.7 C), temperature source Oral, resp. rate 17, height 5\' 4"  (1.626 m), weight 62.3 kg, last menstrual period 03/12/2020, SpO2 100 %, unknown if currently breastfeeding.  Physical Exam:  General: alert, cooperative and no distress Chest: no respiratory distress Abdomen: soft, non-tender  Uterine Fundus: firm and at level of umbilicus Extremities: No calf swelling or tenderness  no LE edema  Recent Labs    11/28/20 0601  HGB 11.5*  HCT 33.1*    Assessment/Plan: Veronica Green is a 40 y.o. [id number] s/p vaginal delivery at [redacted]w[redacted]d.   Routine Postpartum Care: Doing well, pain well-controlled.  -- Continue routine care, lactation support  -- Contraception: plan for inpatient Nexplanon prior to discharge. Pt consented for procedure this AM. -- Feeding: breast  Dispo: Plan for discharge PPD#2 per pt preference.  Randa Ngo, MD OB Fellow, Faculty Practice 11/29/2020 9:51 AM

## 2020-11-29 NOTE — Clinical Social Work Maternal (Signed)
CLINICAL SOCIAL WORK MATERNAL/CHILD NOTE  Patient Details  Name: Veronica Green MRN: [id number] Date of Birth: Jul 01, 1980  Date:  11/29/2020  Clinical Social Worker Initiating Note:  Darra Lis, [place] Date/Time: Initiated:  11/29/20/1030     Child's Name:  Jerrye Noble   Biological Parents:  Mother, Father Jodi Marble)   Need for Interpreter:  None   Reason for Referral:  Current Substance Use/Substance Use During Pregnancy     Address:  [place] [address]    Phone number:  [phone] (home)     Additional phone number:  Household Members/Support Persons (HM/SP):   Household Member/Support Person 1, Household Member/Support Person 2, Household Member/Support Person 3, Household Member/Support Person 4, Household Member/Support Person 6, Household Member/Support Person 5   HM/SP Name Relationship DOB or Age  HM/SP -Horine Significant Other 01/14/1974  HM/SP -2 Rahmir Owens Shark Son 11/11/1999  HM/SP -3 Tarri Fuller Daughter 07/13/2009  HM/SP -4 Higinio Roger Daughter 05/06/2011  HM/SP -5 Britt Boozer Daughter 09/14/2014  HM/SP -6 Norman Clay Son 01/09/2017  HM/SP -7        HM/SP -8          Natural Supports (not living in the home):  Immediate Family   Professional Supports: None   Employment: Unemployed   Type of Work:     Education:  Programmer, systems   Homebound arranged:    Museum/gallery curator Resources:  Medicaid   Other Resources:  Physicist, medical  , [place] Considerations Which May Impact Care:    Strengths:  Ability to meet basic needs  , Engineer, materials, Home prepared for child     Psychotropic Medications:         Pediatrician:    Solicitor area  Pediatrician List:   [facility] ([address])  [place]      Pediatrician Fax Number:    Risk  Factors/Current Problems:  Substance Use     Cognitive State:  Alert  , Insightful  , Linear Thinking     Mood/Affect:  Calm  , Interested  , Happy     CSW Assessment: CSW received consult for THC and anxiety. CSW met with MOB to complete assessment and offer support. CSW introduced self and role. CSW observed MOB holding sleeping infant. CSW informed MOB of reason for consult and assessed current emotions. MOB reported she is currently doing pretty good. MOB stated she also had a good pregnancy. MOB resides with FOB and their five other children. MOB is currently receiving WIC and food stamp resources.   CSW inquired on MOB mental health history. MOB reported she experienced anxiety in 2018 while pregnant with her son. MOB stated she had a tumor at the time and was overwhelmed with everything that was happening. MOB shared the anxiety was situational and denies experiencing any symptoms during pregnancy.MOB stated she has never taken medication or attended therapy. MOB reported her support system is strong, consisting of FOB, her sisters and mother. MOB denies any current SI, HI or being involved in DV.  CSW asked MOB about substance use. MOB disclosed she smoked THC during pregnancy, with the last use being 3 to 4 months ago. MOB shared it helped with eating and she only did it on the weekends. MOB denies any additional substance use. CSW informed MOB that infant's  UDS is THC positive. MOB aware infant's CDS will be followed. CSW made MOB aware that a CPS report will be made due to infant's UDS. MOB was understanding and denies any previous CPS history.  CSW provided education regarding the baby blues period versus PPD and provided resources. CSW provided the New Mom Checklist and encouraged MOB to self evaluate and contact a medical professional if symptoms are noted at any time. MOB was understanding and stated she feels comfortable seeking treatment if needed.  CSW provided review of Sudden Infant  Death Syndrome (SIDS) precautions. MOB stated she has all essentials for infant, including a crib. MOB denies any barriers to follow-up care and expressed no needs at this time.   CSW filed CPS report with [facility] for positive UDS. CSW will continue to follow CDS and make an additional report if warranted. CSW identifies no further need for intervention and no barriers to discharge at this time.  CSW Plan/Description:  CSW Will Continue to Monitor Umbilical Cord Tissue Drug Screen Results and Make Report if Warranted, Child Protective Service Report  , Hospital Drug Screen Policy Information, Perinatal Mood and Anxiety Disorder (PMADs) Education, Sudden Infant Death Syndrome (SIDS) Education, Other Information/Referral to [employer], Other Patient/Family Education, No Further Intervention Required/No Barriers to Discharge    Waylan Boga, [place] 11/29/2020, 11:08 AM

## 2020-11-30 MED ORDER — BENZOCAINE-MENTHOL 20-0.5 % EX AERO: 1.0000 "application " | INHALATION_SPRAY | CUTANEOUS | 0 refills | Status: DC | PRN

## 2020-11-30 MED ORDER — WITCH HAZEL-GLYCERIN EX PADS: 1.0000 "application " | MEDICATED_PAD | CUTANEOUS | 12 refills | Status: DC | PRN

## 2020-11-30 MED ORDER — ACETAMINOPHEN 325 MG PO TABS: 650.0000 mg | ORAL_TABLET | ORAL | 0 refills | Status: DC | PRN

## 2020-11-30 MED ORDER — IBUPROFEN 600 MG PO TABS: 600.0000 mg | ORAL_TABLET | Freq: Four times a day (QID) | ORAL | 0 refills | Status: DC

## 2020-11-30 NOTE — Lactation Note (Signed)
This note was copied from a baby's chart. Lactation Consultation Note  Patient Name: Veronica Green [id number] Date: 11/30/2020 Reason for consult: Initial assessment;Early term 37-38.6wks (-2% weight loss) Age:39 hours, ETI female infant with 7 voids and 3 stools since birth. Per mom, most feedings are 30 to 40 minutes in length. Mom wants a hand pump due to not having a breast pump at home. LC was demonstrating to mom how to use hand pump and within few minutes she easily expressed 15 mls  EBM from her right breast.  Mom plans to offer to infant her EBM after she finish BF infant , using a slow flow bottle nipple ( purple and gold).  Mom latched infant on her left breast using the cross cradle hold, infant latched with depth and swallows heard, infant was still BF after 10 minutes when LC left the room. Per mom, she was offering infant formula but infant did not drink it, she wants to breastfeed infant now , due to formula shortage and afterwards give infant back any EBM that she pumps. Mom will continue to latch infant at the breast and BF infant according to feeding cues, 8 to 12 or more times within 24 hours, STS. Mom  was shown how to use hand pump  & how to disassemble, clean, & reassemble parts.  LC discussed infant's input and output with mom. Mom made aware of O/P services, breastfeeding support groups, community resources, and our phone # for post-discharge questions.     Maternal Data Has patient been taught Hand Expression?: Yes Does the patient have breastfeeding experience prior to this delivery?: Yes How long did the patient breastfeed?: Per mom, she only BF her other 5 children in hospital but plans to BF 6 th child longer since infant latches well and their is formula shortage.  Feeding Mother's Current Feeding Choice: Breast Milk and Formula  LATCH Score Latch: Grasps breast easily, tongue down, lips flanged, rhythmical sucking.  Audible Swallowing: Spontaneous and  intermittent  Type of Nipple: Everted at rest and after stimulation  Comfort (Breast/Nipple): Soft / non-tender  Hold (Positioning): Assistance needed to correctly position infant at breast and maintain latch.  LATCH Score: 9   Lactation Tools Discussed/Used Tools: Pump Breast pump type: Manual Pump Education: Setup, frequency, and cleaning;Milk Storage Reason for Pumping: Per mom, she doesn't have breast pump at home and she wants one Pumping frequency: LC was showing mom how to use hand pump and she easily expressed 15 mls of colostrum on her right breast, she will offer to infant after she finish breastfeeding infant. Pumped volume: 15 mL  Interventions Interventions: Breast feeding basics reviewed;Assisted with latch;Skin to skin;Hand express;Breast compression;Adjust position;Support pillows;Position options;Expressed milk;Hand pump;Education  Discharge Pump: Manual WIC Program: Yes  Consult Status Consult Status: Follow-up Date: 11/30/20 Follow-up type: In-patient    Vicente Serene 11/30/2020, 2:12 AM

## 2020-11-30 NOTE — Lactation Note (Signed)
This note was copied from a baby's chart. Lactation Consultation Note  Patient Name: Veronica Green [id number] Date: 11/30/2020 Reason for consult: Follow-up assessment;Early term 37-38.6wks;Infant weight loss;Other (Comment) (4 % weight loss , @ 43 hours Bili check - 9.5 Low - intermediate. per mom last feeding was 30 ml of pumped milk. breast and bottle , baby seems to like the breast feeding and breast milk over the formula. Milk is in.) Age:39 hours Per mom plans to breast feed and provide expressed milk in a bottle.  See below for D/C instructions.  LC plan:  Feed with feeding cues-  8-12 times a day.  If baby is receiving a bottle of EBM for a feeding/ important to pump to prevent engorgement.  Mom has a hand pump for D/C with #24 F and #27 F .  Mom aware of the [facility] resources after D/C.     Maternal Data Has patient been taught Hand Expression?: Yes  Feeding Mother's Current Feeding Choice: Breast Milk Nipple Type: Extra Slow Flow  LATCH Score   Lactation Tools Discussed/Used Tools: Pump;Flanges Flange Size: 24;27 Breast pump type: Manual Pump Education: Milk Storage  Interventions Interventions: Breast feeding basics reviewed;Hand pump;Education  Discharge Discharge Education: Engorgement and breast care;Warning signs for feeding baby Pump: Manual;Personal WIC Program: Yes  Consult Status Consult Status: Complete Date: 11/30/20    Myer Haff 11/30/2020, 8:52 AM

## 2020-12-11 ENCOUNTER — Telehealth (HOSPITAL_COMMUNITY): Payer: Self-pay | Admitting: *Deleted

## 2020-12-11 NOTE — Telephone Encounter (Signed)
Unable to leave message. No answer.  Odis Hollingshead, RN 12/11/2020 at 10:05am.

## 2020-12-16 ENCOUNTER — Encounter: Payer: Medicaid Other | Admitting: Licensed Clinical Social Worker

## 2020-12-26 ENCOUNTER — Ambulatory Visit: Payer: Medicaid Other | Admitting: Obstetrics & Gynecology

## 2022-04-05 ENCOUNTER — Encounter: Payer: Self-pay | Admitting: Emergency Medicine

## 2022-04-05 ENCOUNTER — Ambulatory Visit
Admission: EM | Admit: 2022-04-05 | Discharge: 2022-04-05 | Disposition: A | Discharge status: Home / Self Care | Payer: Medicaid Other | Attending: Emergency Medicine | Admitting: Emergency Medicine

## 2022-04-05 ENCOUNTER — Other Ambulatory Visit: Payer: Self-pay

## 2022-04-05 DIAGNOSIS — J020 Streptococcal pharyngitis: Secondary | ICD-10-CM

## 2022-04-05 LAB — POCT RAPID STREP A (OFFICE): Rapid Strep A Screen: POSITIVE — AB

## 2022-04-05 MED ORDER — AMOXICILLIN 500 MG PO CAPS: 1000.0000 mg | ORAL_CAPSULE | Freq: Every day | ORAL | 0 refills | Status: AC

## 2022-04-05 MED ORDER — LIDOCAINE VISCOUS HCL 2 % MT SOLN: 15.0000 mL | OROMUCOSAL | 0 refills | Status: DC | PRN

## 2022-04-05 MED ORDER — IBUPROFEN 400 MG PO TABS: 400.0000 mg | ORAL_TABLET | Freq: Three times a day (TID) | ORAL | 0 refills | Status: DC | PRN

## 2022-04-05 NOTE — ED Triage Notes (Signed)
Pt here for sore throat with body aches x 4 days

## 2022-04-05 NOTE — Discharge Instructions (Signed)
Your strep test today is positive.  I recommend that you begin antibiotics now for treatment.  I have sent a prescription to your pharmacy.  Please take them as prescribed.  You will begin to feel better in about 24 hours.  Please be sure that you you finish the entire 10-day course of treatment to avoid worsening infection that may require longer treatment with stronger antibiotics.   After 24 hours of taking antibiotics, you are no longer contagious.  Please discard your toothbrush as well as any other oral devices that you are currently using and replace them with new ones to avoid reinfection.   Advil, Motrin (ibuprofen): This is a good anti-inflammatory medication which addresses aches, pains and inflammation of the upper airways that causes sinus and nasal congestion as well as in the lower airways which makes your cough feel tight and sometimes burn.  I recommend that you take 400 mg every 6-8 hours as needed.      Xylocaine (lidocaine): This is a numbing medication that can be swished for 15 seconds and swallowed.  You can use this every 3 hours while awake to relieve pain in your mouth and throat.  I have sent a prescription for this medication to your pharmacy.   Please follow-up within the next 7-10 days either with your primary care provider or urgent care if your symptoms do not resolve.  If you do not have a primary care provider, we will assist you in finding one.        Thank you for visiting urgent care today.  We appreciate the opportunity to participate in your care.

## 2022-04-05 NOTE — ED Provider Notes (Signed)
[facility]    CSN: [id number] Arrival date & time: 04/05/22  6789    HISTORY   Chief Complaint  Patient presents with   Sore Throat   HPI Veronica Green is a pleasant, 41 y.o. female who presents to urgent care today. Pt here for sore throat with body aches x 4 days.  The history is provided by the patient.  Sore Throat This is a new problem. The current episode started more than 2 days ago. The problem occurs constantly. The problem has been gradually worsening. Associated symptoms include headaches. Pertinent negatives include no chest pain, no abdominal pain and no shortness of breath. The symptoms are aggravated by swallowing, eating and drinking. Nothing relieves the symptoms. She has tried nothing for the symptoms.   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   Unwanted fertility 08/11/2016   Normal labor 09/14/2014   Past Surgical History:  Procedure Laterality Date   DILATION AND CURETTAGE OF UTERUS     DILATION AND EVACUATION N/A 04/17/2019   Procedure: SUCTION DILATATION AND EVACUATION;  Surgeon: Aletha Halim, MD;  Location: [place];  Service: Gynecology;  Laterality: N/A;   OPERATIVE ULTRASOUND N/A 04/17/2019   Procedure: OPERATIVE ULTRASOUND;  Surgeon: Aletha Halim, MD;  Location: [place];  Service: Gynecology;  Laterality: N/A;   RIGHT OOPHORECTOMY  07/2016   diagnostic laparoscopy converted to ex lap w/ umbilical hernia repair @ [facility] (benign)   UMBILICAL HERNIA REPAIR  07/2016   [facility]   OB History     Gravida  8    Para  6   Term  6   Preterm  0   AB  2   Living  6      SAB  0   IAB  1   Ectopic  0   Multiple  0   Live Births  6          Home Medications    Prior to Admission medications   Medication Sig Start Date End Date Taking? Authorizing Provider  amoxicillin (AMOXIL) 500 MG capsule Take 2 capsules (1,000 mg total) by mouth daily for 10 days. 04/05/22 04/15/22 Yes Lynden Oxford Scales, PA-C  ibuprofen (ADVIL) 400 MG tablet Take 1 tablet (400 mg total) by mouth every 8 (eight) hours as needed for up to 30 doses. 04/05/22  Yes Lynden Oxford Scales, PA-C  lidocaine (XYLOCAINE) 2 % solution Use as directed 15 mLs in the mouth or throat every 3 (three) hours as needed for mouth pain (Sore throat). 04/05/22  Yes Lynden Oxford Scales, PA-C  acetaminophen (TYLENOL) 325 MG tablet Take 2 tablets (650 mg total) by mouth every 4 (four) hours as needed (for pain scale < 4). 11/30/20   Maryagnes Amos L, CNM  benzocaine-Menthol (DERMOPLAST) 20-0.5 % AERO Apply 1 application topically as needed for irritation (perineal discomfort). 11/30/20   Renee Harder, CNM  Blood Pressure Monitor KIT 1 Device by Does not apply route once a week. To be monitored Regularly at home. 06/06/20  Woodroe Mode, MD  docusate sodium (COLACE) 100 MG capsule Take 1 capsule (100 mg total) by mouth daily. 11/02/20   Donnamae Jude, MD  hydrOXYzine (ATARAX/VISTARIL) 25 MG tablet Take 1 tablet (25 mg total) by mouth every 6 (six) hours. 11/01/20   Rasch, Anderson Malta I, NP  metoCLOPramide (REGLAN) 10 MG tablet Take 1 tablet (10 mg total) by mouth 3 (three) times daily with meals. 11/01/20 11/01/21  Nugent, Gerrie Nordmann, NP  pantoprazole (PROTONIX) 20 MG tablet Take 1 tablet (20 mg total) by mouth 2 (two) times daily. 11/08/20   Woodroe Mode, MD  potassium chloride (KLOR-CON) 10 MEQ tablet Take 1 tablet (10 mEq total) by mouth 2 (two) times daily. 11/08/20   Woodroe Mode, MD  Prenatal Vit-Fe Fumarate-FA (PREPLUS) 27-1  MG TABS Take 1 tablet by mouth daily. 06/14/20   Constant, Vickii Chafe, MD  witch hazel-glycerin (TUCKS) pad Apply 1 application topically as needed for hemorrhoids. 11/30/20   Renee Harder, CNM    Family History Family History  Problem Relation Age of Onset   Cancer Maternal Grandmother    Cancer Paternal Grandmother    Asthma Mother    Multiple sclerosis Sister    Healthy Father    Anesthesia problems Neg Hx    Hypotension Neg Hx    Malignant hyperthermia Neg Hx    Pseudochol deficiency Neg Hx    Social History Social History   Tobacco Use   Smoking status: Former    Packs/day: 0.50    Types: Cigarettes    Quit date: 11/21/2015    Years since quitting: 6.3   Smokeless tobacco: Never   Tobacco comments:    unable to smoke last 2 wks  Vaping Use   Vaping Use: Never used  Substance Use Topics   Alcohol use: No    Comment: occ   Drug use: Yes    Types: Marijuana   Allergies   Patient has no known allergies.  Review of Systems Review of Systems  Respiratory:  Negative for shortness of breath.   Cardiovascular:  Negative for chest pain.  Gastrointestinal:  Negative for abdominal pain.  Neurological:  Positive for headaches.   Pertinent findings revealed after performing a 14 point review of systems has been noted in the history of present illness.  Physical Exam Triage Vital Signs ED Triage Vitals  Enc Vitals Group     BP 04/04/21 0827 (!) 147/82     Pulse Rate 04/04/21 0827 72     Resp 04/04/21 0827 18     Temp 04/04/21 0827 98.3 F (36.8 C)     Temp Source 04/04/21 0827 Oral     SpO2 04/04/21 0827 98 %     Weight --      Height --      Head Circumference --      Peak Flow --      Pain Score 04/04/21 0826 5     Pain Loc --      Pain Edu? --      Excl. in [place]? --   No data found.  Updated Vital Signs BP 102/69 (BP Location: Left Arm)   Pulse 73   Temp 98.7 F (37.1 C) (Oral)   Resp 18   SpO2 96%   Physical Exam Constitutional:      General: She  is not in acute distress.    Appearance: She is well-developed. She is ill-appearing. She is not toxic-appearing.  HENT:  Head: Normocephalic and atraumatic.     Salivary Glands: Right salivary gland is diffusely enlarged and tender. Left salivary gland is diffusely enlarged and tender.     Right Ear: Hearing and external ear normal.     Left Ear: Hearing and external ear normal.     Ears:     Comments: Bilateral EACs with mild erythema, bilateral TMs are normal    Nose: No mucosal edema, congestion or rhinorrhea.     Right Turbinates: Not enlarged, swollen or pale.     Left Turbinates: Not enlarged or swollen.     Right Sinus: No maxillary sinus tenderness or frontal sinus tenderness.     Left Sinus: No maxillary sinus tenderness or frontal sinus tenderness.     Mouth/Throat:     Lips: Pink. No lesions.     Mouth: Mucous membranes are moist. No oral lesions or angioedema.     Dentition: No gingival swelling.     Tongue: No lesions.     Palate: No mass.     Pharynx: Uvula midline. Pharyngeal swelling, oropharyngeal exudate and posterior oropharyngeal erythema present. No uvula swelling.     Tonsils: Tonsillar exudate present. 2+ on the right. 2+ on the left.  Eyes:     Extraocular Movements: Extraocular movements intact.     Conjunctiva/sclera: Conjunctivae normal.     Pupils: Pupils are equal, round, and reactive to light.  Neck:     Thyroid: No thyroid mass, thyromegaly or thyroid tenderness.     Trachea: Tracheal tenderness present. No abnormal tracheal secretions or tracheal deviation.     Comments: Voice is muffled Cardiovascular:     Rate and Rhythm: Normal rate and regular rhythm.     Pulses: Normal pulses.     Heart sounds: Normal heart sounds, S1 normal and S2 normal. No murmur heard.    No friction rub. No gallop.  Pulmonary:     Effort: Pulmonary effort is normal. No accessory muscle usage, prolonged expiration, respiratory distress or retractions.     Breath  sounds: No stridor, decreased air movement or transmitted upper airway sounds. No decreased breath sounds, wheezing, rhonchi or rales.  Abdominal:     General: Bowel sounds are normal.     Palpations: Abdomen is soft.     Tenderness: There is generalized abdominal tenderness. There is no right CVA tenderness, left CVA tenderness or rebound. Negative signs include Murphy's sign.     Hernia: No hernia is present.  Musculoskeletal:        General: No tenderness. Normal range of motion.     Cervical back: Full passive range of motion without pain, normal range of motion and neck supple.     Right lower leg: No edema.     Left lower leg: No edema.  Lymphadenopathy:     Cervical: Cervical adenopathy present.     Right cervical: Superficial cervical adenopathy present.     Left cervical: Superficial cervical adenopathy present.  Skin:    General: Skin is warm and dry.     Findings: No erythema, lesion or rash.  Neurological:     General: No focal deficit present.     Mental Status: She is alert and oriented to person, place, and time. Mental status is at baseline.  Psychiatric:        Mood and Affect: Mood normal.        Behavior: Behavior normal.        Thought Content: Thought content normal.  Judgment: Judgment normal.     Visual Acuity Right Eye Distance:   Left Eye Distance:   Bilateral Distance:    Right Eye Near:   Left Eye Near:    Bilateral Near:     [facility] Couse / Diagnostics / Procedures:     Radiology No results found.  Procedures Procedures (including critical care time) EKG  Pending results:  Labs Reviewed  POCT RAPID STREP A (OFFICE) - Abnormal; Notable for the following components:      Result Value   Rapid Strep A Screen Positive (*)    All other components within normal limits    Medications Ordered in UC: Medications - No data to display  UC Diagnoses / Final Clinical Impressions(s)   I have reviewed the triage vital signs and the nursing  notes.  Pertinent labs & imaging results that were available during my care of the patient were reviewed by me and considered in my medical decision making (see chart for details).    Final diagnoses:  Streptococcal pharyngitis   Rapid strep test today is positive.  Patient provided with ibuprofen and lidocaine for pain relief.  Patient provided with a 10-day course of amoxicillin to resolve streptococcal infection.  Return precautions advised.  ED Prescriptions     Medication Sig Dispense Auth. Provider   amoxicillin (AMOXIL) 500 MG capsule Take 2 capsules (1,000 mg total) by mouth daily for 10 days. 20 capsule Lynden Oxford Scales, PA-C   lidocaine (XYLOCAINE) 2 % solution Use as directed 15 mLs in the mouth or throat every 3 (three) hours as needed for mouth pain (Sore throat). 300 mL Lynden Oxford Scales, PA-C   ibuprofen (ADVIL) 400 MG tablet Take 1 tablet (400 mg total) by mouth every 8 (eight) hours as needed for up to 30 doses. 30 tablet Lynden Oxford Scales, PA-C      PDMP not reviewed this encounter.  Disposition Upon Discharge:  Condition: stable for discharge home Home: take medications as prescribed; routine discharge instructions as discussed; follow up as advised.  Patient presented with an acute illness with associated systemic symptoms and significant discomfort requiring urgent management. In my opinion, this is a condition that a prudent lay person (someone who possesses an average knowledge of health and medicine) may potentially expect to result in complications if not addressed urgently such as respiratory distress, impairment of bodily function or dysfunction of bodily organs.   Routine symptom specific, illness specific and/or disease specific instructions were discussed with the patient and/or caregiver at length.   As such, the patient has been evaluated and assessed, work-up was performed and treatment was provided in alignment with urgent care protocols  and evidence based medicine.  Patient/parent/caregiver has been advised that the patient may require follow up for further testing and treatment if the symptoms continue in spite of treatment, as clinically indicated and appropriate.  If the patient was tested for COVID-19, Influenza and/or RSV, then the patient/parent/guardian was advised to isolate at home pending the results of his/her diagnostic coronavirus test and potentially longer if they're positive. I have also advised pt that if his/her COVID-19 test returns positive, it's recommended to self-isolate for at least 10 days after symptoms first appeared AND until fever-free for 24 hours without fever reducer AND other symptoms have improved or resolved. Discussed self-isolation recommendations as well as instructions for household member/close contacts as per the [facility] and [place] DHHS, and also gave patient the [place] packet with this information.  Patient/parent/caregiver has been advised  to return to the [facility] or PCP in 3-5 days if no better; to PCP or the Emergency Department if new signs and symptoms develop, or if the current signs or symptoms continue to change or worsen for further workup, evaluation and treatment as clinically indicated and appropriate  The patient will follow up with their current PCP if and as advised. If the patient does not currently have a PCP we will assist them in obtaining one.   The patient may need specialty follow up if the symptoms continue, in spite of conservative treatment and management, for further workup, evaluation, consultation and treatment as clinically indicated and appropriate.  Patient/parent/caregiver verbalized understanding and agreement of plan as discussed.  All questions were addressed during visit.  Please see discharge instructions below for further details of plan.  Discharge Instructions:   Discharge Instructions      Your strep test today is positive.  I recommend that you begin  antibiotics now for treatment.  I have sent a prescription to your pharmacy.  Please take them as prescribed.  You will begin to feel better in about 24 hours.  Please be sure that you you finish the entire 10-day course of treatment to avoid worsening infection that may require longer treatment with stronger antibiotics.   After 24 hours of taking antibiotics, you are no longer contagious.  Please discard your toothbrush as well as any other oral devices that you are currently using and replace them with new ones to avoid reinfection.   Advil, Motrin (ibuprofen): This is a good anti-inflammatory medication which addresses aches, pains and inflammation of the upper airways that causes sinus and nasal congestion as well as in the lower airways which makes your cough feel tight and sometimes burn.  I recommend that you take 400 mg every 6-8 hours as needed.      Xylocaine (lidocaine): This is a numbing medication that can be swished for 15 seconds and swallowed.  You can use this every 3 hours while awake to relieve pain in your mouth and throat.  I have sent a prescription for this medication to your pharmacy.   Please follow-up within the next 7-10 days either with your primary care provider or urgent care if your symptoms do not resolve.  If you do not have a primary care provider, we will assist you in finding one.        Thank you for visiting urgent care today.  We appreciate the opportunity to participate in your care.            This office note has been dictated using Museum/gallery curator.  Unfortunately, this method of dictation can sometimes lead to typographical or grammatical errors.  I apologize for your inconvenience in advance if this occurs.  Please do not hesitate to reach out to me if clarification is needed.      Lynden Oxford Scales, [place] 04/06/22 [phone]

## 2022-06-05 IMAGING — US US MFM OB FOLLOW-UP
1 series · 14 of 28 positions shown · non-contrast
Comparison: none

[Series 1: us mfm ob follow-up · 39 acquisitions, 14 frames shown]
[im 2/39]
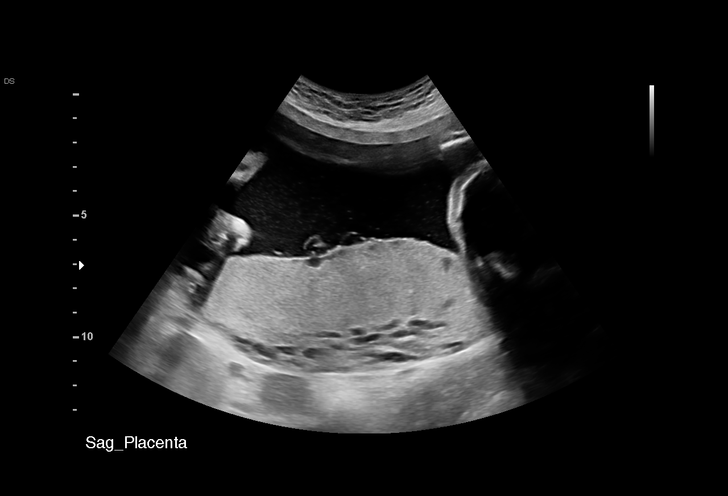
[im 5/39]
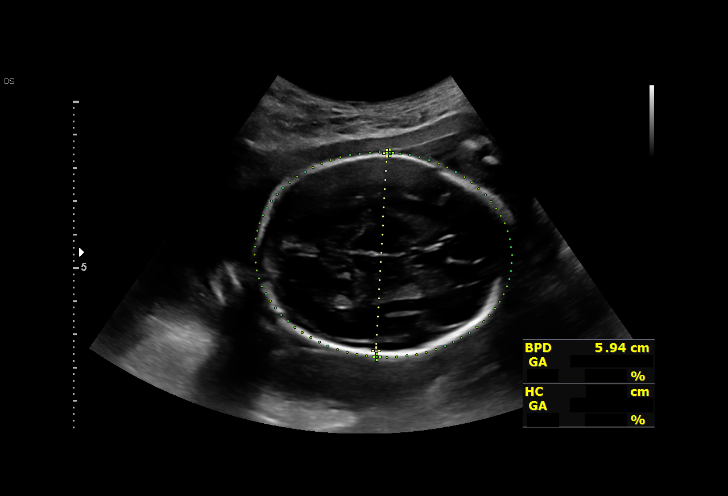
[im 8/39]
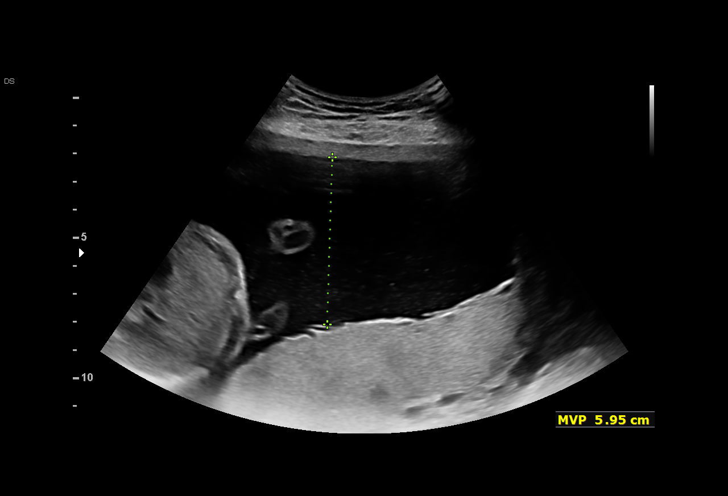
[im 10/39]
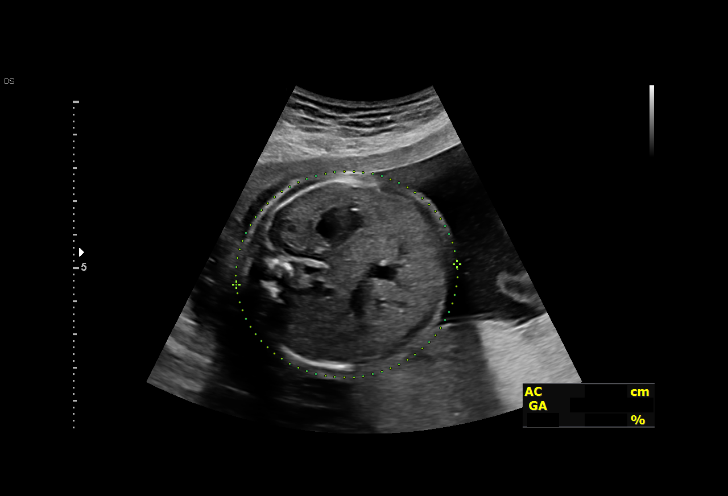
[im 13/39]
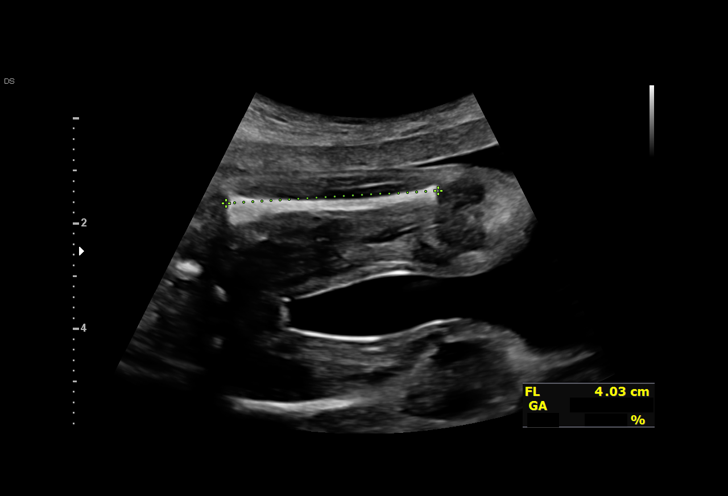
[im 16/39]
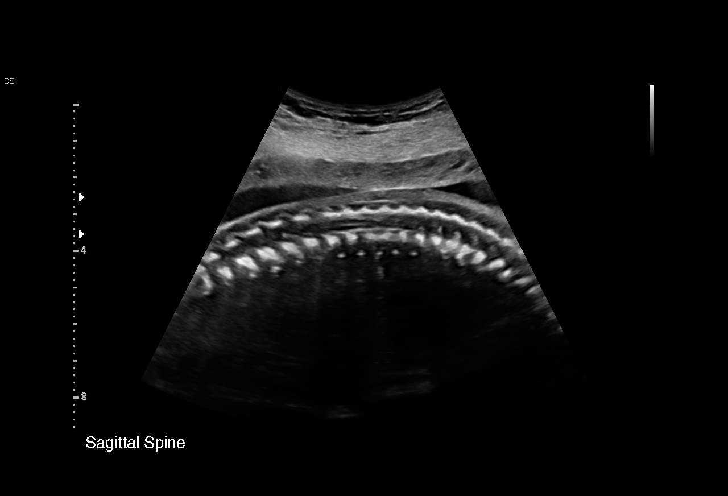
[im 19/39]
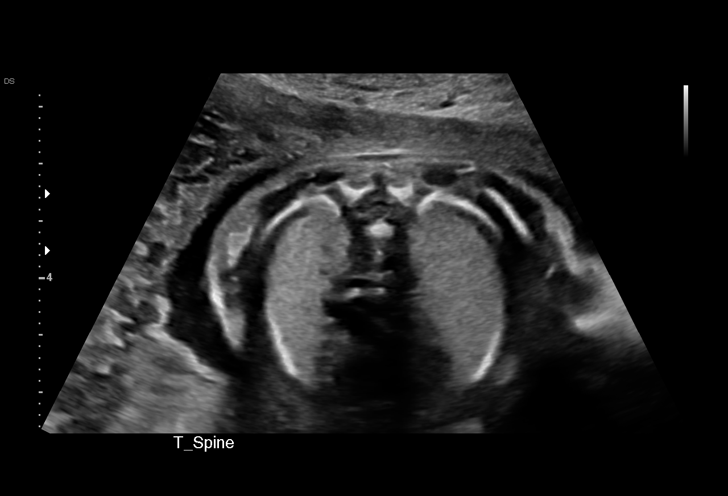
[im 22/39]
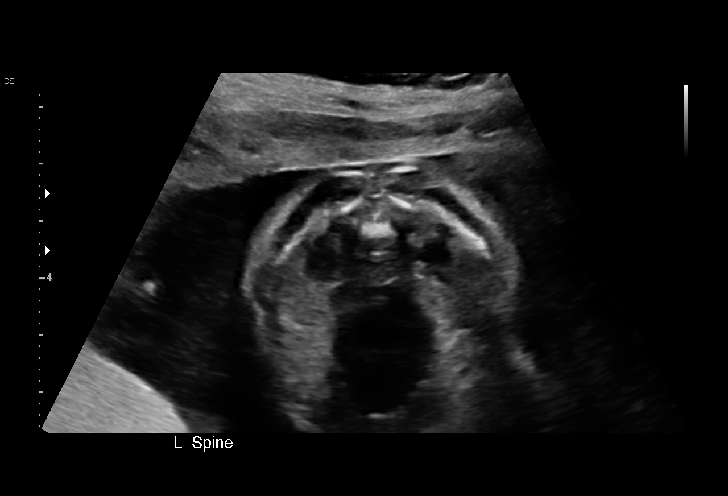
[im 24/39]
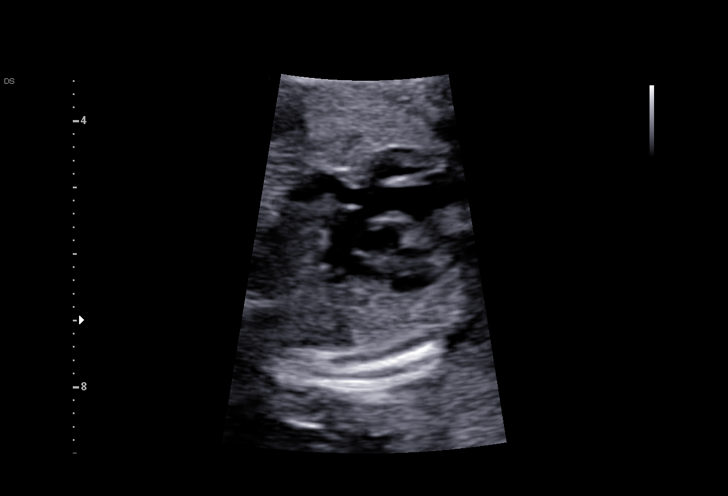
[im 27/39]
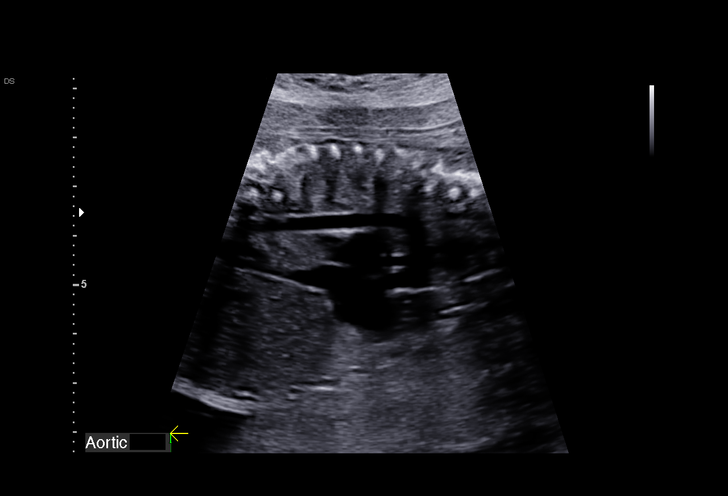
[im 30/39]
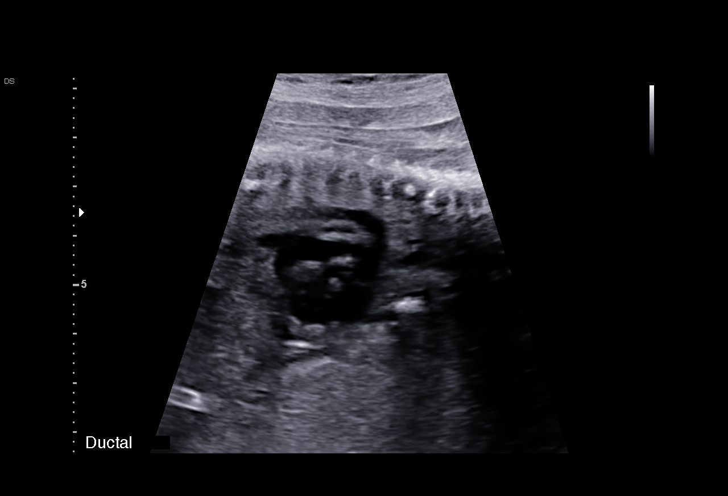
[im 33/39]
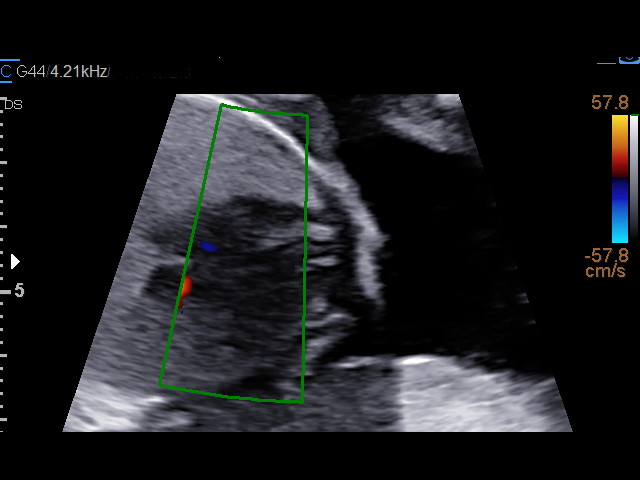
[im 36/39]
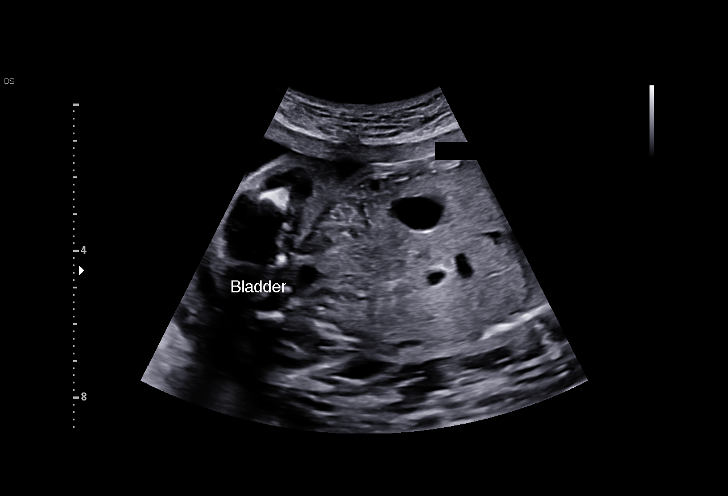
[im 39/39]
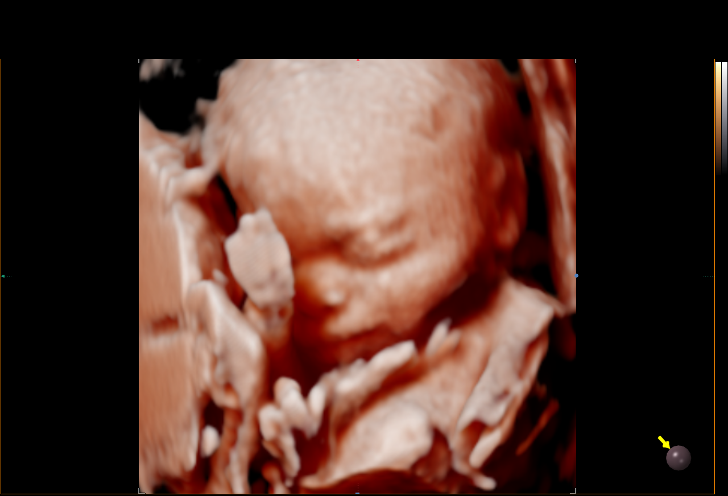

[14 of 28 positions shown; findings below may reference images not displayed]

Indications

 Advanced maternal age multigravida 35+,
 second trimester
 Genetic carrier (Hemoglobin C)
 Encounter for other antenatal screening
 follow-up
 23 weeks gestation of pregnancy
 Low risk Nips
Fetal Evaluation

 Num Of Fetuses:         1
 Cardiac Activity:       Observed
 Presentation:           Cephalic
 Placenta:               Posterior
 P. Cord Insertion:      Previously Visualized

 Amniotic Fluid
 AFI FV:      Within normal limits

                             Largest Pocket(cm)

Biometry

 BPD:      59.6  mm     G. Age:  24w 2d         77  %    CI:        74.25   %    70 - 86
                                                         FL/HC:      18.1   %    19.2 -
 HC:      219.6  mm     G. Age:  24w 0d         57  %    HC/AC:      1.10        1.05 -
 AC:      199.6  mm     G. Age:  24w 4d         78  %    FL/BPD:     66.8   %    71 - 87
 FL:       39.8  mm     G. Age:  22w 6d         20  %    FL/AC:      19.9   %    20 - 24
 CER:      26.7  mm     G. Age:  24w 0d         85  %

 LV:        4.7  mm
 Est. FW:     638  gm      1 lb 7 oz     64  %
OB History

 Gravidity:    9         Term:   5
 Living:       5
Gestational Age

 LMP:           23w 3d        Date:  03/12/20                 EDD:   12/17/20
 U/S Today:     24w 0d                                        EDD:   12/13/20
 Best:          23w 3d     Det. By:  LMP  (03/12/20)          EDD:   12/17/20
Anatomy

 Cranium:               Appears normal         LVOT:                   Appears normal
 Cavum:                 Appears normal         Aortic Arch:            Appears normal
 Ventricles:            Appears normal         Ductal Arch:            Appears normal
 Choroid Plexus:        Appears normal         Diaphragm:              Previously seen
 Cerebellum:            Appears normal         Stomach:                Appears normal, left
                                                                       sided
 Posterior Fossa:       Appears normal         Abdomen:                Previously seen
 Nuchal Fold:           Not applicable (>20    Abdominal Wall:         Previously seen
                        wks GA)
 Face:                  Appears normal         Cord Vessels:           Previously seen
                        (orbits and profile)
 Lips:                  Appears normal         Kidneys:                Appear normal
 Palate:                Appears normal         Bladder:                Appears normal
 Thoracic:              Previously seen        Spine:                  Appears normal
 Heart:                 Appears normal         Upper Extremities:      Previously seen
                        (4CH, axis, and
                        situs)
 RVOT:                  Appears normal         Lower Extremities:      Previously seen

 Other:  Female gender previously seen. Heels/feet and open hands/5th digits
         visualized. VC, 3VV and 3VTV visualized.
Cervix Uterus Adnexa

 Cervix
 Length:           3.49  cm.
 Normal appearance by transabdominal scan.

 Right Ovary
 Within normal limits.

 Left Ovary
 Within normal limits.
Impression

 Patient returned for completion of fetal anatomy .Fetal growth
 is appropriate for gestational age .Amniotic fluid is normal
 and good fetal activity is seen .Fetal anatomical survey was
 completed and appears normal.
Recommendations

 -An appointment was made for her to return in 9 weeks for
 fetal growth assessment (AMA).
                 [place], [place]

## 2023-02-14 ENCOUNTER — Other Ambulatory Visit: Payer: Self-pay

## 2023-02-14 ENCOUNTER — Encounter (HOSPITAL_COMMUNITY): Payer: Self-pay

## 2023-02-14 ENCOUNTER — Emergency Department (HOSPITAL_COMMUNITY)
Admission: EM | Admit: 2023-02-14 | Discharge: 2023-02-14 | Disposition: A | Discharge status: Home / Self Care | Payer: Self-pay | Attending: Emergency Medicine | Admitting: Emergency Medicine

## 2023-02-14 DIAGNOSIS — R200 Anesthesia of skin: Secondary | ICD-10-CM | POA: Insufficient documentation

## 2023-02-14 DIAGNOSIS — M542 Cervicalgia: Secondary | ICD-10-CM | POA: Insufficient documentation

## 2023-02-14 DIAGNOSIS — R531 Weakness: Secondary | ICD-10-CM | POA: Insufficient documentation

## 2023-02-14 DIAGNOSIS — R202 Paresthesia of skin: Secondary | ICD-10-CM | POA: Insufficient documentation

## 2023-02-14 MED ORDER — HYDROCODONE-ACETAMINOPHEN 5-325 MG PO TABS
1.0000 | ORAL_TABLET | Freq: Once | ORAL | Status: AC
  Administered 2023-02-14: 1 via ORAL
  Filled 2023-02-14: qty 1

## 2023-02-14 MED ORDER — LIDOCAINE 5 % EX PTCH
1.0000 | MEDICATED_PATCH | CUTANEOUS | Status: DC
  Administered 2023-02-14: 1 via TRANSDERMAL
  Filled 2023-02-14: qty 1

## 2023-02-14 MED ORDER — METHOCARBAMOL 500 MG PO TABS: 500.0000 mg | ORAL_TABLET | Freq: Two times a day (BID) | ORAL | 0 refills | Status: DC

## 2023-02-14 MED ORDER — METHOCARBAMOL 500 MG PO TABS
500.0000 mg | ORAL_TABLET | Freq: Once | ORAL | Status: AC
  Administered 2023-02-14: 500 mg via ORAL
  Filled 2023-02-14: qty 1

## 2023-02-14 MED ORDER — NAPROXEN 500 MG PO TABS: 500.0000 mg | ORAL_TABLET | Freq: Two times a day (BID) | ORAL | 0 refills | Status: DC

## 2023-02-14 NOTE — ED Provider Notes (Signed)
[place] EMERGENCY DEPARTMENT AT [facility] Provider Note   CSN: [id number] Arrival date & time: 02/14/23  7829     History  Chief Complaint  Patient presents with   Neck Pain    Started yesterday    Veronica Green is a 42 y.o. female with a past medical history significant for anxiety and sickle cell trait who presents to the ED due to right-sided neck pain that radiates into right shoulder x 1 day.  Patient states she woke up with the pain yesterday.  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  No chest pain or shortness of breath.  Admits to some numbness/tingling of right upper extremity.  No chronic neck issues.  Admits to weakness secondary to pain.  No direct trauma to right neck/shoulder.  Denies fever and chills.  No IV drug use.  History obtained from patient and past medical records. No interpreter used during encounter.       Home Medications Prior to Admission medications   Medication Sig Start Date End Date Taking? Authorizing Provider  methocarbamol (ROBAXIN) 500 MG tablet Take 1 tablet (500 mg total) by mouth 2 (two) times daily. 02/14/23  Yes Birney Belshe C, PA-C  naproxen (NAPROSYN) 500 MG tablet Take 1 tablet (500 mg total) by mouth 2 (two) times daily. 02/14/23  Yes Dreden Rivere, Merla Riches, PA-C  acetaminophen (TYLENOL) 325 MG tablet Take 2 tablets (650 mg total) by mouth every 4 (four) hours as needed (for pain scale < 4). 11/30/20   Lodema Hong, Danielle L, CNM  benzocaine-Menthol (DERMOPLAST) 20-0.5 % AERO Apply 1 application topically as needed for irritation (perineal discomfort). 11/30/20   Brand Males, CNM  Blood Pressure Monitor KIT 1 Device by Does not apply route once a week. To be monitored Regularly at home. 06/06/20   Adam Phenix, MD  docusate sodium (COLACE) 100 MG capsule Take 1 capsule (100 mg total) by mouth daily. 11/02/20   Reva Bores, MD  hydrOXYzine (ATARAX/VISTARIL) 25 MG tablet Take 1 tablet (25 mg  total) by mouth every 6 (six) hours. 11/01/20   Rasch, Victorino Dike I, NP  ibuprofen (ADVIL) 400 MG tablet Take 1 tablet (400 mg total) by mouth every 8 (eight) hours as needed for up to 30 doses. 04/05/22   Theadora Rama Scales, PA-C  lidocaine (XYLOCAINE) 2 % solution Use as directed 15 mLs in the mouth or throat every 3 (three) hours as needed for mouth pain (Sore throat). 04/05/22   Theadora Rama Scales, PA-C  metoCLOPramide (REGLAN) 10 MG tablet Take 1 tablet (10 mg total) by mouth 3 (three) times daily with meals. 11/01/20 11/01/21  Nugent, Odie Sera, NP  pantoprazole (PROTONIX) 20 MG tablet Take 1 tablet (20 mg total) by mouth 2 (two) times daily. 11/08/20   Adam Phenix, MD  potassium chloride (KLOR-CON) 10 MEQ tablet Take 1 tablet (10 mEq total) by mouth 2 (two) times daily. 11/08/20   Adam Phenix, MD  Prenatal Vit-Fe Fumarate-FA (PREPLUS) 27-1 MG TABS Take 1 tablet by mouth daily. 06/14/20   Constant, Gigi Gin, MD  witch hazel-glycerin (TUCKS) pad Apply 1 application topically as needed for hemorrhoids. 11/30/20   Brand Males, CNM      Allergies    Patient has no known allergies.    Review of Systems   Review of Systems  Constitutional:  Negative for fever.  Respiratory:  Negative for shortness of breath.   Cardiovascular:  Negative for  chest pain.  Musculoskeletal:  Positive for arthralgias, back pain and neck pain.    Physical Exam Updated Vital Signs BP 103/71 (BP Location: Right Arm)   Pulse 74   Temp 98 F (36.7 C) (Oral)   Resp 18   Ht 5\' 4"  (1.626 m)   Wt 62.3 kg   LMP 01/17/2023 (Exact Date)   SpO2 100%   BMI 23.58 kg/m  Physical Exam Vitals and nursing note reviewed.  Constitutional:      General: She is not in acute distress.    Appearance: She is not ill-appearing.  HENT:     Head: Normocephalic.  Eyes:     Pupils: Pupils are equal, round, and reactive to light.  Neck:     Comments: No cervical midline tenderness. Reproducible tenderness throughout  right trapezius muscle.  Right upper extremity neurovascularly intact with soft compartments. Equal grip strength.  Cardiovascular:     Rate and Rhythm: Normal rate and regular rhythm.     Pulses: Normal pulses.     Heart sounds: Normal heart sounds. No murmur heard.    No friction rub. No gallop.  Pulmonary:     Effort: Pulmonary effort is normal.     Breath sounds: Normal breath sounds.  Abdominal:     General: Abdomen is flat. There is no distension.     Palpations: Abdomen is soft.     Tenderness: There is no abdominal tenderness. There is no guarding or rebound.  Musculoskeletal:        General: Normal range of motion.     Cervical back: Neck supple.  Skin:    General: Skin is warm and dry.  Neurological:     General: No focal deficit present.     Mental Status: She is alert.  Psychiatric:        Mood and Affect: Mood normal.        Behavior: Behavior normal.     ED Results / Procedures / Treatments   Labs (all labs ordered are listed, but only abnormal results are displayed) Labs Reviewed - No data to display  EKG None  Radiology No results found.  Procedures Procedures    Medications Ordered in ED Medications  lidocaine (LIDODERM) 5 % 1 patch (1 patch Transdermal Patch Applied 02/14/23 1025)  methocarbamol (ROBAXIN) tablet 500 mg (500 mg Oral Given 02/14/23 1025)  HYDROcodone-acetaminophen (NORCO/VICODIN) 5-325 MG per tablet 1 tablet (1 tablet Oral Given 02/14/23 1025)    ED Course/ Medical Decision Making/ A&P                                 Medical Decision Making Risk Prescription drug management.   42 year old female presents to the ED due to right-sided neck and shoulder pain that started yesterday.  Admits heavy lifting at her job.  No direct injury.  No fever or chills.  No IV drug use.  Upon arrival, vitals all within normal limits.  Patient in no acute distress.  No cervical midline tenderness.  Equal grip strength.  Reproducible tenderness  throughout right trapezius muscle.  Suspect muscular strain.  Low suspicion for bony fracture given no direct injury.  Low suspicion for carotid dissection. No weakness appreciated on exam. Low suspicion for central cord compression. Pain medication given. Offered x-rays; however patient declined and notes she needs to leave, but will return if symptoms worsen.  Upon reassessment, patient admits to improvement in pain.  Full  range of motion of right upper extremity.  No infectious symptoms to suggest infectious etiology.  Will discharge patient with symptomatic treatment.  Suspect muscular etiology.  Patient stable for discharge. Strict ED precautions discussed with patient. Patient states understanding and agrees to plan. Patient discharged home in no acute distress and stable vitals  Lives at home No PCP Hx anxiety       Final Clinical Impression(s) / ED Diagnoses Final diagnoses:  Neck pain    Rx / DC Orders ED Discharge Orders          Ordered    methocarbamol (ROBAXIN) 500 MG tablet  2 times daily        02/14/23 1101    naproxen (NAPROSYN) 500 MG tablet  2 times daily        02/14/23 1101              Mannie Stabile, [place] 02/14/23 1102    Wynetta Fines, MD 02/14/23 1609

## 2023-02-14 NOTE — Discharge Instructions (Signed)
It was a pleasure taking care of you today.  As discussed, I suspect your symptoms are muscular in nature.  I am sending you home with pain medication and a muscle relaxer.  Take as needed for pain.  Muscle relaxer can cause drowsiness so do not drive or operate machinery while on the medication.  Please follow-up with PCP in 2 to 3 days if symptoms do not improved.  Return to the ER for new or worsening symptoms.

## 2023-02-14 NOTE — ED Triage Notes (Signed)
Patient arrived POV, states woke up yesterday with R side neck pain. Denies known injury. States she took Motrin last night, no relief.

## 2024-06-15 ENCOUNTER — Emergency Department (HOSPITAL_COMMUNITY)
Admission: EM | Admit: 2024-06-15 | Discharge: 2024-06-15 | Disposition: A | Discharge status: Home / Self Care | Payer: Self-pay | Attending: Emergency Medicine | Admitting: Emergency Medicine

## 2024-06-15 ENCOUNTER — Other Ambulatory Visit: Payer: Self-pay

## 2024-06-15 DIAGNOSIS — R109 Unspecified abdominal pain: Secondary | ICD-10-CM | POA: Diagnosis not present

## 2024-06-15 DIAGNOSIS — E876 Hypokalemia: Secondary | ICD-10-CM | POA: Insufficient documentation

## 2024-06-15 DIAGNOSIS — R112 Nausea with vomiting, unspecified: Secondary | ICD-10-CM | POA: Insufficient documentation

## 2024-06-15 LAB — HCG, SERUM, QUALITATIVE: Preg, Serum: NEGATIVE

## 2024-06-15 LAB — CBC
HCT: 36.2 % (ref 36.0–46.0)
Hemoglobin: 13.3 g/dL (ref 12.0–15.0)
MCH: 31.8 pg (ref 26.0–34.0)
MCHC: 36.7 g/dL — ABNORMAL HIGH (ref 30.0–36.0)
MCV: 86.6 fL (ref 80.0–100.0)
Platelets: 373 K/uL (ref 150–400)
RBC: 4.18 MIL/uL (ref 3.87–5.11)
RDW: 12.3 % (ref 11.5–15.5)
WBC: 12.7 K/uL — ABNORMAL HIGH (ref 4.0–10.5)
nRBC: 0 % (ref 0.0–0.2)

## 2024-06-15 LAB — COMPREHENSIVE METABOLIC PANEL WITH GFR
ALT: 31 U/L (ref 0–44)
AST: 36 U/L (ref 15–41)
Albumin: 4.5 g/dL (ref 3.5–5.0)
Alkaline Phosphatase: 65 U/L (ref 38–126)
Anion gap: 11 (ref 5–15)
BUN: 9 mg/dL (ref 6–20)
CO2: 26 mmol/L (ref 22–32)
Calcium: 9.7 mg/dL (ref 8.9–10.3)
Chloride: 105 mmol/L (ref 98–111)
Creatinine, Ser: 0.65 mg/dL (ref 0.44–1.00)
GFR, Estimated: >60 mL/min
Glucose, Bld: 165 mg/dL — ABNORMAL HIGH (ref 70–99)
Potassium: 3.2 mmol/L — ABNORMAL LOW (ref 3.5–5.1)
Sodium: 141 mmol/L (ref 135–145)
Total Bilirubin: 0.4 mg/dL (ref 0.0–1.2)
Total Protein: 7.4 g/dL (ref 6.5–8.1)

## 2024-06-15 LAB — LIPASE, BLOOD: Lipase: 18 U/L (ref 11–51)

## 2024-06-15 MED ORDER — POTASSIUM CHLORIDE CRYS ER 20 MEQ PO TBCR: 20.0000 meq | EXTENDED_RELEASE_TABLET | Freq: Two times a day (BID) | ORAL | 0 refills | Status: DC

## 2024-06-15 MED ORDER — METOCLOPRAMIDE HCL 10 MG PO TABS: 10.0000 mg | ORAL_TABLET | Freq: Four times a day (QID) | ORAL | 0 refills | Status: AC

## 2024-06-15 MED ORDER — DIPHENHYDRAMINE HCL 50 MG/ML IJ SOLN
12.5000 mg | Freq: Once | INTRAMUSCULAR | Status: AC
  Administered 2024-06-15: 12.5 mg via INTRAVENOUS
  Filled 2024-06-15: qty 1

## 2024-06-15 MED ORDER — SODIUM CHLORIDE 0.9 % IV BOLUS
1000.0000 mL | Freq: Once | INTRAVENOUS | Status: AC
  Administered 2024-06-15: 1000 mL via INTRAVENOUS

## 2024-06-15 MED ORDER — ONDANSETRON HCL 4 MG/2ML IJ SOLN
4.0000 mg | Freq: Once | INTRAMUSCULAR | Status: AC
  Administered 2024-06-15: 4 mg via INTRAVENOUS
  Filled 2024-06-15: qty 2

## 2024-06-15 MED ORDER — METOCLOPRAMIDE HCL 5 MG/ML IJ SOLN
10.0000 mg | Freq: Once | INTRAMUSCULAR | Status: AC
  Administered 2024-06-15: 10 mg via INTRAVENOUS
  Filled 2024-06-15: qty 2

## 2024-06-15 NOTE — Discharge Instructions (Signed)
 Take Reglan  for nausea and any abdominal discomfort secondary to vomiting. Push fluids in small amounts to avoid dehydration. Your potassium was a little low tonight and you have been given a supplement to treat this.   Return to the ED with any new or worsening symptoms at any time.

## 2024-06-15 NOTE — ED Triage Notes (Signed)
 Patient BIB EMS from home c/o generalized abdominal pain x 2 hours. Patient report nausea and vomiting x 2 this morning. Patient states she smoked weed tonight. Patient denies dysuria.

## 2024-06-15 NOTE — ED Provider Notes (Signed)
 " [place] EMERGENCY DEPARTMENT AT [facility] Provider Note   CSN: [id number] Arrival date & time: 06/15/24  0245     Patient presents with: Abdominal Pain   Veronica Green is a 44 y.o. female.   Patient to ED with 2 hours of nausea, vomiting. She reports it started in the lower abdomen, felt like cramps, and now is just nausea. She denies symptoms are recurrent. No fever. No hematemesis. No diarrhea. Per chart review, history of regular marijuana use.   The history is provided by the patient. No language interpreter was used.  Abdominal Pain      Prior to Admission medications  Medication Sig Start Date End Date Taking? Authorizing Provider  metoCLOPramide  (REGLAN ) 10 MG tablet Take 1 tablet (10 mg total) by mouth every 6 (six) hours. 06/15/24  Yes Janelie Goltz, Margit, PA-C  potassium chloride  SA (KLOR-CON  M) 20 MEQ tablet Take 1 tablet (20 mEq total) by mouth 2 (two) times daily. 06/15/24 06/18/24 Yes Uzma Hellmer, Margit, PA-C  acetaminophen  (TYLENOL ) 325 MG tablet Take 2 tablets (650 mg total) by mouth every 4 (four) hours as needed (for pain scale < 4). 11/30/20   Antonetta Edsel CROME, CNM  benzocaine -Menthol  (DERMOPLAST) 20-0.5 % AERO Apply 1 application topically as needed for irritation (perineal discomfort). 11/30/20   Antonetta Edsel CROME, CNM  Blood Pressure Monitor KIT 1 Device by Does not apply route once a week. To be monitored Regularly at home. 06/06/20   Eveline Lynwood MATSU, MD  docusate sodium  (COLACE) 100 MG capsule Take 1 capsule (100 mg total) by mouth daily. 11/02/20   Fredirick Glenys RAMAN, MD  hydrOXYzine  (ATARAX /VISTARIL ) 25 MG tablet Take 1 tablet (25 mg total) by mouth every 6 (six) hours. 11/01/20   Rasch, Delon I, NP  ibuprofen  (ADVIL ) 400 MG tablet Take 1 tablet (400 mg total) by mouth every 8 (eight) hours as needed for up to 30 doses. 04/05/22   Joesph Shaver Scales, PA-C  lidocaine  (XYLOCAINE ) 2 % solution Use as directed 15 mLs in the mouth or throat every 3 (three)  hours as needed for mouth pain (Sore throat). 04/05/22   Joesph Shaver Scales, PA-C  methocarbamol  (ROBAXIN ) 500 MG tablet Take 1 tablet (500 mg total) by mouth 2 (two) times daily. 02/14/23   Lorelle Aleck BROCKS, PA-C  naproxen  (NAPROSYN ) 500 MG tablet Take 1 tablet (500 mg total) by mouth 2 (two) times daily. 02/14/23   Lorelle Aleck BROCKS, PA-C  pantoprazole  (PROTONIX ) 20 MG tablet Take 1 tablet (20 mg total) by mouth 2 (two) times daily. 11/08/20   Eveline Lynwood MATSU, MD  Prenatal Vit-Fe Fumarate-FA (PREPLUS) 27-1 MG TABS Take 1 tablet by mouth daily. 06/14/20   Constant, Peggy, MD  witch hazel-glycerin  (TUCKS) pad Apply 1 application topically as needed for hemorrhoids. 11/30/20   Antonetta Edsel CROME, CNM    Allergies: Patient has no known allergies.    Review of Systems  Gastrointestinal:  Positive for abdominal pain.    Updated Vital Signs BP 127/82   Pulse 62   Temp (!) 96.9 F (36.1 C) (Rectal)   Resp 19   SpO2 100%   Physical Exam Constitutional:      Appearance: She is well-developed.  HENT:     Head: Normocephalic.  Cardiovascular:     Rate and Rhythm: Normal rate.  Pulmonary:     Effort: Pulmonary effort is normal.  Abdominal:     General: There is no distension.     Palpations: Abdomen is soft.  Tenderness: There is no abdominal tenderness. There is no guarding or rebound.  Musculoskeletal:        General: Normal range of motion.     Cervical back: Normal range of motion and neck supple.  Skin:    General: Skin is warm and dry.  Neurological:     General: No focal deficit present.     Mental Status: She is alert and oriented to person, place, and time.     (all labs ordered are listed, but only abnormal results are displayed) Labs Reviewed  COMPREHENSIVE METABOLIC PANEL WITH GFR - Abnormal; Notable for the following components:      Result Value   Potassium 3.2 (*)    Glucose, Bld 165 (*)    All other components within normal limits  CBC - Abnormal; Notable  for the following components:   WBC 12.7 (*)    MCHC 36.7 (*)    All other components within normal limits  LIPASE, BLOOD  HCG, SERUM, QUALITATIVE  URINALYSIS, ROUTINE W REFLEX MICROSCOPIC    EKG: None  Radiology: No results found.   Procedures   Medications Ordered in the ED  sodium chloride  0.9 % bolus 1,000 mL (1,000 mLs Intravenous New Bag/Given 06/15/24 0427)  ondansetron  (ZOFRAN ) injection 4 mg (4 mg Intravenous Given 06/15/24 0426)  metoCLOPramide  (REGLAN ) injection 10 mg (10 mg Intravenous Given 06/15/24 0449)  diphenhydrAMINE  (BENADRYL ) injection 12.5 mg (12.5 mg Intravenous Given 06/15/24 0449)    Clinical Course as of 06/15/24 0554  Thu Jun 15, 2024  0523 Patient to ED with nausea and vomiting. Denies significant abdominal pain [SU]  0549 Abdominal exam is benign. VSS. She is tolerating PO fluids without vomiting. Labs are reassuring. Mild hypokalemia which will be supplemented. Mild leukocytosis felt reactive without fever or other signs of illness. Negative pregnancy. She is comfortable with plan to discharge home. Rx Reglan  provided.  [SU]    Clinical Course User Index [SU] Odell Balls, PA-C                                 Medical Decision Making Amount and/or Complexity of Data Reviewed Labs: ordered.  Risk Prescription drug management.        Final diagnoses:  Nausea and vomiting, unspecified vomiting type    ED Discharge Orders          Ordered    metoCLOPramide  (REGLAN ) 10 MG tablet  Every 6 hours        06/15/24 0552    potassium chloride  SA (KLOR-CON  M) 20 MEQ tablet  2 times daily        06/15/24 0552               Odell Balls, PA-C 06/15/24 0554    Raford Lenis, MD 06/15/24 [id number]  "

## 2024-06-17 ENCOUNTER — Emergency Department (HOSPITAL_COMMUNITY)
Admission: EM | Admit: 2024-06-17 | Discharge: 2024-06-17 | Disposition: A | Discharge status: Home / Self Care | Payer: Self-pay

## 2024-06-17 ENCOUNTER — Observation Stay (HOSPITAL_COMMUNITY)
Admission: EM | Admit: 2024-06-17 | Discharge: 2024-06-19 | Disposition: A | Payer: Self-pay | Attending: Emergency Medicine | Admitting: Emergency Medicine

## 2024-06-17 ENCOUNTER — Other Ambulatory Visit: Payer: Self-pay

## 2024-06-17 ENCOUNTER — Encounter (HOSPITAL_COMMUNITY): Payer: Self-pay

## 2024-06-17 DIAGNOSIS — D72829 Elevated white blood cell count, unspecified: Secondary | ICD-10-CM | POA: Insufficient documentation

## 2024-06-17 DIAGNOSIS — R109 Unspecified abdominal pain: Secondary | ICD-10-CM | POA: Diagnosis present

## 2024-06-17 DIAGNOSIS — E876 Hypokalemia: Secondary | ICD-10-CM | POA: Insufficient documentation

## 2024-06-17 DIAGNOSIS — R1110 Vomiting, unspecified: Secondary | ICD-10-CM | POA: Diagnosis present

## 2024-06-17 DIAGNOSIS — Z79899 Other long term (current) drug therapy: Secondary | ICD-10-CM | POA: Insufficient documentation

## 2024-06-17 DIAGNOSIS — R112 Nausea with vomiting, unspecified: Secondary | ICD-10-CM | POA: Diagnosis not present

## 2024-06-17 DIAGNOSIS — Z87891 Personal history of nicotine dependence: Secondary | ICD-10-CM | POA: Diagnosis not present

## 2024-06-17 LAB — COMPREHENSIVE METABOLIC PANEL WITH GFR
ALT: 43 U/L (ref 0–44)
AST: 33 U/L (ref 15–41)
Albumin: 4.6 g/dL (ref 3.5–5.0)
Alkaline Phosphatase: 55 U/L (ref 38–126)
Anion gap: 13 (ref 5–15)
BUN: 11 mg/dL (ref 6–20)
CO2: 26 mmol/L (ref 22–32)
Calcium: 9.4 mg/dL (ref 8.9–10.3)
Chloride: 104 mmol/L (ref 98–111)
Creatinine, Ser: 0.65 mg/dL (ref 0.44–1.00)
GFR, Estimated: >60 mL/min
Glucose, Bld: 99 mg/dL (ref 70–99)
Potassium: 3.5 mmol/L (ref 3.5–5.1)
Sodium: 142 mmol/L (ref 135–145)
Total Bilirubin: 0.7 mg/dL (ref 0.0–1.2)
Total Protein: 7.6 g/dL (ref 6.5–8.1)

## 2024-06-17 LAB — URINALYSIS, ROUTINE W REFLEX MICROSCOPIC
Bacteria, UA: NONE SEEN
Bilirubin Urine: NEGATIVE
Glucose, UA: NEGATIVE mg/dL
Ketones, ur: 20 mg/dL — AB
Leukocytes,Ua: NEGATIVE
Nitrite: NEGATIVE
Protein, ur: 100 mg/dL — AB
Specific Gravity, Urine: 1.028 (ref 1.005–1.030)
pH: 5 (ref 5.0–8.0)

## 2024-06-17 LAB — CBC WITH DIFFERENTIAL/PLATELET
Abs Immature Granulocytes: 0.04 K/uL (ref 0.00–0.07)
Basophils Absolute: 0 K/uL (ref 0.0–0.1)
Basophils Relative: 0 %
Eosinophils Absolute: 0 K/uL (ref 0.0–0.5)
Eosinophils Relative: 0 %
HCT: 36.4 % (ref 36.0–46.0)
Hemoglobin: 13.2 g/dL (ref 12.0–15.0)
Immature Granulocytes: 0 %
Lymphocytes Relative: 26 %
Lymphs Abs: 2.7 K/uL (ref 0.7–4.0)
MCH: 31.2 pg (ref 26.0–34.0)
MCHC: 36.3 g/dL — ABNORMAL HIGH (ref 30.0–36.0)
MCV: 86.1 fL (ref 80.0–100.0)
Monocytes Absolute: 0.8 K/uL (ref 0.1–1.0)
Monocytes Relative: 8 %
Neutro Abs: 7 K/uL (ref 1.7–7.7)
Neutrophils Relative %: 66 %
Platelets: 336 K/uL (ref 150–400)
RBC: 4.23 MIL/uL (ref 3.87–5.11)
RDW: 12.5 % (ref 11.5–15.5)
WBC: 10.6 K/uL — ABNORMAL HIGH (ref 4.0–10.5)
nRBC: 0 % (ref 0.0–0.2)

## 2024-06-17 LAB — URINE DRUG SCREEN
Amphetamines: NEGATIVE
Barbiturates: NEGATIVE
Benzodiazepines: NEGATIVE
Cocaine: NEGATIVE
Fentanyl: NEGATIVE
Methadone Scn, Ur: NEGATIVE
Opiates: NEGATIVE
Tetrahydrocannabinol: POSITIVE — AB

## 2024-06-17 LAB — RESP PANEL BY RT-PCR (RSV, FLU A&B, COVID)  RVPGX2
Influenza A by PCR: NEGATIVE
Influenza B by PCR: NEGATIVE
Resp Syncytial Virus by PCR: NEGATIVE
SARS Coronavirus 2 by RT PCR: NEGATIVE

## 2024-06-17 LAB — LIPASE, BLOOD: Lipase: 19 U/L (ref 11–51)

## 2024-06-17 LAB — HCG, SERUM, QUALITATIVE: Preg, Serum: NEGATIVE

## 2024-06-17 MED ORDER — LACTATED RINGERS IV BOLUS
1000.0000 mL | Freq: Once | INTRAVENOUS | Status: AC
  Administered 2024-06-17: 1000 mL via INTRAVENOUS

## 2024-06-17 MED ORDER — ACETAMINOPHEN 325 MG PO TABS
650.0000 mg | ORAL_TABLET | Freq: Four times a day (QID) | ORAL | Status: DC | PRN
  Administered 2024-06-17 – 2024-06-18 (×2): 650 mg via ORAL
  Filled 2024-06-17 (×2): qty 2

## 2024-06-17 MED ORDER — ONDANSETRON 4 MG PO TBDP: 4.0000 mg | ORAL_TABLET | Freq: Three times a day (TID) | ORAL | 0 refills | Status: DC | PRN

## 2024-06-17 MED ORDER — ACETAMINOPHEN 650 MG RE SUPP: 650.0000 mg | Freq: Four times a day (QID) | RECTAL | Status: DC | PRN

## 2024-06-17 MED ORDER — ONDANSETRON HCL 4 MG PO TABS: 4.0000 mg | ORAL_TABLET | Freq: Four times a day (QID) | ORAL | Status: DC | PRN

## 2024-06-17 MED ORDER — POLYETHYLENE GLYCOL 3350 17 G PO PACK: 17.0000 g | PACK | Freq: Every day | ORAL | Status: DC | PRN

## 2024-06-17 MED ORDER — KCL IN DEXTROSE-NACL 10-5-0.45 MEQ/L-%-% IV SOLN
INTRAVENOUS | Status: AC
  Filled 2024-06-17 (×4): qty 1000

## 2024-06-17 MED ORDER — OXYCODONE HCL 5 MG PO TABS: 5.0000 mg | ORAL_TABLET | ORAL | Status: DC | PRN

## 2024-06-17 MED ORDER — ONDANSETRON HCL 4 MG/2ML IJ SOLN
4.0000 mg | Freq: Once | INTRAMUSCULAR | Status: AC
  Administered 2024-06-17: 4 mg via INTRAVENOUS
  Filled 2024-06-17: qty 2

## 2024-06-17 MED ORDER — SCOPOLAMINE 1 MG/3DAYS TD PT72
1.0000 | MEDICATED_PATCH | TRANSDERMAL | Status: DC
  Administered 2024-06-17: 1 mg via TRANSDERMAL
  Filled 2024-06-17: qty 1

## 2024-06-17 MED ORDER — ONDANSETRON HCL 4 MG/2ML IJ SOLN: 4.0000 mg | Freq: Four times a day (QID) | INTRAMUSCULAR | Status: DC | PRN

## 2024-06-17 MED ORDER — METOCLOPRAMIDE HCL 5 MG/ML IJ SOLN
10.0000 mg | Freq: Once | INTRAMUSCULAR | Status: AC
  Administered 2024-06-17: 10 mg via INTRAVENOUS
  Filled 2024-06-17: qty 2

## 2024-06-17 NOTE — ED Provider Notes (Signed)
 " [place] EMERGENCY DEPARTMENT AT [facility] Provider Note   CSN: [id number] Arrival date & time: 06/17/24  1357     History {Add pertinent medical, surgical, social history, OB history to HPI:1} Chief Complaint  Patient presents with   Abdominal Pain    Veronica Green is a 44 y.o. female with PMH as listed below who presents with hypokalemia, QT prolongation, hyperemesis presents to emergency room today with complaint of 2 days of nausea vomiting and generalized abdominal cramping. Was seen earlier today in the ED, received IV zofran  and felt better, then went home and began vomiting again. Seen 2 days ago for similar complaint. No hematemesis, fever/chills. Also having abdominal and chest pain. Feels like her body is cramping and thinks she is dehydrated despite receiving 1L IVF earlier today. Uses marijuana daily but stopped in last 2 days. Accompanied by her mother at bedside.  Earlier today, K 3.5, WBC 10.6, POC glucose 99, Cr 0.65, preg negative UA w/ proteinuria and 20 ketones w/ mild hematuria, +THC. Did not have resp panel.     Past Medical History:  Diagnosis Date   Anxiety    Ascites    Cannabis hyperemesis syndrome concurrent with and due to cannabis abuse    Sickle cell trait    Umbilical hernia        Home Medications Prior to Admission medications  Medication Sig Start Date End Date Taking? Authorizing Provider  acetaminophen  (TYLENOL ) 325 MG tablet Take 2 tablets (650 mg total) by mouth every 4 (four) hours as needed (for pain scale < 4). 11/30/20   Antonetta Houston L, CNM  benzocaine -Menthol  (DERMOPLAST) 20-0.5 % AERO Apply 1 application topically as needed for irritation (perineal discomfort). 11/30/20   Antonetta Houston CROME, CNM  Blood Pressure Monitor KIT 1 Device by Does not apply route once a week. To be monitored Regularly at home. 06/06/20   Eveline Lynwood MATSU, MD  docusate sodium  (COLACE) 100 MG capsule Take 1 capsule (100 mg total) by mouth daily.  11/02/20   Fredirick Glenys RAMAN, MD  hydrOXYzine  (ATARAX /VISTARIL ) 25 MG tablet Take 1 tablet (25 mg total) by mouth every 6 (six) hours. 11/01/20   Rasch, Delon I, NP  ibuprofen  (ADVIL ) 400 MG tablet Take 1 tablet (400 mg total) by mouth every 8 (eight) hours as needed for up to 30 doses. 04/05/22   Joesph Shaver Scales, PA-C  lidocaine  (XYLOCAINE ) 2 % solution Use as directed 15 mLs in the mouth or throat every 3 (three) hours as needed for mouth pain (Sore throat). 04/05/22   Joesph Shaver Scales, PA-C  methocarbamol  (ROBAXIN ) 500 MG tablet Take 1 tablet (500 mg total) by mouth 2 (two) times daily. 02/14/23   Lorelle Aleck BROCKS, PA-C  metoCLOPramide  (REGLAN ) 10 MG tablet Take 1 tablet (10 mg total) by mouth every 6 (six) hours. 06/15/24   Odell Balls, PA-C  naproxen  (NAPROSYN ) 500 MG tablet Take 1 tablet (500 mg total) by mouth 2 (two) times daily. 02/14/23   Aberman, Caroline C, PA-C  ondansetron  (ZOFRAN -ODT) 4 MG disintegrating tablet Take 1 tablet (4 mg total) by mouth every 8 (eight) hours as needed for nausea or vomiting. 06/17/24   Barrett, Warren SAILOR, PA-C  pantoprazole  (PROTONIX ) 20 MG tablet Take 1 tablet (20 mg total) by mouth 2 (two) times daily. 11/08/20   Eveline Lynwood MATSU, MD  potassium chloride  SA (KLOR-CON  M) 20 MEQ tablet Take 1 tablet (20 mEq total) by mouth 2 (two) times daily. 06/15/24 06/18/24  Odell Balls, PA-C  Prenatal Vit-Fe Fumarate-FA (PREPLUS) 27-1 MG TABS Take 1 tablet by mouth daily. 06/14/20   Constant, Peggy, MD  witch hazel-glycerin  (TUCKS) pad Apply 1 application topically as needed for hemorrhoids. 11/30/20   Antonetta Edsel CROME, CNM      Allergies    Patient has no known allergies.    Review of Systems   Review of Systems A 10 point review of systems was performed and is negative unless otherwise reported in HPI.  Physical Exam Updated Vital Signs BP (!) 150/112 (BP Location: Right Arm) Comment: patient would not remain still for BP  Pulse 67   Temp 98.3 F (36.8 C)  (Oral)   Resp (!) 22   SpO2 100%  Physical Exam General: Tired and uncomfortable-appearing female, lying in bed.  HEENT: PERRLA, Sclera anicteric, MMM, trachea midline.  Cardiology: RRR, no murmurs/rubs/gallops. BL radial and DP pulses equal bilaterally.  Resp: Normal respiratory rate and effort. CTAB, no wheezes, rhonchi, crackles.  Abd: Soft, non-tender, non-distended. No rebound tenderness or guarding.  GU: Deferred. MSK: No peripheral edema or signs of trauma. Extremities without deformity or TTP.  Skin: warm, dry.  Neuro: A&Ox4, CNs II-XII grossly intact. MAEs. Sensation grossly intact.  Psych: Normal mood and affect.   ED Results / Procedures / Treatments   Labs (all labs ordered are listed, but only abnormal results are displayed) Labs Reviewed  RESP PANEL BY RT-PCR (RSV, FLU A&B, COVID)  [id number]    EKG None  Radiology No results found.  Procedures Procedures  {Document cardiac monitor, telemetry assessment procedure when appropriate:1}  Medications Ordered in ED Medications - No data to display  ED Course/ Medical Decision Making/ A&P                          Medical Decision Making   This patient presents to the ED for concern of ***, this involves an extensive number of treatment options, and is a complaint that carries with it a high risk of complications and morbidity.  I considered the following differential and admission for this acute, potentially life threatening condition.   MDM:    ***     Labs: I personally interpreted labs from earlier today.  The pertinent results include:  as listed above  Additional history obtained from chart review, mother at bedside.   Cardiac Monitoring: The patient was maintained on a cardiac monitor.  I personally viewed and interpreted the cardiac monitored which showed an underlying rhythm of: NSR  Reevaluation: After the interventions noted above, I reevaluated the patient and found that they have  :improved  Social Determinants of Health: Lives independently with her children  Disposition:  ***  Co morbidities that complicate the patient evaluation  Past Medical History:  Diagnosis Date   Anxiety    Ascites    Cannabis hyperemesis syndrome concurrent with and due to cannabis abuse    Sickle cell trait    Umbilical hernia      Medicines No orders of the defined types were placed in this encounter.   I have reviewed the patients home medicines and have made adjustments as needed  Problem List / ED Course: Problem List Items Addressed This Visit   None        {Document critical care time when appropriate:1} {Document review of labs and clinical decision tools ie heart score, Chads2Vasc2 etc:1}  {Document your independent review of radiology images, and any outside records:1} {Document your  discussion with family members, caretakers, and with consultants:1} {Document social determinants of health affecting pt's care:1} {Document your decision making why or why not admission, treatments were needed:1}  This note was created using dictation software, which may contain spelling or grammatical errors.  "

## 2024-06-17 NOTE — ED Notes (Signed)
 Pt drinking soda at this time.  In NAD

## 2024-06-17 NOTE — Discharge Instructions (Addendum)
 Please try Zofran  every 8 hours as needed for nausea and vomiting.  This disintegrates under your tongue, so you don't have to keep the medicine down. If this does not seem to help after 15-30 minutes try the Reglan  that you were sent home with two days ago.   Try primarily water but you can alternate Gatorade and Pedialyte.  Try bland diet. Slowly progress to normal diet after 2 days. Return to ED with new or worsening symptoms.

## 2024-06-17 NOTE — ED Triage Notes (Signed)
 Nausea, vomiting and subjective withdrawal from marijuana as she has not smoked in 2 days since last ED visit.   Attempted antiemetic medications recently prescribed but vomited them up.

## 2024-06-17 NOTE — ED Triage Notes (Signed)
 Pt just seen and Dc'd within the last 2 hours, reports abd pain with n/v, has not improved.

## 2024-06-17 NOTE — ED Notes (Addendum)
 Attempted to get a U/A on pt. Pt unable to go at this time. Pt knows it's needed and stated she will provide one once she's able to.

## 2024-06-17 NOTE — ED Provider Notes (Signed)
 " [facility] AT [facility] Provider Note   CSN: [id number] Arrival date & time: 06/17/24  9970     Patient presents with: Nausea   Veronica Green is a 44 y.o. female.  Patient with past medical history of hypokalemia, QT prolongation, hyperemesis presents to emergency room today with complaint of 2 days of nausea vomiting and generalized abdominal cramping. Thinks secondary to suspicious food intake.  She reports she was seen here 2 days ago for similar thing and initially was feeling better.  She tried to have some soup last night and started to vomit again.  She reports vomiting over 10 times no blood.  Denies any loose stool.  She reports she is starting to feel better now but continues to feel dehydrated.  She denies any chest pain shortness of breath cough or fever. Admits to marijuana but stopped over the last 2 days.    HPI     Prior to Admission medications  Medication Sig Start Date End Date Taking? Authorizing Provider  ondansetron  (ZOFRAN -ODT) 4 MG disintegrating tablet Take 1 tablet (4 mg total) by mouth every 8 (eight) hours as needed for nausea or vomiting. 06/17/24  Yes Zeth Buday, Warren SAILOR, PA-C  acetaminophen  (TYLENOL ) 325 MG tablet Take 2 tablets (650 mg total) by mouth every 4 (four) hours as needed (for pain scale < 4). 11/30/20   Antonetta Edsel CROME, CNM  benzocaine -Menthol  (DERMOPLAST) 20-0.5 % AERO Apply 1 application topically as needed for irritation (perineal discomfort). 11/30/20   Antonetta Edsel CROME, CNM  Blood Pressure Monitor KIT 1 Device by Does not apply route once a week. To be monitored Regularly at home. 06/06/20   Eveline Lynwood MATSU, MD  docusate sodium  (COLACE) 100 MG capsule Take 1 capsule (100 mg total) by mouth daily. 11/02/20   Fredirick Glenys RAMAN, MD  hydrOXYzine  (ATARAX /VISTARIL ) 25 MG tablet Take 1 tablet (25 mg total) by mouth every 6 (six) hours. 11/01/20   Rasch, Delon I, NP  ibuprofen  (ADVIL ) 400 MG tablet Take 1 tablet (400 mg  total) by mouth every 8 (eight) hours as needed for up to 30 doses. 04/05/22   Joesph Shaver Scales, PA-C  lidocaine  (XYLOCAINE ) 2 % solution Use as directed 15 mLs in the mouth or throat every 3 (three) hours as needed for mouth pain (Sore throat). 04/05/22   Joesph Shaver Scales, PA-C  methocarbamol  (ROBAXIN ) 500 MG tablet Take 1 tablet (500 mg total) by mouth 2 (two) times daily. 02/14/23   Aberman, Caroline C, PA-C  metoCLOPramide  (REGLAN ) 10 MG tablet Take 1 tablet (10 mg total) by mouth every 6 (six) hours. 06/15/24   Odell Balls, PA-C  naproxen  (NAPROSYN ) 500 MG tablet Take 1 tablet (500 mg total) by mouth 2 (two) times daily. 02/14/23   Lorelle Aleck BROCKS, PA-C  pantoprazole  (PROTONIX ) 20 MG tablet Take 1 tablet (20 mg total) by mouth 2 (two) times daily. 11/08/20   Eveline Lynwood MATSU, MD  potassium chloride  SA (KLOR-CON  M) 20 MEQ tablet Take 1 tablet (20 mEq total) by mouth 2 (two) times daily. 06/15/24 06/18/24  Odell Balls, PA-C  Prenatal Vit-Fe Fumarate-FA (PREPLUS) 27-1 MG TABS Take 1 tablet by mouth daily. 06/14/20   Constant, Peggy, MD  witch hazel-glycerin  (TUCKS) pad Apply 1 application topically as needed for hemorrhoids. 11/30/20   Antonetta Edsel CROME, CNM    Allergies: Patient has no known allergies.    Review of Systems  Gastrointestinal:  Positive for nausea. Negative for abdominal pain.  Updated Vital Signs BP 101/70 (BP Location: Right Arm)   Pulse (!) 56   Temp 98 F (36.7 C) (Oral)   Resp 16   SpO2 100%   Physical Exam Vitals and nursing note reviewed.  Constitutional:      General: She is not in acute distress.    Appearance: She is not toxic-appearing.  HENT:     Head: Normocephalic and atraumatic.  Eyes:     General: No scleral icterus.    Conjunctiva/sclera: Conjunctivae normal.  Cardiovascular:     Rate and Rhythm: Normal rate and regular rhythm.     Pulses: Normal pulses.     Heart sounds: Normal heart sounds.  Pulmonary:     Effort: Pulmonary  effort is normal. No respiratory distress.     Breath sounds: Normal breath sounds.  Abdominal:     General: Abdomen is flat. Bowel sounds are normal. There is no distension.     Palpations: Abdomen is soft.     Tenderness: There is no abdominal tenderness. There is no right CVA tenderness or left CVA tenderness.     Comments: Denies abdominal pain at this time. Abd soft, non-distended, non-tender.   Musculoskeletal:     Right lower leg: No edema.     Left lower leg: No edema.  Skin:    General: Skin is warm and dry.     Findings: No lesion.  Neurological:     General: No focal deficit present.     Mental Status: She is alert and oriented to person, place, and time. Mental status is at baseline.     (all labs ordered are listed, but only abnormal results are displayed) Labs Reviewed  CBC WITH DIFFERENTIAL/PLATELET - Abnormal; Notable for the following components:      Result Value   WBC 10.6 (*)    MCHC 36.3 (*)    All other components within normal limits  URINE DRUG SCREEN - Abnormal; Notable for the following components:   Tetrahydrocannabinol POSITIVE (*)    All other components within normal limits  URINALYSIS, ROUTINE W REFLEX MICROSCOPIC - Abnormal; Notable for the following components:   APPearance HAZY (*)    Hgb urine dipstick MODERATE (*)    Ketones, ur 20 (*)    Protein, ur 100 (*)    All other components within normal limits  COMPREHENSIVE METABOLIC PANEL WITH GFR  LIPASE, BLOOD  HCG, SERUM, QUALITATIVE    EKG: None  Radiology: No results found.   Procedures   Medications Ordered in the ED  ondansetron  (ZOFRAN ) injection 4 mg (4 mg Intravenous Given 06/17/24 0916)  lactated ringers  bolus 1,000 mL (1,000 mLs Intravenous New Bag/Given 06/17/24 0920)    Clinical Course as of 06/17/24 1045  Sat Jun 17, 2024  9161 Normal QT on EKG today. [JB]  1018 Feeling better after treatment, will try PO challenge. [JB]  1045 Passed PO challenge. Wants d/c. [JB]     Clinical Course User Index [JB] Allecia Bells, Warren SAILOR, PA-C                                 Medical Decision Making Amount and/or Complexity of Data Reviewed Labs: ordered. ECG/medicine tests: ordered.  Risk Prescription drug management.   This patient presents to the ED for concern of nausea, this involves an extensive number of treatment options, and is a complaint that carries with it a high risk of complications and morbidity.  The differential diagnosis includes CHS, SBO, appendicitis, torsion, UTI   Co morbidities that complicate the patient evaluation  QT prolongation CHS   Additional history obtained:  Additional history obtained from seen 06/16/23 for NV and had reassuring abdominal exam, no imaging, lab work showed mild hypokalemia, d/c home w/ reglan .   Lab Tests:  I personally interpreted labs.  The pertinent results include:   Negative pregnancy test  CBC with mild leukocytosis, no anemia. CMP & Lipase WNL    Imaging Studies ordered:  Will hold off on imaging, no abdominal pain and now tolerating oral intake after treatment. Patient agrees to this.    Cardiac Monitoring: / EKG:  The patient was maintained on a cardiac monitor. EKG ordered to evaluation for QT prolongation.  I personally viewed and interpreted the cardiac monitored which showed an underlying rhythm of: sinus with no QT prolongation.    Problem List / ED Course / Critical interventions / Medication management  Patient is presenting to emergency room with complaint of nausea and vomiting. She does have hx of similar and diagnosis of CHS, she thinks this particular episode is due to suspicious food intake.  Patient reports that this started 2 days ago.  On arrival to emergency room she is hemodynamically stable, slightly tachycardic.  She complains of abdominal pain when vomiting but this is now resolved on its own.  On my exam she has no focal area of abdominal tenderness, no rebound tenderness.   Her lab work is overall reassuring.  Will obtain EKG to evaluate for QT prolongation before giving antiemetics.  Given that she is starting to feel better will treat with antiemetics, and fluids, will hold off on imaging at this time.  Will p.o. challenge and reassess. Labs are overall reassuring, passed PO challenge.  I ordered medication including IV zofran , NS Reevaluation of the patient after these medicines showed that the patient improved I have reviewed the patients home medicines and have made adjustments as needed. Patients vitals are stable and well appearing.  Feels improved after treatment.  Tolerates oral intake.  Feel appropriately screened for discharge not requiring admission at this time.  Given return precautions and follow-up instructions.        Final diagnoses:  Nausea and vomiting, unspecified vomiting type    ED Discharge Orders          Ordered    ondansetron  (ZOFRAN -ODT) 4 MG disintegrating tablet  Every 8 hours PRN        06/17/24 1026               Author [place], Warren SAILOR, PA-C 06/17/24 1046    Neysa Caron PARAS, DO 06/17/24 1447  "

## 2024-06-18 ENCOUNTER — Encounter (HOSPITAL_COMMUNITY): Payer: Self-pay | Admitting: Family Medicine

## 2024-06-18 DIAGNOSIS — R1110 Vomiting, unspecified: Secondary | ICD-10-CM

## 2024-06-18 LAB — BASIC METABOLIC PANEL WITH GFR
Anion gap: 7 (ref 5–15)
BUN: 7 mg/dL (ref 6–20)
CO2: 27 mmol/L (ref 22–32)
Calcium: 8.3 mg/dL — ABNORMAL LOW (ref 8.9–10.3)
Chloride: 106 mmol/L (ref 98–111)
Creatinine, Ser: 0.71 mg/dL (ref 0.44–1.00)
GFR, Estimated: >60 mL/min
Glucose, Bld: 107 mg/dL — ABNORMAL HIGH (ref 70–99)
Potassium: 3.6 mmol/L (ref 3.5–5.1)
Sodium: 140 mmol/L (ref 135–145)

## 2024-06-18 LAB — CBC
HCT: 32.8 % — ABNORMAL LOW (ref 36.0–46.0)
Hemoglobin: 11.9 g/dL — ABNORMAL LOW (ref 12.0–15.0)
MCH: 31.6 pg (ref 26.0–34.0)
MCHC: 36.3 g/dL — ABNORMAL HIGH (ref 30.0–36.0)
MCV: 87 fL (ref 80.0–100.0)
Platelets: 268 K/uL (ref 150–400)
RBC: 3.77 MIL/uL — ABNORMAL LOW (ref 3.87–5.11)
RDW: 12.4 % (ref 11.5–15.5)
WBC: 8.8 K/uL (ref 4.0–10.5)
nRBC: 0 % (ref 0.0–0.2)

## 2024-06-18 LAB — HIV ANTIBODY (ROUTINE TESTING W REFLEX): HIV Screen 4th Generation wRfx: NONREACTIVE

## 2024-06-18 MED ORDER — BOOST / RESOURCE BREEZE PO LIQD CUSTOM
1.0000 | Freq: Three times a day (TID) | ORAL | Status: DC
  Administered 2024-06-18 (×2): 1 via ORAL
  Administered 2024-06-19: 237 mL via ORAL

## 2024-06-18 MED ORDER — ORAL CARE MOUTH RINSE: 15.0000 mL | OROMUCOSAL | Status: DC | PRN

## 2024-06-18 NOTE — Assessment & Plan Note (Signed)
 Likely viral gastritis or food infection.  Giving IV fluids, IV antiemetics.  Her most recent EKG showed normal QTc. - Continue Zofran .  Scopolamine  patch added. - Continue IV fluids-D5 half-normal saline with 10 mEq K - Clear liquid diet.  Advance as tolerated.

## 2024-06-18 NOTE — Plan of Care (Signed)

## 2024-06-18 NOTE — Progress Notes (Signed)
 No charge note  Patient seen and examined this morning, just admitted earlier today, H&P reviewed and agree with the assessment and plan  In brief, this is a 44 year old female who has been having GI upset with nausea, vomiting since Wednesday.  Came to the emergency room couple of times, felt better and then went home, where she got sick again.  Given symptom recurrence decided to come to the emergency room.  She was admitted to the hospital.  She tells me today that she ate something that did not sit well with her.  Her friend who ate similar food was also sick, had vomiting but his symptoms resolved.  Will start with clear liquids today, advance to full as tolerated.  Anticipate home discharge perhaps on Monday if she is able to tolerate advancing her diet  Maryjo Ragon M. Trixie, MD, PhD [facility]  Between 7 am - 7 pm you can contact me via Amion (for emergencies) or Securechat (non urgent matters).  I am not available 7 pm - 7 am, please contact night coverage MD/APP via Amion

## 2024-06-18 NOTE — H&P (Signed)
 " History and Physical    Patient: Veronica Green FMW:[id number] DOB: [birth year] DOA: 06/17/2024 DOS: the patient was seen and examined on 06/18/2024 PCP: Patient, No Pcp Per  Patient coming from: Home  Chief Complaint:  Chief Complaint  Patient presents with   Abdominal Pain   HPI: Veronica Green is a 44 y.o. female with no significant past medical history    Presents with vomiting since Wednesday, 06/15/2024. Initially improved with medication but returned with persistent vomiting. Unable to tolerate oral intake.  Patient was in the emergency department on 1/8, and again earlier this morning at 1/10.  Was discharged from the ED in the morning but subsequently came back after vomiting returned.  - Symptom characteristics: Vomiting is described as clear, yellowish liquid with some Stoneman material, suggestive of stomach lining. Reports nausea and a mild headache. No significant abdominal pain at rest, but experiences cramping with emesis. - Symptom modifiers or self-management: Attempted Zofran  at home with minimal effect. Unable to keep water down. Nausea is exacerbated by food smells. - Symptom progression: Symptoms began after eating out on Wednesday. Initially presented to the hospital on Wednesday night, was discharged, but symptoms recurred upon waking, leading to a return visit via ambulance. - Associated symptoms: Reports one episode of loose stool. No fever, cough, or runny nose.  Review of Systems: As mentioned in the history of present illness. All other systems reviewed and are negative. Past Medical History:  Diagnosis Date   Anxiety    Ascites    Cannabis hyperemesis syndrome concurrent with and due to cannabis abuse    Sickle cell trait    Umbilical hernia    Past Surgical History:  Procedure Laterality Date   DILATION AND CURETTAGE OF UTERUS     DILATION AND EVACUATION N/A 04/17/2019   Procedure: SUCTION DILATATION AND EVACUATION;  Surgeon: Izell Harari, MD;  Location: [facility]  OR;  Service: Gynecology;  Laterality: N/A;   OPERATIVE ULTRASOUND N/A 04/17/2019   Procedure: OPERATIVE ULTRASOUND;  Surgeon: Izell Harari, MD;  Location: [facility] OR;  Service: Gynecology;  Laterality: N/A;   RIGHT OOPHORECTOMY  07/2016   diagnostic laparoscopy converted to ex lap w/ umbilical hernia repair @ [facility] (benign)   UMBILICAL HERNIA REPAIR  07/2016   [facility]   Social History:  reports that she quit smoking about 8 years ago. Her smoking use included cigarettes. She smoked an average of 0.5 packs per day. She has never used smokeless tobacco. She reports current drug use. Drug: Marijuana. She reports that she does not drink alcohol.  Allergies[1]  Family History  Problem Relation Age of Onset   Cancer Maternal Grandmother    Cancer Paternal Grandmother    Asthma Mother    Multiple sclerosis Sister    Healthy Father    Anesthesia problems Neg Hx    Hypotension Neg Hx    Malignant hyperthermia Neg Hx    Pseudochol deficiency Neg Hx     Prior to Admission medications  Medication Sig Start Date End Date Taking? Authorizing Provider  acetaminophen  (TYLENOL ) 325 MG tablet Take 2 tablets (650 mg total) by mouth every 4 (four) hours as needed (for pain scale < 4). 11/30/20   Antonetta Edsel CROME, CNM  benzocaine -Menthol  (DERMOPLAST) 20-0.5 % AERO Apply 1 application topically as needed for irritation (perineal discomfort). 11/30/20   Antonetta Edsel CROME, CNM  Blood Pressure Monitor KIT 1 Device by Does not apply route once a week. To be monitored Regularly at home. 06/06/20   Eveline Agent  G, MD  docusate sodium  (COLACE) 100 MG capsule Take 1 capsule (100 mg total) by mouth daily. 11/02/20   Fredirick Glenys RAMAN, MD  hydrOXYzine  (ATARAX /VISTARIL ) 25 MG tablet Take 1 tablet (25 mg total) by mouth every 6 (six) hours. 11/01/20   Rasch, Delon I, NP  ibuprofen  (ADVIL ) 400 MG tablet Take 1 tablet (400 mg total) by mouth every 8 (eight) hours as needed for up to 30 doses. 04/05/22   Joesph Shaver  Scales, PA-C  lidocaine  (XYLOCAINE ) 2 % solution Use as directed 15 mLs in the mouth or throat every 3 (three) hours as needed for mouth pain (Sore throat). 04/05/22   Joesph Shaver Scales, PA-C  methocarbamol  (ROBAXIN ) 500 MG tablet Take 1 tablet (500 mg total) by mouth 2 (two) times daily. 02/14/23   Aberman, Caroline C, PA-C  metoCLOPramide  (REGLAN ) 10 MG tablet Take 1 tablet (10 mg total) by mouth every 6 (six) hours. 06/15/24   Odell Balls, PA-C  naproxen  (NAPROSYN ) 500 MG tablet Take 1 tablet (500 mg total) by mouth 2 (two) times daily. 02/14/23   Aberman, Caroline C, PA-C  ondansetron  (ZOFRAN -ODT) 4 MG disintegrating tablet Take 1 tablet (4 mg total) by mouth every 8 (eight) hours as needed for nausea or vomiting. 06/17/24   Barrett, Jamie N, PA-C  pantoprazole  (PROTONIX ) 20 MG tablet Take 1 tablet (20 mg total) by mouth 2 (two) times daily. 11/08/20   Eveline Lynwood MATSU, MD  potassium chloride  SA (KLOR-CON  M) 20 MEQ tablet Take 1 tablet (20 mEq total) by mouth 2 (two) times daily. 06/15/24 06/18/24  Odell Balls, PA-C  Prenatal Vit-Fe Fumarate-FA (PREPLUS) 27-1 MG TABS Take 1 tablet by mouth daily. 06/14/20   Constant, Peggy, MD  witch hazel-glycerin  (TUCKS) pad Apply 1 application topically as needed for hemorrhoids. 11/30/20   Antonetta Edsel CROME, CNM    Physical Exam: Vitals:   06/17/24 2115 06/17/24 2130 06/17/24 2321 06/17/24 2339  BP: 103/64 116/72 110/77   Pulse: (!) 58 (!) 50 (!) 52   Resp: 20 17 16    Temp:   98.4 F (36.9 C)   TempSrc:   Oral   SpO2: 99% 99% 100%   Weight:    43.5 kg  Height:    5' 3 (1.6 m)  General: Alert, oriented. HEENT: PERRLA, EOMI, moist mucosal membrane. CV: Regular rate and rhythm Pulmonary: Lungs clear bilaterally GI: Soft, nontender.  Normal bowel sounds. Extremities: No pedal edema MSK: Strength equal bilaterally Skin: Warm and dry Psych: Pleasant   Data Reviewed:  There are no new results to review at this time.  Assessment and Plan: No  notes have been filed under this hospital service. Service: Hospitalist  Intractable nausea and vomiting Likely viral gastritis or food infection.  Giving IV fluids, IV antiemetics.  Her most recent EKG showed normal QTc. - Continue Zofran .  Scopolamine  patch added. - Continue IV fluids-D5 half-normal saline with 10 mEq K - Clear liquid diet.  Advance as tolerated.    Advance Care Planning:   Code Status: Full Code    Consults: None  Family Communication: Mother, Reegan Mctighe, is patient's preferred contact  Severity of Illness: The appropriate patient status for this patient is OBSERVATION. Observation status is judged to be reasonable and necessary in order to provide the required intensity of service to ensure the patient's safety. The patient's presenting symptoms, physical exam findings, and initial radiographic and laboratory data in the context of their medical condition is felt to place them at decreased  risk for further clinical deterioration. Furthermore, it is anticipated that the patient will be medically stable for discharge from the hospital within 2 midnights of admission.   Author: Toribio MARLA Slain, MD 06/18/2024 2:46 AM  For on call review [email or web address].     [1] No Known Allergies  "

## 2024-06-19 DIAGNOSIS — R1110 Vomiting, unspecified: Secondary | ICD-10-CM | POA: Diagnosis not present

## 2024-06-19 NOTE — Discharge Summary (Signed)
 Physician Discharge Summary  Veronica Green FMW:[id number] DOB: [birth year] DOA: 06/17/2024  PCP: Patient, No Pcp Per  Admit date: 06/17/2024 Discharge date: 06/19/2024  Admitted From: Home  Disposition: Home  Recommendations for Outpatient Follow-up:  Follow up with PCP in 1-2 weeks Please obtain BMP/CBC in one week   Discharge Condition: Stable CODE STATUS: Full code Diet recommendation: Regular diet, soft diet thin liquids.  Discharge summary: 44 year old no significant medical history had about 4 days of persistent nausea and intolerance to diet started after eating something that her other company and was also sick with.  She had few episodes of loose bowel movements.  Symptoms are persistent show she was admitted overnight for observation.  Today, she is without any symptoms.  She ate 2 regular meals without need for any nausea medications.  Remains largely stable.  Advised discharge with as needed nausea medications.  Discharge Diagnoses:  Principal Problem:   Intractable vomiting    Discharge Instructions  Discharge Instructions     Diet general   Complete by: As directed    Soft diet and liquids   Increase activity slowly   Complete by: As directed       Allergies as of 06/19/2024   No Known Allergies      Medication List     STOP taking these medications    potassium chloride  SA 20 MEQ tablet Commonly known as: KLOR-CON  M       TAKE these medications    Blood Pressure Monitor Kit 1 Device by Does not apply route once a week. To be monitored Regularly at home.   metoCLOPramide  10 MG tablet Commonly known as: REGLAN  Take 1 tablet (10 mg total) by mouth every 6 (six) hours.        Allergies[1]  Consultations: None   Procedures/Studies: No results found. (Echo, Carotid, EGD, Colonoscopy, ERCP)    Subjective: Patient seen in the morning rounds.  Denies any complaints.  She did not require any nausea medicine overnight.  She ate her breakfast  and then a lunch without any issues.  Agreeable and comfortable with plan to go home.   Discharge Exam: Vitals:   06/18/24 2142 06/19/24 0506  BP: (!) 102/56 (!) 111/59  Pulse: (!) 52 (!) 47  Resp: 18 18  Temp: 98.9 F (37.2 C) 98.3 F (36.8 C)  SpO2: 100% 100%   Vitals:   06/18/24 1326 06/18/24 1907 06/18/24 2142 06/19/24 0506  BP: 102/68 121/80 (!) 102/56 (!) 111/59  Pulse: (!) 56 (!) 107 (!) 52 (!) 47  Resp: 18 18 18 18   Temp: 98.6 F (37 C) 99.1 F (37.3 C) 98.9 F (37.2 C) 98.3 F (36.8 C)  TempSrc: Oral Oral Oral Oral  SpO2: 100% 100% 100% 100%  Weight:      Height:        General: Pt is alert, awake, not in acute distress Cardiovascular: RRR, S1/S2 +, no rubs, no gallops Respiratory: CTA bilaterally, no wheezing, no rhonchi Abdominal: Soft, NT, ND, bowel sounds + Extremities: no edema, no cyanosis    The results of significant diagnostics from this hospitalization (including imaging, microbiology, ancillary and laboratory) are listed below for reference.     Microbiology: Recent Results (from the past 240 hours)  Resp panel by RT-PCR (RSV, Flu A&B, Covid) Anterior Nasal Swab     Status: None   Collection Time: 06/17/24  5:30 PM   Specimen: Anterior Nasal Swab  Result Value Ref Range Status   SARS Coronavirus 2 by  RT PCR NEGATIVE NEGATIVE Final    Comment: (NOTE) SARS-CoV-2 target nucleic acids are NOT DETECTED.  The SARS-CoV-2 RNA is generally detectable in upper respiratory specimens during the acute phase of infection. The lowest concentration of SARS-CoV-2 viral copies this assay can detect is 138 copies/mL. A negative result does not preclude SARS-Cov-2 infection and should not be used as the sole basis for treatment or other patient management decisions. A negative result may occur with  improper specimen collection/handling, submission of specimen other than nasopharyngeal swab, presence of viral mutation(s) within the areas targeted by this  assay, and inadequate number of viral copies(<138 copies/mL). A negative result must be combined with clinical observations, patient history, and epidemiological information. The expected result is Negative.  Fact Sheet for Patients:  bloggercourse.com  Fact Sheet for Healthcare Providers:  seriousbroker.it  This test is no t yet approved or cleared by the [place]  FDA and  has been authorized for detection and/or diagnosis of SARS-CoV-2 by FDA under an Emergency Use Authorization (EUA). This EUA will remain  in effect (meaning this test can be used) for the duration of the COVID-19 declaration under Section 564(b)(1) of the Act, 21 U.S.C.section 360bbb-3(b)(1), unless the authorization is terminated  or revoked sooner.       Influenza A by PCR NEGATIVE NEGATIVE Final   Influenza B by PCR NEGATIVE NEGATIVE Final    Comment: (NOTE) The Xpert Xpress SARS-CoV-2/FLU/RSV plus assay is intended as an aid in the diagnosis of influenza from Nasopharyngeal swab specimens and should not be used as a sole basis for treatment. Nasal washings and aspirates are unacceptable for Xpert Xpress SARS-CoV-2/FLU/RSV testing.  Fact Sheet for Patients: bloggercourse.com  Fact Sheet for Healthcare Providers: seriousbroker.it  This test is not yet approved or cleared by the [place]  FDA and has been authorized for detection and/or diagnosis of SARS-CoV-2 by FDA under an Emergency Use Authorization (EUA). This EUA will remain in effect (meaning this test can be used) for the duration of the COVID-19 declaration under Section 564(b)(1) of the Act, 21 U.S.C. section 360bbb-3(b)(1), unless the authorization is terminated or revoked.     Resp Syncytial Virus by PCR NEGATIVE NEGATIVE Final    Comment: (NOTE) Fact Sheet for Patients: bloggercourse.com  Fact Sheet for  Healthcare Providers: seriousbroker.it  This test is not yet approved or cleared by the [place]  FDA and has been authorized for detection and/or diagnosis of SARS-CoV-2 by FDA under an Emergency Use Authorization (EUA). This EUA will remain in effect (meaning this test can be used) for the duration of the COVID-19 declaration under Section 564(b)(1) of the Act, 21 U.S.C. section 360bbb-3(b)(1), unless the authorization is terminated or revoked.  Performed at [facility], [address], [place], [place] [address]      Labs: BNP (last 3 results) No results for input(s): BNP in the last 8760 hours. Basic Metabolic Panel: Recent Labs  Lab 06/15/24 0339 06/17/24 0731 06/18/24 0439  NA 141 142 140  K 3.2* 3.5 3.6  CL 105 104 106  CO2 26 26 27   GLUCOSE 165* 99 107*  BUN 9 11 7   CREATININE 0.65 0.65 0.71  CALCIUM  9.7 9.4 8.3*   Liver Function Tests: Recent Labs  Lab 06/15/24 0339 06/17/24 0731  AST 36 33  ALT 31 43  ALKPHOS 65 55  BILITOT 0.4 0.7  PROT 7.4 7.6  ALBUMIN 4.5 4.6   Recent Labs  Lab 06/15/24 0339 06/17/24 0731  LIPASE 18 19  No results for input(s): AMMONIA in the last 168 hours. CBC: Recent Labs  Lab 06/15/24 0339 06/17/24 0731 06/18/24 0439  WBC 12.7* 10.6* 8.8  NEUTROABS  --  7.0  --   HGB 13.3 13.2 11.9*  HCT 36.2 36.4 32.8*  MCV 86.6 86.1 87.0  PLT 373 336 268   Cardiac Enzymes: No results for input(s): CKTOTAL, CKMB, CKMBINDEX, TROPONINI in the last 168 hours. BNP: Invalid input(s): POCBNP CBG: No results for input(s): GLUCAP in the last 168 hours. D-Dimer No results for input(s): DDIMER in the last 72 hours. Hgb A1c No results for input(s): HGBA1C in the last 72 hours. Lipid Profile No results for input(s): CHOL, HDL, LDLCALC, TRIG, CHOLHDL, LDLDIRECT in the last 72 hours. Thyroid function studies No results for input(s): TSH, T4TOTAL,  T3FREE, THYROIDAB in the last 72 hours.  Invalid input(s): FREET3 Anemia work up No results for input(s): VITAMINB12, FOLATE, FERRITIN, TIBC, IRON, RETICCTPCT in the last 72 hours. Urinalysis    Component Value Date/Time   COLORURINE YELLOW 06/17/2024 0905   APPEARANCEUR HAZY (A) 06/17/2024 0905   LABSPEC 1.028 06/17/2024 0905   PHURINE 5.0 06/17/2024 0905   GLUCOSEU NEGATIVE 06/17/2024 0905   HGBUR MODERATE (A) 06/17/2024 0905   BILIRUBINUR NEGATIVE 06/17/2024 0905   KETONESUR 20 (A) 06/17/2024 0905   PROTEINUR 100 (A) 06/17/2024 0905   UROBILINOGEN 1.0 04/12/2019 1023   NITRITE NEGATIVE 06/17/2024 0905   LEUKOCYTESUR NEGATIVE 06/17/2024 0905   Sepsis Labs Recent Labs  Lab 06/15/24 0339 06/17/24 0731 06/18/24 0439  WBC 12.7* 10.6* 8.8   Microbiology Recent Results (from the past 240 hours)  Resp panel by RT-PCR (RSV, Flu A&B, Covid) Anterior Nasal Swab     Status: None   Collection Time: 06/17/24  5:30 PM   Specimen: Anterior Nasal Swab  Result Value Ref Range Status   SARS Coronavirus 2 by RT PCR NEGATIVE NEGATIVE Final    Comment: (NOTE) SARS-CoV-2 target nucleic acids are NOT DETECTED.  The SARS-CoV-2 RNA is generally detectable in upper respiratory specimens during the acute phase of infection. The lowest concentration of SARS-CoV-2 viral copies this assay can detect is 138 copies/mL. A negative result does not preclude SARS-Cov-2 infection and should not be used as the sole basis for treatment or other patient management decisions. A negative result may occur with  improper specimen collection/handling, submission of specimen other than nasopharyngeal swab, presence of viral mutation(s) within the areas targeted by this assay, and inadequate number of viral copies(<138 copies/mL). A negative result must be combined with clinical observations, patient history, and epidemiological information. The expected result is Negative.  Fact Sheet for  Patients:  bloggercourse.com  Fact Sheet for Healthcare Providers:  seriousbroker.it  This test is no t yet approved or cleared by the [place]  FDA and  has been authorized for detection and/or diagnosis of SARS-CoV-2 by FDA under an Emergency Use Authorization (EUA). This EUA will remain  in effect (meaning this test can be used) for the duration of the COVID-19 declaration under Section 564(b)(1) of the Act, 21 U.S.C.section 360bbb-3(b)(1), unless the authorization is terminated  or revoked sooner.       Influenza A by PCR NEGATIVE NEGATIVE Final   Influenza B by PCR NEGATIVE NEGATIVE Final    Comment: (NOTE) The Xpert Xpress SARS-CoV-2/FLU/RSV plus assay is intended as an aid in the diagnosis of influenza from Nasopharyngeal swab specimens and should not be used as a sole basis for treatment. Nasal washings and aspirates are unacceptable  for Xpert Xpress SARS-CoV-2/FLU/RSV testing.  Fact Sheet for Patients: bloggercourse.com  Fact Sheet for Healthcare Providers: seriousbroker.it  This test is not yet approved or cleared by the [place]  FDA and has been authorized for detection and/or diagnosis of SARS-CoV-2 by FDA under an Emergency Use Authorization (EUA). This EUA will remain in effect (meaning this test can be used) for the duration of the COVID-19 declaration under Section 564(b)(1) of the Act, 21 U.S.C. section 360bbb-3(b)(1), unless the authorization is terminated or revoked.     Resp Syncytial Virus by PCR NEGATIVE NEGATIVE Final    Comment: (NOTE) Fact Sheet for Patients: bloggercourse.com  Fact Sheet for Healthcare Providers: seriousbroker.it  This test is not yet approved or cleared by the [place]  FDA and has been authorized for detection and/or diagnosis of SARS-CoV-2 by FDA under an Emergency  Use Authorization (EUA). This EUA will remain in effect (meaning this test can be used) for the duration of the COVID-19 declaration under Section 564(b)(1) of the Act, 21 U.S.C. section 360bbb-3(b)(1), unless the authorization is terminated or revoked.  Performed at [facility], [address] [place], [place] [address]      Time coordinating discharge: 35 minutes  SIGNED:   Renato Applebaum, MD  [facility] 06/19/2024, 1:12 PM     [1] No Known Allergies

## 2024-06-19 NOTE — Progress Notes (Signed)
 AVS reviewed with patient who verbalized an understanding. PIV removed as noted. Return to work note provided to patient. Patient dressed for d/c to home. This RN educated patient on how to purchase the correct size cuff for her arm.No other questions at this time.

## 2024-06-19 NOTE — Plan of Care (Signed)
   Problem: Education: Goal: Knowledge of General Education information will improve Description: Including pain rating scale, medication(s)/side effects and non-pharmacologic comfort measures Outcome: Progressing   Problem: Health Behavior/Discharge Planning: Goal: Ability to manage health-related needs will improve Outcome: Progressing   Problem: Clinical Measurements: Goal: Will remain free from infection Outcome: Progressing

## 2024-06-19 NOTE — Plan of Care (Signed)
